# Patient Record
Sex: Male | Born: 1970 | Race: White | Hispanic: No | Marital: Married | Smoking: Current every day smoker
Health system: Southern US, Community
[De-identification: ages and names within clinical notes are randomized; demographics above are authoritative.]

## PROBLEM LIST (undated history)

## (undated) DIAGNOSIS — G4730 Sleep apnea, unspecified: Secondary | ICD-10-CM

## (undated) DIAGNOSIS — I219 Acute myocardial infarction, unspecified: Secondary | ICD-10-CM

## (undated) DIAGNOSIS — I10 Essential (primary) hypertension: Secondary | ICD-10-CM

## (undated) DIAGNOSIS — I509 Heart failure, unspecified: Secondary | ICD-10-CM

## (undated) DIAGNOSIS — E785 Hyperlipidemia, unspecified: Secondary | ICD-10-CM

## (undated) DIAGNOSIS — N529 Male erectile dysfunction, unspecified: Secondary | ICD-10-CM

## (undated) DIAGNOSIS — I2510 Atherosclerotic heart disease of native coronary artery without angina pectoris: Secondary | ICD-10-CM

## (undated) HISTORY — PX: CARDIAC CATHETERIZATION: SHX172

## (undated) HISTORY — DX: Heart failure, unspecified: I50.9

## (undated) HISTORY — DX: Sleep apnea, unspecified: G47.30

## (undated) HISTORY — DX: Atherosclerotic heart disease of native coronary artery without angina pectoris: I25.10

## (undated) HISTORY — DX: Essential (primary) hypertension: I10

## (undated) HISTORY — DX: Morbid (severe) obesity due to excess calories: E66.01

## (undated) HISTORY — DX: Male erectile dysfunction, unspecified: N52.9

## (undated) HISTORY — DX: Acute myocardial infarction, unspecified: I21.9

## (undated) HISTORY — PX: SEPTOPLASTY: SUR1290

## (undated) HISTORY — PX: CHOLECYSTECTOMY: SHX55

## (undated) HISTORY — DX: Hyperlipidemia, unspecified: E78.5

---

## 2003-08-24 ENCOUNTER — Other Ambulatory Visit: Payer: Self-pay

## 2003-11-03 ENCOUNTER — Emergency Department: Payer: Self-pay | Admitting: Emergency Medicine

## 2003-11-03 ENCOUNTER — Other Ambulatory Visit: Payer: Self-pay

## 2003-11-07 ENCOUNTER — Other Ambulatory Visit: Payer: Self-pay

## 2003-11-07 ENCOUNTER — Emergency Department: Payer: Self-pay | Admitting: Emergency Medicine

## 2003-11-08 ENCOUNTER — Ambulatory Visit: Payer: Self-pay | Admitting: Emergency Medicine

## 2003-12-26 ENCOUNTER — Emergency Department: Payer: Self-pay | Admitting: Emergency Medicine

## 2004-01-09 ENCOUNTER — Emergency Department: Payer: Self-pay | Admitting: Emergency Medicine

## 2004-03-07 ENCOUNTER — Emergency Department: Payer: Self-pay | Admitting: Emergency Medicine

## 2004-03-19 ENCOUNTER — Emergency Department: Payer: Self-pay | Admitting: Emergency Medicine

## 2004-03-19 ENCOUNTER — Other Ambulatory Visit: Payer: Self-pay

## 2004-06-25 ENCOUNTER — Other Ambulatory Visit: Payer: Self-pay

## 2004-06-25 ENCOUNTER — Emergency Department: Payer: Self-pay | Admitting: Emergency Medicine

## 2004-09-04 ENCOUNTER — Emergency Department: Payer: Self-pay | Admitting: Emergency Medicine

## 2004-09-04 ENCOUNTER — Other Ambulatory Visit: Payer: Self-pay

## 2004-09-07 ENCOUNTER — Other Ambulatory Visit: Payer: Self-pay

## 2004-09-07 ENCOUNTER — Emergency Department: Payer: Self-pay | Admitting: Emergency Medicine

## 2004-09-10 ENCOUNTER — Ambulatory Visit: Payer: Self-pay | Admitting: General Surgery

## 2004-09-12 ENCOUNTER — Ambulatory Visit: Payer: Self-pay | Admitting: General Surgery

## 2005-03-28 ENCOUNTER — Ambulatory Visit: Payer: Self-pay | Admitting: Cardiology

## 2005-03-28 ENCOUNTER — Inpatient Hospital Stay (HOSPITAL_COMMUNITY): Admission: EM | Admit: 2005-03-28 | Discharge: 2005-03-31 | Payer: Self-pay | Admitting: Emergency Medicine

## 2005-05-09 ENCOUNTER — Ambulatory Visit: Payer: Self-pay | Admitting: Internal Medicine

## 2005-06-10 ENCOUNTER — Ambulatory Visit: Payer: Self-pay | Admitting: Cardiology

## 2005-06-10 ENCOUNTER — Ambulatory Visit: Payer: Self-pay | Admitting: Internal Medicine

## 2005-07-05 ENCOUNTER — Ambulatory Visit: Payer: Self-pay | Admitting: Cardiology

## 2005-08-06 ENCOUNTER — Emergency Department: Payer: Self-pay | Admitting: Emergency Medicine

## 2005-09-12 ENCOUNTER — Ambulatory Visit: Payer: Self-pay | Admitting: Cardiology

## 2006-04-28 ENCOUNTER — Emergency Department: Payer: Self-pay | Admitting: Emergency Medicine

## 2007-02-27 ENCOUNTER — Ambulatory Visit: Payer: Self-pay | Admitting: Cardiology

## 2008-05-19 ENCOUNTER — Ambulatory Visit: Payer: Self-pay | Admitting: Cardiology

## 2008-08-13 DIAGNOSIS — I10 Essential (primary) hypertension: Secondary | ICD-10-CM | POA: Insufficient documentation

## 2008-08-13 DIAGNOSIS — I2510 Atherosclerotic heart disease of native coronary artery without angina pectoris: Secondary | ICD-10-CM | POA: Insufficient documentation

## 2008-08-13 DIAGNOSIS — E785 Hyperlipidemia, unspecified: Secondary | ICD-10-CM | POA: Insufficient documentation

## 2008-08-13 DIAGNOSIS — E669 Obesity, unspecified: Secondary | ICD-10-CM | POA: Insufficient documentation

## 2008-09-27 ENCOUNTER — Ambulatory Visit: Payer: Self-pay

## 2008-09-27 ENCOUNTER — Encounter: Payer: Self-pay | Admitting: Cardiology

## 2008-09-28 ENCOUNTER — Ambulatory Visit: Payer: Self-pay | Admitting: Cardiology

## 2008-09-28 DIAGNOSIS — R0602 Shortness of breath: Secondary | ICD-10-CM | POA: Insufficient documentation

## 2008-09-29 ENCOUNTER — Telehealth: Payer: Self-pay | Admitting: Cardiology

## 2008-10-21 ENCOUNTER — Encounter: Payer: Self-pay | Admitting: Cardiology

## 2008-10-26 ENCOUNTER — Encounter: Payer: Self-pay | Admitting: Cardiology

## 2008-10-26 ENCOUNTER — Ambulatory Visit: Payer: Self-pay | Admitting: Cardiovascular Disease

## 2008-10-28 LAB — CONVERTED CEMR LAB
Albumin: 3.9 g/dL (ref 3.5–5.2)
CO2: 25 meq/L (ref 19–32)
Calcium: 8.5 mg/dL (ref 8.4–10.5)
Chloride: 103 meq/L (ref 96–112)
Cholesterol: 123 mg/dL (ref 0–200)
Creatinine, Ser: 0.78 mg/dL (ref 0.40–1.50)
Glucose, Bld: 99 mg/dL (ref 70–99)
LDL Cholesterol: 72 mg/dL (ref 0–99)
Potassium: 4.9 meq/L (ref 3.5–5.3)
Total CHOL/HDL Ratio: 4.2
VLDL: 22 mg/dL (ref 0–40)

## 2009-05-29 ENCOUNTER — Encounter: Payer: Self-pay | Admitting: Cardiology

## 2009-10-12 ENCOUNTER — Telehealth: Payer: Self-pay | Admitting: Cardiology

## 2009-10-12 ENCOUNTER — Encounter: Payer: Self-pay | Admitting: Cardiology

## 2009-10-12 ENCOUNTER — Ambulatory Visit: Payer: Self-pay | Admitting: Cardiology

## 2009-10-13 ENCOUNTER — Ambulatory Visit: Payer: Self-pay | Admitting: Cardiology

## 2009-10-16 ENCOUNTER — Encounter: Payer: Self-pay | Admitting: Cardiology

## 2009-10-16 LAB — CONVERTED CEMR LAB
ALT: 44 units/L (ref 0–53)
Albumin: 4.1 g/dL (ref 3.5–5.2)
CO2: 22 meq/L (ref 19–32)
Calcium: 9.1 mg/dL (ref 8.4–10.5)
Glucose, Bld: 235 mg/dL — ABNORMAL HIGH (ref 70–99)
Potassium: 4.8 meq/L (ref 3.5–5.3)
Pro B Natriuretic peptide (BNP): 7.1 pg/mL (ref 0.0–100.0)
Sodium: 133 meq/L — ABNORMAL LOW (ref 135–145)
Total Bilirubin: 0.4 mg/dL (ref 0.3–1.2)
Total CHOL/HDL Ratio: 3.8

## 2009-10-18 ENCOUNTER — Ambulatory Visit: Payer: Medicare Other | Admitting: Family Medicine

## 2009-10-19 ENCOUNTER — Telehealth: Payer: Self-pay | Admitting: Cardiology

## 2009-10-26 ENCOUNTER — Ambulatory Visit: Payer: Medicare Other | Admitting: Gastroenterology

## 2009-11-15 ENCOUNTER — Encounter: Payer: Self-pay | Admitting: Cardiology

## 2009-11-20 ENCOUNTER — Telehealth: Payer: Self-pay | Admitting: Cardiovascular Disease

## 2009-11-21 ENCOUNTER — Telehealth: Payer: Self-pay | Admitting: Cardiology

## 2009-11-23 ENCOUNTER — Ambulatory Visit: Payer: Medicare Other | Admitting: Gastroenterology

## 2009-11-27 ENCOUNTER — Ambulatory Visit: Payer: Medicare Other | Admitting: Gastroenterology

## 2009-12-07 ENCOUNTER — Encounter: Payer: Self-pay | Admitting: Cardiovascular Disease

## 2009-12-12 ENCOUNTER — Ambulatory Visit: Payer: Self-pay | Admitting: Cardiology

## 2009-12-12 ENCOUNTER — Emergency Department: Payer: Medicare Other

## 2009-12-12 ENCOUNTER — Encounter: Payer: Self-pay | Admitting: Cardiology

## 2009-12-12 ENCOUNTER — Ambulatory Visit: Payer: Self-pay

## 2009-12-28 LAB — CONVERTED CEMR LAB
BUN: 9 mg/dL
CO2: 25 meq/L
Calcium: 9 mg/dL
Chloride: 102 meq/L
Creatinine, Ser: 0.67 mg/dL
Glucose, Bld: 155 mg/dL — ABNORMAL HIGH
Potassium: 4.6 meq/L
Sodium: 136 meq/L

## 2010-02-11 ENCOUNTER — Encounter: Payer: Self-pay | Admitting: Cardiology

## 2010-02-20 NOTE — Progress Notes (Signed)
Summary: MEDICATIONS  Phone Note Call from Patient Call back at [phone]   Caller: WIFE Call For: [facility] Summary of Call: PRIMARY DR WANTS PT TO STOP HYZZAR (?) AND ONLY TAKE LISINOPRIL Initial call taken by: Harlon Flor,  November 21, 2009 3:54 PM  Follow-up for Phone Call        Called and spoke with pt he could not give me any details of why [facility] wanted to stop hyzaar.  Spoke with Dr. Eldred Manges nurse at [facility] on 10/27 pt's BP was 152/89 and Hyzaar was not on pt's med list so at that visit pt recieved a prescription for lisinopril-HCTZ 10/12.5. Pharmancy called clinic to let them know the pt was already taking Hyzaar. Please advise what medications you want the pt to take.  Follow-up by: Benedict Needy, RN,  November 22, 2009 10:24 AM     Appended Document: MEDICATIONS Patient should take Coreg 25 mg two times a day and would increase his losartan/HCTZ (Hyzaar) to 100/25 mg daily (he was on 50/12.5 when I saw him in the office last).  He will need BMET and BP check in 2 wks.  Would encourage him to get his cardiac meds through our office.   Appended Document: MEDICATIONS LMOM TCB  Appended Document: MEDICATIONS Called spoke with pt advised per Dr Shirlee Latch to increase Hyzaar to 100/50mg  once daily, and to continue on Coreg 25mg  two times a day. Made appt for 12/12/09 for 2 week BMP and BP check.  Pt aware of appt and medication changes. Cloyde Reams RN  November 28, 2009 10:23 AM    Clinical Lists Changes  Medications: Changed medication from HYZAAR 50-12.5 MG TABS (LOSARTAN POTASSIUM-HCTZ) 1 tab by mouth daily to HYZAAR 100-25 MG TABS (LOSARTAN POTASSIUM-HCTZ) Take 1 tablet by mouth once a day - Signed Rx of HYZAAR 100-25 MG TABS (LOSARTAN POTASSIUM-HCTZ) Take 1 tablet by mouth once a day;  #30 x 6;  Signed;  Entered by: Cloyde Reams RN;  Authorized by: Marca Ancona, MD;  Method used: Electronically to [facility] [address] [place], [place], [place]  [address], Ph: [phone], Fax: [phone]    Prescriptions: HYZAAR 100-25 MG TABS (LOSARTAN POTASSIUM-HCTZ) Take 1 tablet by mouth once a day  #30 x 6   Entered by:   Cloyde Reams RN   Authorized by:   Marca Ancona, MD   Signed by:   Cloyde Reams RN on 11/28/2009   Method used:   Electronically to        [facility] [address] (retail)       [address]       [place], [place]  [address]       Ph: [phone]       Fax: [phone]   RxID:   [id number]

## 2010-02-20 NOTE — Assessment & Plan Note (Signed)
Summary: [facility]   Visit Type:  Follow-up Primary Provider:  Darreld Mclean, [facility]  CC:  Scheduled for colonoscopy & endoscopy for October 6 with [facility]  Patient has shortness of breath and chest pain when lying flat.Marland Kitchen  History of Present Illness: 40 yo with history of CAD s/p inferior MI, morbid obesity, and smoking presents for followup.  Patient has not been seen for over a year now.  He has been stable.  He gets short of breath after walking about 50 feet or going up a flight of steps.  He can get around his house without trouble.  No exertional chest pain. He does occasionally get chest burning when he is lying in bed at night that sounds more like GERD. He is still smoking about 1.5 ppd.  He reports generalized fatigue.  Sleep study in 10/10 showed only mild sleep apnea and weight loss was recommended. He has been having right red blood per rectum for a number of years but worse recently.  He needs a colonoscopy.    Labs (5/11): K 4.6, creatinine 0.78, HDL 25, LDL 91, TSH normal  ECG: NSR, normal  Current Medications (verified): 1)  Aspirin 81 Mg Tbec (Aspirin) .... Take One Tablet By Mouth Daily 2)  Plavix 75 Mg Tabs (Clopidogrel Bisulfate) .... Take One Tablet By Mouth Daily 3)  Coreg 12.5 Mg Tabs (Carvedilol) .Marland Kitchen.. 1 Tab By Mouth Twice A Day 4)  Hyzaar 50-12.5 Mg Tabs (Losartan Potassium-Hctz) .Marland Kitchen.. 1 Tab By Mouth Daily 5)  Lipitor 40 Mg Tabs (Atorvastatin Calcium) .... Take One Tablet By Mouth Daily. 6)  Chantix Starting Month Pak 0.5 Mg X 11 & 1 Mg X 42 Tabs (Varenicline Tartrate) .... Take As Directed 7)  Chantix Continuing Month Pak 1 Mg Tabs (Varenicline Tartrate) .... Take As Directed  Allergies (verified): No Known Drug Allergies  Past History:  Family History: Last updated: 08/13/2008 pt was adopted  Social History: Last updated: 09/28/2008 Disabled  Married  Tobacco Use - Yes, 1.5 ppd.  Alcohol Use -  no Regular Exercise - no Drug Use - no- prior use of cocaine and marijuana  Risk Factors: Exercise: no (08/13/2008)  Risk Factors: Smoking Status: current (08/13/2008)  Past Medical History: 1. Coronary artery disease.  The patient did have a myocardial infarction in 2004.  He was treated with thrombolytics at [facility].  He then had an inferior MI in March 2007.  He received a Horizon study stent 3.5 x 20 mm in the distal RCA at [facility]  The distal RCA was occluded on his heart catheterization at that time.  He had luminal irregularities only in the left coronary system.  EF was 65% with some basal inferior hypokinesis. 2. Morbid obesity. 3. Hypertension. 4. Probable obstructive sleep apnea. 5. Hyperlipidemia. 6. Tobacco abuse.  1-1/2 packs a day. 7. Prior substance abuse.  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  8. Possible diastolic CHF: Echo (9/10) with mild LVH, EF 60-65%, normal RV 9. Sleep study 2010 with mild OSA.   Family History: Reviewed history from 08/13/2008 and no changes required. pt was adopted  Social History: Reviewed history from 09/28/2008 and no changes required. Disabled  Married  Tobacco Use - Yes, 1.5 ppd.  Alcohol Use - no Regular Exercise - no Drug Use - no- prior use of cocaine and marijuana  Review of Systems       All systems reviewed and negative  except as per HPI.   Vital Signs:  Patient profile:   40 year old male Height:      71 inches Weight:      390 pounds BMI:     54.59 Pulse rate:   93 / minute BP sitting:   140 / 86  (left arm) Cuff size:   large  Vitals Entered By: Bishop Dublin, CMA (October 12, 2009 1:35 PM)  Physical Exam  General:  Well developed, well nourished, in no acute distress.  Morbidly obese.  Neck:  Neck thick, no JVD. No masses, thyromegaly or abnormal cervical nodes. Lungs:  Clear bilaterally to auscultation and percussion. Heart:  Non-displaced PMI, chest  non-tender; regular rate and rhythm, S1, S2 without murmurs, rubs or gallops. Carotid upstroke normal, no bruit. Pedals normal pulses. No edema, no varicosities. Abdomen:  Bowel sounds positive; abdomen soft and non-tender without masses, organomegaly, or hernias noted. No hepatosplenomegaly.  Morbidly obese.  Extremities:  No clubbing or cyanosis. Neurologic:  Alert and oriented x 3. Psych:  Normal affect.   Impression & Recommendations:  Problem # 1:  CAD, NATIVE VESSEL (ICD-414.01) No chest pain.  Stable exertional dyspnea.  I will have him continue current meds including ASA, Plavix, Coreg, ARB, and Lipitor.  He can stop Plavix for 5 days for colonoscopy (would restart afterwards unless lesion with significant bleeding risk is found).    Problem # 2:  DYSPNEA (ICD-786.05) I suspect that this is mainly due to morbid obesity and smoking.  I will have him get an echo.  He really needs to lose weight and quit smoking.  I will refer him to the bariatric clinic at [facility].   Problem # 3:  HYPERTENSION, BENIGN (ICD-401.1) BP running high.  Will increase Coreg to 25 mg two times a day.   Problem # 4:  HYPERLIPIDEMIA-MIXED (ICD-272.4) Last lipids were done when patient had been off Lipitor except for 1 week prior to testing.  Therefore, I will have him get a repeat set of fasting lipids now that he has been consistently taking Lipitor.    Other Orders: T-Lipid Profile [id number]) T-Hepatic Function [phone]) T-Basic Metabolic Panel [id number]) T-BNP  (B Natriuretic Peptide) [phone]) T-Magnesium [id number])  Patient Instructions: 1)  Your physician recommends that you return for a FASTING lipid profile: Tomorrow (LIP/LFT/BMP/BNP/MAG) 2)  Your physician has recommended you make the following change in your medication: INCREASE  carvedilol 25mg  daily  3)  Your physician wants you to follow-up in:   6 months You will receive a reminder letter in the mail two months in  advance. If you don't receive a letter, please call our office to schedule the follow-up appointment. 4)  You have been referred to Bariatric Surgery. Please call the bariatic surgery nurse at [phone] to find out the information session time. After yo have an appointment please call and let our office know.  Prescriptions: LIPITOR 40 MG TABS (ATORVASTATIN CALCIUM) Take one tablet by mouth daily.  #30 x 6   Entered by:   Benedict Needy, RN   Authorized by:   Marca Ancona, MD   Signed by:   Benedict Needy, RN on 10/12/2009   Method used:   Electronically to        [facility] [address] (retail)       [address]       [place], [place]  [address]       Ph: [phone]  Fax: [phone]   RxID:   [id number] XBMWUX 50-12.5 MG TABS (LOSARTAN POTASSIUM-HCTZ) 1 tab by mouth daily  #30 x 6   Entered by:   Benedict Needy, RN   Authorized by:   Marca Ancona, MD   Signed by:   Benedict Needy, RN on 10/12/2009   Method used:   Electronically to        [facility] [address] (retail)       [address]       [place], [place]  [address]       Ph: [phone]       Fax: [phone]   RxID:   [id number] PLAVIX 75 MG TABS (CLOPIDOGREL BISULFATE) Take one tablet by mouth daily  #30 x 6   Entered by:   Benedict Needy, RN   Authorized by:   Marca Ancona, MD   Signed by:   Benedict Needy, RN on 10/12/2009   Method used:   Electronically to        [facility] [address] (retail)       [address]       [place], [place]  [address]       Ph: [phone]       Fax: [phone]   RxID:   [id number] CARVEDILOL 25 MG TABS (CARVEDILOL) Take one tablet by mouth twice a day  #60 x 6   Entered by:   Benedict Needy, RN   Authorized by:   Marca Ancona, MD   Signed by:   Benedict Needy, RN on 10/12/2009   Method used:   Electronically to         [facility] [address] (retail)       [address]       [place], [place]  [address]       Ph: [phone]       Fax: [phone]   RxID:   [id number]   Appended Document: Orders Update    Clinical Lists Changes  Orders: Added new Referral order of Echocardiogram (Echo) - Signed

## 2010-02-20 NOTE — Letter (Signed)
Summary: Medical Record Release  Medical Record Release   Imported By: Harlon Flor 12/11/2009 09:52:51  _____________________________________________________________________  External Attachment:    Type:   Image     Comment:   External Document

## 2010-02-20 NOTE — Progress Notes (Signed)
Summary: RX  Phone Note Refill Request Call back at [phone] Message from:  Patient on October 19, 2009 11:54 AM  Refills Requested: Medication #1:  PLAVIX 75 MG TABS Take one tablet by mouth daily  Medication #2:  LIPITOR 40 MG TABS Take one tablet by mouth daily.  Medication #3:  HYZAAR 50-12.5 MG TABS 1 tab by mouth daily  Medication #4:  CHANTIX STARTING MONTH PAK 0.5 MG X 11 & 1 MG X 42 TABS take as directed COREG HAS A CHANGE IN THE DOSAGE-[facility] [address]  Initial call taken by: Harlon Flor,  October 19, 2009 11:55 AM    Prescriptions: LIPITOR 40 MG TABS (ATORVASTATIN CALCIUM) Take one tablet by mouth daily.  #30 x 6   Entered by:   Bishop Dublin, CMA   Authorized by:   Marca Ancona, MD   Signed by:   Bishop Dublin, CMA on 10/19/2009   Method used:   Electronically to        [facility] [address] (retail)       [address]       [place], [place]  [address]       Ph: [phone]       Fax: [phone]   RxID:   [id number] HYZAAR 50-12.5 MG TABS (LOSARTAN POTASSIUM-HCTZ) 1 tab by mouth daily  #30 x 6   Entered by:   Bishop Dublin, CMA   Authorized by:   Marca Ancona, MD   Signed by:   Bishop Dublin, CMA on 10/19/2009   Method used:   Electronically to        [facility] [address] (retail)       [address]       [place], [place]  [address]       Ph: [phone]       Fax: [phone]   RxID:   [id number] PLAVIX 75 MG TABS (CLOPIDOGREL BISULFATE) Take one tablet by mouth daily  #30 x 6   Entered by:   Bishop Dublin, CMA   Authorized by:   Marca Ancona, MD   Signed by:   Bishop Dublin, CMA on 10/19/2009   Method used:   Electronically to        [facility] [address] (retail)       [address]       [place], [place]  [address]       Ph: [phone]       Fax: [phone]   RxID:    [id number]

## 2010-02-20 NOTE — Progress Notes (Signed)
Summary: MEDICATION CONFUSION  Phone Note Call from Patient Call back at [phone]   Caller: SELF Call For: Jesse [name] Summary of Call: PT NEEDED COREG REFILLED-PRIMARY DOCTOR REFILLED IT-THERE IS CONFUSION ABOUT DOSAGE-THE PRIMARY DR PUT THAT THE MEDICATION WAS TO BE TAKEN 1 TIME A DAY-THEY WERE UNDER THE IMPRESSION THAT IT SHOULD BE 2 TIMES A DAY Initial call taken by: Harlon Flor,  November 20, 2009 3:20 PM  Follow-up for Phone Call        [facility] TCB Benedict Needy, RN  November 20, 2009 5:07 PM   [facility] TCB Benedict Needy, RN  November 22, 2009 9:50 AM     Prescriptions: CARVEDILOL 25 MG TABS (CARVEDILOL) Take one tablet by mouth twice a day  #60 x 6   Entered by:   Benedict Needy, RN   Authorized by:   Dossie Arbour MD   Signed by:   Benedict Needy, RN on 11/22/2009   Method used:   Electronically to        [facility] [address] (retail)       [address]       [place], [place]  [address]       Ph: [phone]       Fax: [phone]   RxID:   [id number]

## 2010-02-20 NOTE — Progress Notes (Signed)
Summary: ECHO  Phone Note Call from Patient Call back at [phone]   Caller: WIFE Call For: [facility] Summary of Call: WIFE STATES THAT DR [facility] MENTIONED THE PT GETTING AN ECHO-I DO NOT SEE ANYTHING ABOUT IT IN THE NOTE Initial call taken by: Harlon Flor,  October 19, 2009 11:56 AM  Follow-up for Phone Call        Greasy to schedule pt Benedict Needy, RN  October 19, 2009 12:15 PM

## 2010-02-20 NOTE — Letter (Signed)
Summary: Custom - Lipid  Architectural technologist at [facility] [address]   [place], [place] [address]   Phone: [phone]  Fax: [phone]     October 16, 2009 MRN: [id number]   [facility] [address] [place], [place]  [address]   Dear Mr. Degregorio,  We have reviewed your cholesterol results.  They are as follows:     Total Cholesterol:    111 (Desirable: less than 150)       HDL  Cholesterol:     29  (Desirable: greater than 40 for men and 50 for women)       LDL Cholesterol:       51  (Desirable: less than 100 for low risk and less than 70 for moderate to high risk)       Triglycerides:       155  (Desirable: less than 150)  Our recommendations include: No recommendations at this time   Call our office at the number listed above if you have any questions.  Lowering your LDL cholesterol is important, but it is only one of a large number of "risk factors" that may indicate that you are at risk for heart disease, stroke or other complications of hardening of the arteries.  Other risk factors include:   A.  Cigarette Smoking* B.  High Blood Pressure* C.  Obesity* D.   Low HDL Cholesterol (see yours above)* E.   Diabetes Mellitus (higher risk if your is uncontrolled) F.  Family history of premature heart disease G.  Previous history of stroke or cardiovascular disease    *These are risk factors YOU HAVE CONTROL OVER.  For more information, visit .  There is now evidence that lowering the TOTAL CHOLESTEROL AND LDL CHOLESTEROL can reduce the risk of heart disease.  The American Heart Association recommends the following guidelines for the treatment of elevated cholesterol:  1.  If there is now current heart disease and less than two risk factors, TOTAL CHOLESTEROL should be less than 200 and LDL CHOLESTEROL should be less than 100. 2.  If there is current heart disease or two or more risk factors, TOTAL CHOLESTEROL should be less than 200 and LDL  CHOLESTEROL should be less than 70.  A diet low in cholesterol, saturated fat, and calories is the cornerstone of treatment for elevated cholesterol.  Cessation of smoking and exercise are also important in the management of elevated cholesterol and preventing vascular disease.  Studies have shown that 30 to 60 minutes of physical activity most days can help lower blood pressure, lower cholesterol, and keep your weight at a healthy level.  Drug therapy is used when cholesterol levels do not respond to therapeutic lifestyle changes (smoking cessation, diet, and exercise) and remains unacceptably high.  If medication is started, it is important to have you levels checked periodically to evaluate the need for further treatment options.  Thank you,    [employer] Team

## 2010-02-20 NOTE — Letter (Signed)
Summary: Medical Record Release  Medical Record Release   Imported By: Harlon Flor 11/16/2009 11:23:25  _____________________________________________________________________  External Attachment:    Type:   Image     Comment:   External Document

## 2010-02-20 NOTE — Progress Notes (Signed)
Summary: Surgical Clearance  ---- Converted from flag ---- ---- 10/10/2009 9:55 PM, Marca Ancona, MD wrote: I have not seen him in a year. It looks like his last stent was in 2007.  If he is asymptomatic, he should be ok to stop Plavix for 5 days prior to procedure but would continue ASA 81 mg daily.  Most gastroenterologists will do scope on patient getting baby ASA.   ---- 10/10/2009 5:19 PM, Benedict Needy, RN wrote: [facility] is asking for cardiac clearance: Stop plavix and aspirin for 5 days prior to EGD and colonscopy. Please advise. ------------------------------

## 2010-05-31 ENCOUNTER — Ambulatory Visit: Payer: Self-pay | Admitting: Cardiology

## 2010-06-05 ENCOUNTER — Emergency Department: Payer: Medicare Other | Admitting: Emergency Medicine

## 2010-06-05 NOTE — Assessment & Plan Note (Signed)
[facility] OFFICE NOTE   Jesse [name], Jesse [name]                       MRN:          [id number]  DATE:05/19/2008                            DOB:          1970/10/12    PRIMARY CARE PHYSICIAN:  Dr. Darreld Mclean  [facility], [place].   HISTORY OF PRESENT ILLNESS:  This is a 40 year old with a history of  coronary artery disease status post an MI in 2004 treated with lytics  and inferior MI in 2007 treated with drug-eluting stent to the distal  RCA who presents to Cardiology Clinic to reestablish care.  He was last  seen in our office in 2007, and has been lost to follow up since then.  He actually stopped all his medicines about a year ago.  He established  primary care physician at the [facility] recently.  He did see Dr.  Marvis Moeller on May 11, 2008.  He was started back on his Plavix, Lipitor,  aspirin, and carvedilol.  He was also given a prescriptionof Chantix to  try to quit smoking.  The patient states that he has been stable from a  standpoint of cardiopulmonary symptoms.  He has not had any episodes of  chest pain with exertion.  He gets very slight, fleeting sharp central  chest pain that only lasts for 4 or 5 seconds.  He says it comes on  occasionally, usually when he gets aggravated by his children, never  comes on with exertion, never lasts more than a few seconds.  The  patient does report some shortness of breath when he climbs a flight of  steps.  He is not significantly short of breath on flat ground.  He does  continue smoke to about 2 packs a day and he continues to be morbidly  obese.  The patient does also continue to be very sleepy during the day.  He falls asleep easily when he is not active.  He snores loudly at night  and according to his wife, he stops breathing occasionally at night.  He  has been referred for sleep studies in the past, however, he has been  referred to [facility] and he  lives in [place] and has not been able  to get transportation to go to [place].  The patient does deny use of  illicit drugs such as cocaine, he did use in the past, but he is not  doing that now.  The patient's blood pressure today is 140/90.  He was  around 170/100, when he was seen by Dr. Marvis Moeller on the May 11, 2008  since then he is started back on carvedilol.   EKG shows normal sinus rhythm with an old inferior MI.   PAST MEDICAL HISTORY:  1. Coronary artery disease.  The patient did have a myocardial      infarction in 2004.  He was treated with thrombolytics at [facility].  He then had an inferior MI in March 2007.      He received a Horizon study stent 3.5 x 20  mm in the distal RCA at      [facility].  The distal RCA was occluded on his heart      catheterization at that time.  He had luminal irregularities only      in the left coronary system.  EF was 65% with some basal inferior      hypokinesis.  2. Morbid obesity.  3. Hypertension.  4. Probable obstructive sleep apnea.  5. Hyperlipidemia.  6. Tobacco abuse.  2 and 2-1/2 packs a day.  7. Prior substance abuse.  The patient quit cocaine and marijuana back      in 1998.   SOCIAL HISTORY:  The patient lives in [place].  He is married.  He  has 5 kids.  He is unemployed and on disability.  The patient continues  to smoke 2-2-1/2 packs of cigarettes a day.  He is not drinking alcohol  or use any illicit drugs now.  He did use cocaine and marijuana in the  past.   FAMILY HISTORY:  Not able to obtain.  This patient was adopted.   REVIEW OF SYSTEMS:  All systems were reviewed and were negative except  as noted in the history of present illness.   MEDICATIONS:  1. Aspirin 325 mg daily.  2. Chantix.  3. Plavix 75 mg daily.  4. Lipitor 20 mg daily.  5. Carvedilol 12.5 mg b.i.d.  6. Fish oil.   PHYSICAL EXAMINATION:  VITAL SIGNS:  Blood pressure 140/90, heart rate  80 and regular.   Weight is 386 pounds.  GENERAL:  This is a morbidly obese male, in no apparent stress.  NEUROLOGIC:  Alert and oriented x3.  Normal affect.  LUNGS:  Clear to auscultation bilaterally with normal respiratory  effort.  CARDIOVASCULAR:  Heart regular S1 and S2.  Soft S4.  No S3.  There is no  murmur.  There is no carotid bruit.  There are 2+ posterior tibial  pulses bilaterally.  There is no peripheral edema.  NECK:  The patient's neck is quite thick.  There is no JVD.  There is no  thyromegaly or thyroid nodule.  SKIN:  The patient has multiple tattoos.  MUSCULOSKELETAL:  Normal exam.  EXTREMITIES:  No clubbing or cyanosis.  HEENT:  Normal exam.   ASSESSMENT AND PLAN:  This is a 40 year old with morbid obesity and  history of coronary artery disease status post myocardial infarction x2  who returns to Cardiology Clinic to reestablish care.  1. Coronary artery disease.  The patient has had no ischemic type      chest pain in a number of years.  His last intervention was in 2007      when he had a Horizon study stent placed in the occluded distal      RCA.  He did not have any significant disease in his left coronary      at that time.  EF was preserved.  The patient gets only very      fleeting sharp chest pain on occasion, this does not sound      ischemic.  At this time, we will work on getting him back on a      stable medical regimen.  He should continue on Plavix 75 mg daily.      He can decrease his aspirin to 81 mg daily.  I would like him to      continue his carvedilol 12.5 mg twice a day and we are going to  start him back on his ARB.  He used to take Hyzaar 50/12.5 daily.      I am going to go ahead and start him back on that.  Finally, the      patient does report some shortness of breath with exertion.  I      think this has probably more to do with his morbid obesity and his      smoking than it does with a heart failure type symptoms.  However,      we will do an  echocardiogram to assess his left ventricular      function.  2. Hypertension.  The patient's blood pressure is 140/90 today.  He is      started back on carvedilol 12.5 mg twice a day.  I am also going to      have him start back on his Hyzaar 50/12.5 mg daily.  He will follow      up for blood pressure check in 2 weeks and we will check his chem-7      at that time as well.  3. Hyperlipidemia.  The patient was on Lipitor 80 mg three years ago      when he was last seen in our office.  I think we will go ahead and      increase his Lipitor today from 20 to 40 mg.  We will check his      liver functions and his lipids in 3 months.  4. Smoking.  The patient is an active smoker.  I did counsel him to      quit.  He has been given a prescription for Chantix by Dr. Marvis Moeller,      which he plans on filling today.  5. Obstructive sleep apnea.  The patient does screen strongly positive      for sleep apnea.  He has missed several sleep studies in the past,      according to him because they were scheduled in [place], and he      does not transportation to get over there.  We will call and set      him up with a sleep study here in [place].  I think it is very      important for him to do this, as this is a strong contributor to      cardiovascular health.  I would like him to be on CPAP almost      certainly.  6. I did ask the patient to increase his exercise level.  He does have      treadmill that he bought recently.  It is at home now.  I asked him      to walk for half an hour in the morning, half an hour in the      evening to start with.  He does need to lose considerable amount of      weight.     Marca Ancona, MD  Electronically Signed    DM/MedQ  DD: 05/19/2008  DT: 05/20/2008  Job #: [id number]   cc:   Darreld Mclean

## 2010-06-08 NOTE — Cardiovascular Report (Signed)
Jesse [name], Jesse [name]              ACCOUNT NO.:  [id number]   MEDICAL RECORD NO.:  [id number]          PATIENT TYPE:  INP   LOCATION:  2807                         FACILITY:  [facility]   PHYSICIAN:  Salvadore Farber, M.D. LHCDATE OF BIRTH:  1970-09-01   DATE OF PROCEDURE:  03/28/2005  DATE OF DISCHARGE:                              CARDIAC CATHETERIZATION   PROCEDURE:  Left heart catheterization, left ventriculography, coronary  angiography, Horizon study stent to the distal RCA, StarClose closure of  both the right common femoral artery and the right common femoral vein.   INDICATIONS:  Jesse [name] is a 40 year old gentleman with hypertension and  ongoing tobacco abuse as well as morbid obesity. He states he has had a  prior myocardial infarction treated with lytic therapy, and no prior cardiac  catheterization. He presents, tonight, having developed substernal chest  discomfort this evening. He presented via EMS to the [facility] ER where  electrocardiogram demonstrated inferior ST elevations. He was  hemodynamically stable. He was treated with aspirin, 600 mg of Plavix,  heparin, and nitroglycerin without relief of his pain. He was then brought  emergently to cardiac catheterization lab for angiography with an eye to  percutaneous coronary revascularization.   PROCEDURAL TECHNIQUE:  Informed consent was obtained. Under 1% lidocaine  local anesthesia, a 6-French sheath was placed in the right common femoral  artery using modified Seldinger technique. Diagnostic angiography was  performed using JL-4 and JR-4 catheters. This demonstrated the culprit  lesion to be an occlusion of the distal RCA. The patient was consented to  proceed in the Horizon trial. This compares heparin plus glycoprotein 2b/3a  inhibitor versus bivalirudin, as well as bare metal versus Taxus drug-  eluting stent. He was randomized to the heparin, and heparin and  eptifibatide arm. He had received 5000 units of  heparin in the emergency  room. He required a total of the 8500 units of heparin to just achieve an  ACT of 200 seconds. Double bolus eptifibatide was administered.   A JR-4 guide was advanced over a wire and engaged in the ostium of the RCA.  A Prowater wire was advanced beyond the occlusion into the PDA. Lesion was  predilated using a 2.5 x 15 mm Maverick at 6 atmospheres. This resulted in  TIMI 3 flow.   With the restoration of flow, the patient had transient asystole. He was  maintained with cough through this period. While atropine was administered.  A 6-French, venous sheath was placed to facilitate placement of a  transvenous pacemaker. Ultimately, the pacemaker was not required; and was  not placed. Heart rate and hemodynamics stabilized with restoration of sinus  rhythm in the administration of bolus of fluid.   The lesion was then stented using a 3.5 x 20 mm Horizon study stent deployed  a 16 atmospheres. The stent was then postdilated using a 3.75 x 18 mm  POWERSAIL at 16 atmospheres. Final angiography demonstrated no residual  stenosis, no dissection, TIMI 3 flow, and a blush score of 3. The patient  tolerated the procedure well.   I then performed a left  heart catheterization and ventriculography using a  pigtail catheter. I then administered 1 gram of Ancef. I closed both the  arteriotomy and the venotomy using StarClose devices. Complete hemostasis  was obtained. He was then transferred to the intensive care unit in stable  condition, having tolerated the procedure well.   COMPLICATIONS:  None.   FINDINGS:  1.  LV: 95/6/16. EF 65% with inferior hypokinesis.  2.  No aortic stenosis or mitral regurgitation.  3.  Left main:  Angiographically normal.  4.  LAD:  Moderate-sized vessel giving rise to a single diagonal. It has      minor luminal irregularities.  5.  Ramus intermedius:  Large vessel. It is angiographically normal.  6.  Circumflex:  Large codominant vessel  giving rise to an obtuse marginal      and multiple posterior left ventricular branches, and a PDA. It has      minor luminal irregularities.  7.  RCA:  Moderate sized codominant vessel. It was occluded before the      takeoff of the PDA. This was stented with no residual. There is no other      significant disease.   TIMES:  1.  Pain onset 1945.  2.  Jesse [name] ER arrival 2101.  3.  Cath lab arrival 2147.  4.  Reperfusion 2206.   IMPRESSION/PLAN:  Successful revascularization of the occluded distal RCA.  There is no other significant coronary disease. Ejection fraction remains  preserved. He should be treated with aspirin and Plavix indefinitely. Will  increase dose of his beta blocker and initiate statin. Smoking cessation has  been strongly advised.      Salvadore Farber, M.D. [facility]  Electronically Signed     WED/MEDQ  D:  03/28/2005  T:  03/29/2005  Job:  [id number]

## 2010-06-08 NOTE — H&P (Signed)
Jesse [name], Jesse [name]              ACCOUNT NO.:  [id number]   MEDICAL RECORD NO.:  [id number]          PATIENT TYPE:  INP   LOCATION:  2807                         FACILITY:  [facility]   PHYSICIAN:  Jonelle Sidle, M.D. LHCDATE OF BIRTH:  1970-05-21   DATE OF ADMISSION:  03/28/2005  DATE OF DISCHARGE:                                HISTORY & PHYSICAL   PRIMARY CARDIOLOGIST:  Dr. Marcina Millard   PATIENT PROFILE:  40 year old white male with prior history of MI in 2004  who presents with inferior ST elevation MI.   PROBLEM LIST:  1.  Inferior ST elevation myocardial infarction.      1.  History of myocardial infarction in 2004 status post lytic therapy          per patient.  Patient believes catheterization at that time showed          insignificant disease.  2.  Hypertension.  3.  Morbid obesity.  4.  Tobacco abuse.  5.  Remote marijuana and cocaine abuse quitting in 1998.   HISTORY OF PRESENT ILLNESS:  40 year old white male with history of MI in  2004 status post lytics (per patient) at [facility].  He was told  catheterization was okay.  He had done well since then until earlier this  week when he experienced three or four episodes of exertional jaw and neck  pain that were relieved with rest.  Tonight at approximately 8 p.m. he had  sudden onset of 8/10 substernal chest heaviness with radiation to his jaw  with mild shortness of breath.  He denied any nausea, diaphoresis.  After  approximately five minutes of persistent symptoms he called EMS and upon  their arrival he was treated with a sublingual nitroglycerin spray without  any significant relief and discomfort.  ECG revealed inferior ST segment  elevation with reciprocal changes laterally.  He was taken by EMS to the  [facility] ED and arrived at approximately 9 p.m.  On arrival he complained  of 8/10 chest pain and with continued ST elevation inferiorly with  reciprocal changes laterally.  He was given heparin bolus  of 5000 units and  initiated on a drip at 1200 units an hour.  He was started on IV  nitroglycerin initially at 20 mcg increased to 30 mcg as well as treated  with 4 mg of IV morphine, 5 mg of IV Lopressor, 600 mg of Plavix.  He  received four aspirin in transit by EMS.  There were two acute MIs in the  catheterization laboratory in progress at the time of this patient's  arrival.  Dr. Diona Browner spoke with laboratory staff and Dr. Riley Kill and Dr.  Samule Ohm was called in.  Immediately upon his arrival Mr. Semmel was taken to  the catheterization laboratory at 2133.   FAMILY HISTORY:  Patient was adopted and does not know anything about his  biological parents.   SOCIAL HISTORY:  Lives in [place] with his fiancee.  He is unemployed.  He  smokes 10 cigars a day and has done so for about 18 years.  He used to  drink, but has not had a drink in years.  Used to use marijuana and cocaine,  but quit that in 1998.   REVIEW OF SYSTEMS:  Positive for chest pain and shortness of breath.  All  other systems reviewed and negative.   PHYSICAL EXAMINATION:  VITAL SIGNS:  He is afebrile.  He is 95, respirations  22, blood pressure 129/71, pulse ox 96% on a facemask.  GENERAL:  Pleasant white male in mid distress.  He is awake, alert, oriented  x3.  NECK:  Obese.  Difficult to assess JVP.  There is no bruits.  LUNGS:  Respirations regular, unlabored, clear to auscultation.  CARDIAC:  Regular S1, S2.  No S3, S4, murmurs.  ABDOMEN:  Round, soft, nontender, nondistended.  Bowel sounds present x4.  EXTREMITIES:  Warm, dry, pink.  No clubbing, cyanosis, edema.  Dorsalis  pedis, posterior tibial pulses 2+ and equal bilaterally.  No femoral bruits  are noted.   Chest x-ray is pending.  EKG shows sinus rhythm with a rate of 95 and normal  axis.  He has 4-5 mm ST elevation in leads, 2, 3, and aVF, 3-4 mm ST segment  depression in 1 and aVL.  All of his laboratory work are pending.   ASSESSMENT/PLAN:  1.  Acute  inferior myocardial infarction.  The patient is taken emergently      to cardiac catheterization laboratory where catheterization was      performed by Dr. Samule Ohm.  Will plan to continue beta blocker as well as      aspirin and add Statin, ACE inhibitor.  More than likely patient will      require PCI of the right coronary artery.  2.  Hypertension.  Continue current regimen.  Add ACE inhibitor.  3.  Hyperlipidemia.  I will check lipids and LFTs.  Adding Lipitor 80.  4.  Tobacco abuse.  Smoking cessation strongly advised.  Will obtain a      consult.  5.  Morbid obesity.  I will have cardiac rehabilitation see him.      Ok Anis, NP    ______________________________  Jonelle Sidle, M.D. [facility]    CRB/MEDQ  D:  03/28/2005  T:  03/29/2005  Job:  [id number]

## 2010-06-08 NOTE — Discharge Summary (Signed)
[name], [name]              ACCOUNT NO.:  [id number]   MEDICAL RECORD NO.:  [id number]          PATIENT TYPE:  INP   LOCATION:  3736                         FACILITY:  [facility]   PHYSICIAN:  Pricilla Riffle, M.D.    DATE OF BIRTH:  06/10/1970   DATE OF ADMISSION:  03/28/2005  DATE OF DISCHARGE:  03/31/2005                                 DISCHARGE SUMMARY   PRIMARY CARDIOLOGIST:  Dr. Randa Evens.   PRIMARY CARE PHYSICIAN:  No primary care physician known at this time.   DISCHARGING DIAGNOSES:  1.  Coronary artery disease, status post inferior ST-elevation myocardial      infarction, status post cardiac catheterization by Dr. Samule Ohm on March 28, 2005, patient with Horizon Study stent to the right coronary artery      with aspirin and Plavix indefinitely.  2.  History of coronary artery disease, status post myocardial infarction in      2004 with lytic therapy per patient.  3.  Hypertension.  4.  Morbid obesity.  5.  Ongoing tobacco abuse.  6.  Remote history of marijuana and cocaine abuse, quitting in 1998.   PROCEDURES:  Cardiac catheterization on March 28, 2005 by Dr. Randa Evens  with intervention as stated above.   HOSPITAL COURSE:  Mr. Jesse [name] is a 40 year old Caucasian gentleman with  history of coronary artery disease, status post lytics per patient at  [facility] in 2004, no further problems until this admission, when  the patient states he began having chest discomfort the evening prior to  admission.  The patient presented to [facility] Emergency Room via EMS,  initial EKG showing continued ST elevation inferiorly with reciprocal  changes laterally.  He was given a heparin bolus and started on a drip along  with nitroglycerin.  He was loaded with 600 mg of Plavix, IV Lopressor and  morphine.  He also received baby aspirin in transit by EMS.  The patient was  taken to the cath lab urgently, results as stated above.  The patient  tolerated the procedure  without complications, transferred to telemetry  unit, cardiac rehabilitation and smoking cessation initiated, patient  tolerating medication titration.  Dr. Tenny Craw in to see patient on day of  discharge, heart rhythm stable, cath site stable, patient being discharged  home to follow up with Dr. Samule Ohm or extender within 2 weeks; the patient  agrees to call office on Monday to schedule an appointment.   LABORATORY WORK AT DISCHARGE:  Hemoglobin 14.5, hematocrit 43.1, potassium  4.0, BUN 6, creatinine 0.8.  Lipid panel this admission:  Total cholesterol  157, triglycerides 164, HDL 22 and LDL 102.   MEDICATIONS AT TIME OF DISCHARGE:  1.  Aspirin 325 mg daily.  2.  Plavix 75 mg daily.  3.  Lipitor 80 mg daily.  4.  Chantix per starter pack.  5.  Coreg 25 mg p.o. twice daily  6.  He also has a prescription for nitroglycerin for chest discomfort as      needed.   DISCHARGE INSTRUCTIONS:  The patient has been given  the post-cardiac-  catheterization discharge instructions along with post-myocardial-infarction  contract.   FOLLOWUP:  He is to follow up with our office within the next 2 weeks.  He  is to call our office for any problems from the cath site prior to this.   DURATION OF DISCHARGE ENCOUNTER:  Forty-five minutes.      Dorian Pod, NP    ______________________________  Pricilla Riffle, M.D.    MB/MEDQ  D:  03/31/2005  T:  04/02/2005  Job:  [id number]

## 2010-06-08 NOTE — Assessment & Plan Note (Signed)
[facility]                              CARDIOLOGY OFFICE NOTE   [name], [name]                       MRN:          [id number]  DATE:09/12/2005                            DOB:          Jan 14, 1971    HISTORY OF PRESENT ILLNESS:  [name] is a 40 year old gentleman who  suffered a non-ST elevation myocardial infarction in March 2007.  I placed a  drug-eluting stent in the culprit lesion in the right coronary artery.  Ejection fraction is 65%.   Since his MI he has generally done well.  However, he has returned to  smoking and is getting no exercise.  We have set him up for a sleep study  twice that he has not shown up for.  He says this is due to a lack of  transportation, which has now been addressed.  He complains of some chest  discomfort.  On detailed questioning, this is not actually pain but  awakening and needing to take a deep breath.  It actually sounds like he is  apneic and is becoming consciously awakened by these.   CURRENT MEDICATIONS:  1. Aspirin 325 mg once per day.  2. Plavix 75 mg per day.  3. Lipitor 80 mg per day.  4. Coreg 50 mg twice per day.  5. Hyzaar 50/12.5 mg one per day.   PHYSICAL EXAMINATION:  GENERAL:  He is morbidly obese, in no distress.  VITAL SIGNS:  Heart rate 76, blood pressure 148/82 and a weight of 382  pounds.  Weight is up 6 pounds from May.  NECK:  He has no jugular venous distention, thyromegaly or lymphadenopathy.  LUNGS:  Clear to auscultation.  CARDIAC:  He has a nonpalpable point of maximal cardiac impulse.  There is a  regular rate and rhythm without murmur, rub, or gallop.  ABDOMEN:  Obese.  Unable to assess for hepatosplenomegaly.  It is nontender  and there are normal bowel sounds.  EXTREMITIES:  Warm without edema or ulceration.  VASCULAR:  Carotid pulses are 2+ bilaterally without bruit.   ASSESSMENT/RECOMMENDATIONS:  1. Coronary disease, status post inferior myocardial infarction  March      2007.  Drug-eluting stent in the right coronary.  Continue aspirin and      Plavix indefinitely.  2. Normal left ventricular systolic function.  3. Hypertension.  Still poor control.  I suspect this has much to do with      his obesity and probable obstructive sleep apnea.  Will increase his      Hyzaar to 100/25 mg.  4. Probable sleep apnea.  Refer him back to the lab.  The patient promises      he will go this      time.  5. Hypercholesterolemia.  Continue Lipitor.                                 Jesse Farber, MD    WED/MedQ  DD:  09/12/2005  DT:  09/13/2005  Job #:  [id number]  cc:   Jesse [name]

## 2011-08-13 ENCOUNTER — Ambulatory Visit (INDEPENDENT_AMBULATORY_CARE_PROVIDER_SITE_OTHER): Payer: Medicare Other | Admitting: Cardiovascular Disease

## 2011-08-13 ENCOUNTER — Encounter: Payer: Self-pay | Admitting: Cardiovascular Disease

## 2011-08-13 VITALS — BP 150/92 | HR 70 | Ht 71.0 in | Wt 316.0 lb

## 2011-08-13 VITALS — BP 150/92 | HR 70 | Ht 71.0 in | Wt 316.5 lb

## 2011-08-13 DIAGNOSIS — I10 Essential (primary) hypertension: Secondary | ICD-10-CM

## 2011-08-13 DIAGNOSIS — E785 Hyperlipidemia, unspecified: Secondary | ICD-10-CM

## 2011-08-13 DIAGNOSIS — R0602 Shortness of breath: Secondary | ICD-10-CM

## 2011-08-13 DIAGNOSIS — I2510 Atherosclerotic heart disease of native coronary artery without angina pectoris: Secondary | ICD-10-CM

## 2011-08-13 NOTE — Assessment & Plan Note (Signed)
The patient overall seems to be stable. He denies any chest pain but does complain of exertional dyspnea. A treadmill stress test was performed today which showed no evidence of ischemia at slightly submaximal workload. I recommend continuing medical therapy. Given that his cardiac condition has been stable and he is not on long acting nitrates, a phosphodiesterase inhibitor can be used for erectile dysfunction. Regarding whether NSAIDs can be used for pain management, I advise an alternative if possible such as acetaminophen given that he is on both aspirin and Plavix.

## 2011-08-13 NOTE — Assessment & Plan Note (Signed)
Continue treatment with atorvastatin. Target LDL is less than 70.

## 2011-08-13 NOTE — Procedures (Signed)
    Treadmill Stress test  Indication: Exertional dyspnea with known history of coronary artery disease.  Baseline Data:  Resting EKG shows NSR with rate of 79 bpm, no significant ST changes. Resting blood pressure of 150/92 mm Hg Stand bruce protocal was used.  Exercise Data:  Patient exercised for 7 min 16 sec,  Peak heart rate of 129 bpm.  This was 72 % of the maximum predicted heart rate. No symptoms of chest pain or lightheadedness were reported at peak stress or in recovery.  Peak Blood pressure recorded was 168/64 Maximal work level: 10.1 METs.  Heart rate at 3 minutes in recovery was 98 bpm. BP response: Normal HR response: Blunted due to being on a beta blocker.  EKG with Exercise: Sinus tachycardia with no significant ST changes.  FINAL IMPRESSION: Normal exercise stress test at submaximal workload. No significant EKG changes concerning for ischemia. Fair exercise tolerance.  Recommendation: Continue medical therapy. Smoking cessation is strongly advised.

## 2011-08-13 NOTE — Assessment & Plan Note (Signed)
I will obtain an echocardiogram to evaluate his LV systolic function

## 2011-08-13 NOTE — Progress Notes (Signed)
HPI  This is a 41 year old man who is here today to reestablish cardiovascular care. He was referred by Dr. Marvis Moeller for consultation. He used to followup with Dr. Shirlee Latch. He has known history of coronary artery disease status post inferior myocardial infarction twice in 2004 and 2007. He was treated with thrombolytics in 2004 at [facility]. In 2007, he had a stent placement in the right coronary artery at [facility]. There was no other obstructive disease. Ejection fraction was normal. He has other chronic medical conditions that include morbid obesity, hypertension and hyperlipidemia. He was also recently diagnosed with type 2 diabetes. He has been trying to lose more weight. He denies any chest pain at this time. He has chronic exertional dyspnea. No orthopnea or PND. He is suffering from erectile dysfunction.   No Known Allergies   Current Outpatient Prescriptions on File Prior to Visit  Medication Sig Dispense Refill  . atorvastatin (LIPITOR) 40 MG tablet Take 40 mg by mouth daily.        . carvedilol (COREG) 25 MG tablet Take 25 mg by mouth 2 (two) times daily with a meal.        . clopidogrel (PLAVIX) 75 MG tablet Take 75 mg by mouth daily.        Marland Kitchen losartan-hydrochlorothiazide (HYZAAR) 100-25 MG per tablet Take 1 tablet by mouth daily.        . varenicline (CHANTIX PAK) 0.5 MG X 11 & 1 MG X 42 tablet Take by mouth 2 (two) times daily. Take one 0.5mg  tablet by mouth once daily for 3 days, then increase to one 0.5mg  tablet twice daily for 3 days, then increase to one 1mg  tablet twice daily.       Marland Kitchen aspirin 81 MG EC tablet Take 81 mg by mouth daily.           Past Medical History  Diagnosis Date  . MI (myocardial infarction) 1478,2956  . Hypertension   . Hyperlipidemia   . Sleep apnea   . Erectile dysfunction   . Diabetes mellitus     TYPE II  . Coronary artery disease     Inferior myocardial infarction in 2004.  He was treated with thrombolytics at [facility].  He then had an inferior MI in March  2007.  He received a Horizon study stent 3.5 x 20 mm in the distal RCA at [facility].  No other obstructive disease.  . Morbid obesity      Past Surgical History  Procedure Date  . Septoplasty   . Cardiac catheterization     @ [facility]     Family History  Problem Relation Age of Onset  . Adopted: Yes     History   Social History  . Marital Status: Single    Spouse Name: N/A    Number of Children: N/A  . Years of Education: N/A   Occupational History  . Not on file.   Social History Main Topics  . Smoking status: Current Everyday Smoker -- 2.0 packs/day for 15 years    Types: Cigarettes  . Smokeless tobacco: Never Used  . Alcohol Use: No  . Drug Use: No     prior hx of cocaine and marijuana  . Sexually Active: Not on file   Other Topics Concern  . Not on file   Social History Narrative  . No narrative on file     ROS Constitutional: Negative for fever, chills, diaphoresis, activity change, appetite change and fatigue.  HENT: Negative  for hearing loss, nosebleeds, congestion, sore throat, facial swelling, drooling, trouble swallowing, neck pain, voice change, sinus pressure and tinnitus.  Eyes: Negative for photophobia, pain, discharge and visual disturbance.  Respiratory: Negative for apnea, cough ,shortness of breath and wheezing.  Cardiovascular: Negative for chest pain, palpitations and leg swelling.  Gastrointestinal: Negative for nausea, vomiting, abdominal pain, diarrhea, constipation, blood in stool and abdominal distention.  Genitourinary: Negative for dysuria, urgency, frequency, hematuria and decreased urine volume.  Musculoskeletal: Negative for myalgias, back pain, joint swelling, arthralgias and gait problem.  Skin: Negative for color change, pallor, rash and wound.  Neurological: Negative for dizziness, tremors, seizures, syncope, speech difficulty, weakness, light-headedness, numbness and headaches.  Psychiatric/Behavioral: Negative for suicidal  ideas, hallucinations, behavioral problems and agitation. The patient is not nervous/anxious.     PHYSICAL EXAM   BP 150/92  Pulse 70  Ht 5\' 11"  (1.803 m)  Wt 316 lb 8 oz (143.563 kg)  BMI 44.14 kg/m2 Constitutional: He is oriented to person, place, and time. He appears well-developed and well-nourished. No distress.  HENT: No nasal discharge.  Head: Normocephalic and atraumatic.  Eyes: Pupils are equal and round. Right eye exhibits no discharge. Left eye exhibits no discharge.  Neck: Normal range of motion. Neck supple. No JVD present. No thyromegaly present.  Cardiovascular: Normal rate, regular rhythm, normal heart sounds and. Exam reveals no gallop and no friction rub. No murmur heard.  Pulmonary/Chest: Effort normal and breath sounds normal. No stridor. No respiratory distress. He has no wheezes. He has no rales. He exhibits no tenderness.  Abdominal: Soft. Bowel sounds are normal. He exhibits no distension. There is no tenderness. There is no rebound and no guarding.  Musculoskeletal: Normal range of motion. He exhibits no edema and no tenderness.  Neurological: He is alert and oriented to person, place, and time. Coordination normal.  Skin: Skin is warm and dry. No rash noted. He is not diaphoretic. No erythema. No pallor.  Psychiatric: He has a normal mood and affect. His behavior is normal. Judgment and thought content normal.       EKG: Sinus  Rhythm  Possible old inferior infarct.   ASSESSMENT AND PLAN

## 2011-08-13 NOTE — Patient Instructions (Addendum)
Your physician has requested that you have an exercise tolerance test. For further information please visit [email or web address] Please also follow instruction sheet, as given.  Your physician has requested that you have an echocardiogram. Echocardiography is a painless test that uses sound waves to create images of your heart. It provides your doctor with information about the size and shape of your heart and how well your heart's chambers and valves are working. This procedure takes approximately one hour. There are no restrictions for this procedure.  Follow up in 6 months.

## 2011-08-13 NOTE — Patient Instructions (Addendum)
Your stress test was OK.

## 2011-08-13 NOTE — Assessment & Plan Note (Signed)
His blood pressure is mildly elevated today and should be monitored to see if it's persistent. Amlodipine can be considered if needed.

## 2011-08-21 ENCOUNTER — Other Ambulatory Visit: Payer: Medicare Other

## 2011-08-28 ENCOUNTER — Encounter: Payer: Self-pay | Admitting: Cardiovascular Disease

## 2013-02-10 DIAGNOSIS — Q661 Congenital talipes calcaneovarus, unspecified foot: Secondary | ICD-10-CM | POA: Insufficient documentation

## 2013-02-10 DIAGNOSIS — E119 Type 2 diabetes mellitus without complications: Secondary | ICD-10-CM | POA: Insufficient documentation

## 2014-04-08 ENCOUNTER — Ambulatory Visit: Payer: Medicare Other | Admitting: Cardiovascular Disease

## 2014-04-08 ENCOUNTER — Encounter: Payer: Self-pay | Admitting: *Deleted

## 2014-09-16 DIAGNOSIS — M75121 Complete rotator cuff tear or rupture of right shoulder, not specified as traumatic: Secondary | ICD-10-CM | POA: Insufficient documentation

## 2014-09-29 ENCOUNTER — Inpatient Hospital Stay: Admission: RE | Admit: 2014-09-29 | Payer: Medicare Other | Source: Ambulatory Visit

## 2014-10-03 ENCOUNTER — Other Ambulatory Visit: Payer: Medicare Other

## 2014-10-06 ENCOUNTER — Ambulatory Visit: Admission: RE | Admit: 2014-10-06 | Payer: Medicare Other | Source: Ambulatory Visit | Admitting: Surgery

## 2014-10-06 ENCOUNTER — Encounter: Admission: RE | Payer: Self-pay | Source: Ambulatory Visit

## 2014-10-06 SURGERY — ARTHROSCOPY, SHOULDER
Anesthesia: Choice | Laterality: Right

## 2014-10-11 ENCOUNTER — Encounter: Payer: Self-pay | Admitting: Emergency Medicine

## 2014-10-11 ENCOUNTER — Emergency Department: Payer: Medicare Other

## 2014-10-11 ENCOUNTER — Emergency Department
Admission: EM | Admit: 2014-10-11 | Discharge: 2014-10-11 | Disposition: A | Discharge status: Home / Self Care | Payer: Medicare Other | Attending: Emergency Medicine | Admitting: Emergency Medicine

## 2014-10-11 DIAGNOSIS — Z79899 Other long term (current) drug therapy: Secondary | ICD-10-CM | POA: Diagnosis not present

## 2014-10-11 DIAGNOSIS — Z7902 Long term (current) use of antithrombotics/antiplatelets: Secondary | ICD-10-CM | POA: Insufficient documentation

## 2014-10-11 DIAGNOSIS — R110 Nausea: Secondary | ICD-10-CM | POA: Diagnosis not present

## 2014-10-11 DIAGNOSIS — I10 Essential (primary) hypertension: Secondary | ICD-10-CM | POA: Insufficient documentation

## 2014-10-11 DIAGNOSIS — E119 Type 2 diabetes mellitus without complications: Secondary | ICD-10-CM | POA: Insufficient documentation

## 2014-10-11 DIAGNOSIS — R42 Dizziness and giddiness: Secondary | ICD-10-CM | POA: Diagnosis not present

## 2014-10-11 DIAGNOSIS — Z7982 Long term (current) use of aspirin: Secondary | ICD-10-CM | POA: Insufficient documentation

## 2014-10-11 LAB — BASIC METABOLIC PANEL
Anion gap: 7 (ref 5–15)
BUN: 6 mg/dL (ref 6–20)
CALCIUM: 8.7 mg/dL — AB (ref 8.9–10.3)
CO2: 27 mmol/L (ref 22–32)
Chloride: 100 mmol/L — ABNORMAL LOW (ref 101–111)
Creatinine, Ser: 0.5 mg/dL — ABNORMAL LOW (ref 0.61–1.24)
GFR calc Af Amer: >60 mL/min (ref >60)
Glucose, Bld: 292 mg/dL — ABNORMAL HIGH (ref 65–99)
POTASSIUM: 4.4 mmol/L (ref 3.5–5.1)
SODIUM: 134 mmol/L — AB (ref 135–145)

## 2014-10-11 LAB — CBC
HEMATOCRIT: 44.4 % (ref 40.0–52.0)
Hemoglobin: 14.8 g/dL (ref 13.0–18.0)
MCH: 27 pg (ref 26.0–34.0)
MCHC: 33.3 g/dL (ref 32.0–36.0)
MCV: 81.2 fL (ref 80.0–100.0)
PLATELETS: 280 10*3/uL (ref 150–440)
RBC: 5.47 MIL/uL (ref 4.40–5.90)
RDW: 13.3 % (ref 11.5–14.5)
WBC: 6.5 10*3/uL (ref 3.8–10.6)

## 2014-10-11 LAB — URINALYSIS COMPLETE WITH MICROSCOPIC (ARMC ONLY)
BACTERIA UA: NONE SEEN
BILIRUBIN URINE: NEGATIVE
HGB URINE DIPSTICK: NEGATIVE
KETONES UR: NEGATIVE mg/dL
LEUKOCYTES UA: NEGATIVE
NITRITE: NEGATIVE
PH: 7 (ref 5.0–8.0)
Protein, ur: NEGATIVE mg/dL
RBC / HPF: NONE SEEN RBC/hpf (ref 0–5)
SPECIFIC GRAVITY, URINE: 1.029 (ref 1.005–1.030)
WBC, UA: NONE SEEN WBC/hpf (ref 0–5)

## 2014-10-11 LAB — GLUCOSE, CAPILLARY: Glucose-Capillary: 253 mg/dL — ABNORMAL HIGH (ref 65–99)

## 2014-10-11 MED ORDER — MECLIZINE HCL 32 MG PO TABS: 32.0000 mg | ORAL_TABLET | Freq: Three times a day (TID) | ORAL | Status: DC | PRN

## 2014-10-11 NOTE — ED Notes (Signed)
States he became dizzy while sitting in car this am. Then had some nausea. States this dizziness last for about 2 hours .symptoms's have eased off at present

## 2014-10-11 NOTE — Discharge Instructions (Signed)
Benign Positional Vertigo Vertigo means you feel like you or your surroundings are moving when they are not. Benign positional vertigo is the most common form of vertigo. Benign means that the cause of your condition is not serious. Benign positional vertigo is more common in older adults. CAUSES  Benign positional vertigo is the result of an upset in the labyrinth system. This is an area in the middle ear that helps control your balance. This may be caused by a viral infection, head injury, or repetitive motion. However, often no specific cause is found. SYMPTOMS  Symptoms of benign positional vertigo occur when you move your head or eyes in different directions. Some of the symptoms may include:  Loss of balance and falls.  Vomiting.  Blurred vision.  Dizziness.  Nausea.  Involuntary eye movements (nystagmus). DIAGNOSIS  Benign positional vertigo is usually diagnosed by physical exam. If the specific cause of your benign positional vertigo is unknown, your caregiver may perform imaging tests, such as magnetic resonance imaging (MRI) or computed tomography (CT). TREATMENT  Your caregiver may recommend movements or procedures to correct the benign positional vertigo. Medicines such as meclizine, benzodiazepines, and medicines for nausea may be used to treat your symptoms. In rare cases, if your symptoms are caused by certain conditions that affect the inner ear, you may need surgery. HOME CARE INSTRUCTIONS   Follow your caregiver's instructions.  Move slowly. Do not make sudden body or head movements.  Avoid driving.  Avoid operating heavy machinery.  Avoid performing any tasks that would be dangerous to you or others during a vertigo episode.  Drink enough fluids to keep your urine clear or pale yellow. SEEK IMMEDIATE MEDICAL CARE IF:   You develop problems with walking, weakness, numbness, or using your arms, hands, or legs.  You have difficulty speaking.  You develop  severe headaches.  Your nausea or vomiting continues or gets worse.  You develop visual changes.  Your family or friends notice any behavioral changes.  Your condition gets worse.  You have a fever.  You develop a stiff neck or sensitivity to light. MAKE SURE YOU:   Understand these instructions.  Will watch your condition.  Will get help right away if you are not doing well or get worse. Document Released: 10/15/2005 Document Revised: 04/01/2011 Document Reviewed: 09/27/2010 Exit[employer] Patient Information 2015 [employer]. This information is not intended to replace advice given to you by your health care provider. Make sure you discuss any questions you have with your health care provider.    

## 2014-10-11 NOTE — ED Provider Notes (Signed)
[facility] Emergency Department Provider Note  ____________________________________________  Time seen: 11:45 AM  I have reviewed the triage vital signs and the nursing notes.   HISTORY  Chief Complaint Nausea and Dizziness    HPI Jesse [name] is a 44 y.o. male who presents to the emergency department after an episode of dizziness where he felt like the room was spinning. This occurred while he was sitting down in his truck. He denies chest pain he denies focal neuro deficits. He's never had this happen before. He has felt like there is no fluid in his ears in the morning for the past couple of days but denies hearing deficits or headache. He feels well now. Has no complaints.     Past Medical History  Diagnosis Date  . MI (myocardial infarction) [id number]  . Hypertension   . Hyperlipidemia   . Sleep apnea   . Erectile dysfunction   . Diabetes mellitus     TYPE II  . Coronary artery disease     Inferior myocardial infarction in 2004.  He was treated with thrombolytics at [facility].  He then had an inferior MI in March 2007.  He received a Horizon study stent 3.5 x 20 mm in the distal RCA at [facility].  No other obstructive disease.  . Morbid obesity     Patient Active Problem List   Diagnosis Date Noted  . DYSPNEA 09/28/2008  . HYPERLIPIDEMIA-MIXED 08/13/2008  . OBESITY-MORBID (>100') 08/13/2008  . HYPERTENSION, BENIGN 08/13/2008  . CAD, NATIVE VESSEL 08/13/2008    Past Surgical History  Procedure Laterality Date  . Septoplasty    . Cardiac catheterization      @ [facility]    Current Outpatient Rx  Name  Route  Sig  Dispense  Refill  . metFORMIN (GLUCOPHAGE) 1000 MG tablet   Oral   Take 1,000 mg by mouth 2 (two) times daily with a meal.         . aspirin 81 MG EC tablet   Oral   Take 81 mg by mouth daily.           Marland Kitchen atorvastatin (LIPITOR) 40 MG tablet   Oral   Take 40 mg by mouth daily.           . carvedilol (COREG) 25 MG  tablet   Oral   Take 25 mg by mouth 2 (two) times daily with a meal.           . clopidogrel (PLAVIX) 75 MG tablet   Oral   Take 75 mg by mouth daily.           Marland Kitchen ibuprofen (ADVIL,MOTRIN) 800 MG tablet   Oral   Take 800 mg by mouth every 8 (eight) hours as needed.         Marland Kitchen losartan-hydrochlorothiazide (HYZAAR) 100-25 MG per tablet   Oral   Take 1 tablet by mouth daily.           . meclizine (ANTIVERT) 32 MG tablet   Oral   Take 1 tablet (32 mg total) by mouth 3 (three) times daily as needed.   30 tablet   0   . varenicline (CHANTIX PAK) 0.5 MG X 11 & 1 MG X 42 tablet   Oral   Take by mouth 2 (two) times daily. Take one 0.5mg  tablet by mouth once daily for 3 days, then increase to one 0.5mg  tablet twice daily for 3 days, then increase to one  tablet  twice daily.            Allergies Review of patient's allergies indicates no known allergies.  Family History  Problem Relation Age of Onset  . Adopted: Yes    Social History Social History  Substance Use Topics  . Smoking status: Never Smoker   . Smokeless tobacco: Never Used  . Alcohol Use: No    Review of Systems  Constitutional: Negative for fever. Eyes: Negative for visual changes. ENT: Negative for sore throat Cardiovascular: Negative for chest pain. Respiratory: Negative for shortness of breath. Gastrointestinal: Negative for abdominal pain, vomiting and diarrhea. Genitourinary: Negative for dysuria. Musculoskeletal: Negative for back pain. Skin: Negative for rash. Neurological: Negative for headaches or focal weakness Psychiatric: No anxiety    ____________________________________________   PHYSICAL EXAM:  VITAL SIGNS: ED Triage Vitals  Enc Vitals Group     BP 10/11/14 1001 153/108 mmHg     Pulse Rate 10/11/14 1001 67     Resp 10/11/14 1001 20     Temp 10/11/14 1001 98.5 F (36.9 C)     Temp Source 10/11/14 1001 Oral     SpO2 10/11/14 1001 100 %     Weight 10/11/14 1001 298 lb  (135.172 kg)     Height 10/11/14 1001  (1.803 m)     Head Cir --      Peak Flow --      Pain Score 10/11/14 1002 0     Pain Loc --      Pain Edu? --      Excl. in GC? --      Constitutional: Alert and oriented. Well appearing and in no distress. Eyes: Conjunctivae are normal.  ENT   Head: Normocephalic and atraumatic.   Mouth/Throat: Mucous membranes are moist. Cardiovascular: Normal rate, regular rhythm. Normal and symmetric distal pulses are present in all extremities. No murmurs, rubs, or gallops. Respiratory: Normal respiratory effort without tachypnea nor retractions. Breath sounds are clear and equal bilaterally.  Gastrointestinal: Soft and non-tender in all quadrants. No distention. There is no CVA tenderness. Genitourinary: deferred Musculoskeletal: Nontender with normal range of motion in all extremities. No lower extremity tenderness nor edema. Neurologic:  Normal speech and language. No gross focal neurologic deficits are appreciated. Skin:  Skin is warm, dry and intact. No rash noted. Psychiatric: Mood and affect are normal. Patient exhibits appropriate insight and judgment.  ____________________________________________    LABS (pertinent positives/negatives)  Labs Reviewed  BASIC METABOLIC PANEL - Abnormal; Notable for the following:    Sodium 134 (*)    Chloride 100 (*)    Glucose, Bld 292 (*)    Creatinine, Ser 0.50 (*)    Calcium 8.7 (*)    All other components within normal limits  URINALYSIS COMPLETEWITH MICROSCOPIC ([facility] ONLY) - Abnormal; Notable for the following:    Color, Urine STRAW (*)    APPearance CLEAR (*)    Glucose, UA >500 (*)    Squamous Epithelial / LPF 0-5 (*)    All other components within normal limits  GLUCOSE, CAPILLARY - Abnormal; Notable for the following:    Glucose-Capillary 253 (*)    All other components within normal limits  CBC  CBG MONITORING, ED    ____________________________________________   EKG  ED  ECG REPORT I, Jene Every, the attending physician, personally viewed and interpreted this ECG.  Date: 10/11/2014 EKG Time: 10:14 AM Rate: 67 Rhythm: normal sinus rhythm QRS Axis: normal Intervals: normal ST/T Wave abnormalities: normal Conduction Disutrbances:  none Narrative Interpretation: unremarkable   ____________________________________________    RADIOLOGY I have personally reviewed any xrays that were ordered on this patient: CT head unremarkable  ____________________________________________   PROCEDURES  Procedure(s) performed: none  Critical Care performed: none  ____________________________________________   INITIAL IMPRESSION / ASSESSMENT AND PLAN / ED COURSE  Pertinent labs & imaging results that were available during my care of the patient were reviewed by me and considered in my medical decision making (see chart for details).  Patient with episode of vertigo today. Labs CT unremarkable. He feels well in the emergency department. He is neurologically intact. He denies headache. He denies chest pain. We will treat him with an Rx for meclizine when necessary. Recommend follow-up with PCP  ____________________________________________   FINAL CLINICAL IMPRESSION(S) / ED DIAGNOSES  Final diagnoses:  Vertigo     Jene Every, MD 10/11/14 1659

## 2014-10-11 NOTE — ED Notes (Signed)
Pt to ed with c/o nausea and dizziness acute onset about 20 min pta.  Pt states last few days has felt like there was fluid in his ears bilat and then today when he moves at all he feels like the room is spinning.  Pt alert and oriented at this time. Skin warm and dry.  Pt also reports headache intermittently x 3 days.

## 2015-08-23 ENCOUNTER — Emergency Department
Admission: EM | Admit: 2015-08-23 | Discharge: 2015-08-23 | Disposition: A | Discharge status: Home / Self Care | Payer: Medicare Other | Attending: Emergency Medicine | Admitting: Emergency Medicine

## 2015-08-23 ENCOUNTER — Encounter: Payer: Self-pay | Admitting: Medical Oncology

## 2015-08-23 DIAGNOSIS — I2510 Atherosclerotic heart disease of native coronary artery without angina pectoris: Secondary | ICD-10-CM | POA: Insufficient documentation

## 2015-08-23 DIAGNOSIS — S61213A Laceration without foreign body of left middle finger without damage to nail, initial encounter: Secondary | ICD-10-CM | POA: Diagnosis not present

## 2015-08-23 DIAGNOSIS — Z7984 Long term (current) use of oral hypoglycemic drugs: Secondary | ICD-10-CM | POA: Insufficient documentation

## 2015-08-23 DIAGNOSIS — Y990 Civilian activity done for income or pay: Secondary | ICD-10-CM | POA: Diagnosis not present

## 2015-08-23 DIAGNOSIS — W230XXA Caught, crushed, jammed, or pinched between moving objects, initial encounter: Secondary | ICD-10-CM | POA: Insufficient documentation

## 2015-08-23 DIAGNOSIS — S6992XA Unspecified injury of left wrist, hand and finger(s), initial encounter: Secondary | ICD-10-CM | POA: Diagnosis present

## 2015-08-23 DIAGNOSIS — Z23 Encounter for immunization: Secondary | ICD-10-CM | POA: Insufficient documentation

## 2015-08-23 DIAGNOSIS — S61219A Laceration without foreign body of unspecified finger without damage to nail, initial encounter: Secondary | ICD-10-CM

## 2015-08-23 DIAGNOSIS — Y929 Unspecified place or not applicable: Secondary | ICD-10-CM | POA: Diagnosis not present

## 2015-08-23 DIAGNOSIS — I10 Essential (primary) hypertension: Secondary | ICD-10-CM | POA: Diagnosis not present

## 2015-08-23 DIAGNOSIS — E119 Type 2 diabetes mellitus without complications: Secondary | ICD-10-CM | POA: Diagnosis not present

## 2015-08-23 DIAGNOSIS — Y9389 Activity, other specified: Secondary | ICD-10-CM | POA: Insufficient documentation

## 2015-08-23 MED ORDER — TETANUS-DIPHTH-ACELL PERTUSSIS 5-2.5-18.5 LF-MCG/0.5 IM SUSP
0.5000 mL | Freq: Once | INTRAMUSCULAR | Status: AC
  Administered 2015-08-23: 0.5 mL via INTRAMUSCULAR
  Filled 2015-08-23: qty 0.5

## 2015-08-23 MED ORDER — LIDOCAINE HCL (PF) 1 % IJ SOLN
10.0000 mL | Freq: Once | INTRAMUSCULAR | Status: AC
  Administered 2015-08-23: 10 mL
  Filled 2015-08-23: qty 10

## 2015-08-23 NOTE — ED Provider Notes (Signed)
[facility] Emergency Department Provider Note  ____________________________________________  Time seen: Approximately 7:55 PM  I have reviewed the triage vital signs and the nursing notes.   HISTORY  Chief Complaint Laceration    HPI Jesse [name] is a 45 y.o. male who presents emergency department complaining of a laceration to the third digit of the left hand. Patient states that he was trying to loosen a ratchet strap when it released forcefully the gear inside the ratchet strap caught his finger and lacerating it. Patient states that he was able control bleeding easily. Denies any numbness or tingling in his finger. He reports full range of motion to the finger. He has not tried any medications prior to arrival. No other complaints at this time.   Past Medical History:  Diagnosis Date  . Coronary artery disease    Inferior myocardial infarction in 2004.  He was treated with thrombolytics at [facility].  He then had an inferior MI in March 2007.  He received a Horizon study stent 3.5 x 20 mm in the distal RCA at [facility].  No other obstructive disease.  . Diabetes mellitus    TYPE II  . Erectile dysfunction   . Hyperlipidemia   . Hypertension   . MI (myocardial infarction) (HCC) 1610,9604  . Morbid obesity (HCC)   . Sleep apnea     Patient Active Problem List   Diagnosis Date Noted  . DYSPNEA 09/28/2008  . HYPERLIPIDEMIA-MIXED 08/13/2008  . OBESITY-MORBID (>100') 08/13/2008  . HYPERTENSION, BENIGN 08/13/2008  . CAD, NATIVE VESSEL 08/13/2008    Past Surgical History:  Procedure Laterality Date  . CARDIAC CATHETERIZATION     @ [facility]  . SEPTOPLASTY      Prior to Admission medications   Medication Sig Start Date End Date Taking? Authorizing Provider  aspirin 81 MG EC tablet Take 81 mg by mouth daily.      Historical Provider, MD  atorvastatin (LIPITOR) 40 MG tablet Take 40 mg by mouth daily.      Historical Provider, MD  carvedilol (COREG) 25  MG tablet Take 25 mg by mouth 2 (two) times daily with a meal.      Historical Provider, MD  clopidogrel (PLAVIX) 75 MG tablet Take 75 mg by mouth daily.      Historical Provider, MD  ibuprofen (ADVIL,MOTRIN) 800 MG tablet Take 800 mg by mouth every 8 (eight) hours as needed.    Historical Provider, MD  losartan-hydrochlorothiazide (HYZAAR) 100-25 MG per tablet Take 1 tablet by mouth daily.      Historical Provider, MD  meclizine (ANTIVERT) 32 MG tablet Take 1 tablet (32 mg total) by mouth 3 (three) times daily as needed. 10/11/14   Jene Every, MD  metFORMIN (GLUCOPHAGE) 1000 MG tablet Take 1,000 mg by mouth 2 (two) times daily with a meal.    Historical Provider, MD  varenicline (CHANTIX PAK) 0.5 MG X 11 & 1 MG X 42 tablet Take by mouth 2 (two) times daily. Take one 0.5mg  tablet by mouth once daily for 3 days, then increase to one 0.5mg  tablet twice daily for 3 days, then increase to one 1mg  tablet twice daily.     Historical Provider, MD    Allergies Review of patient's allergies indicates no known allergies.  Family History  Problem Relation Age of Onset  . Adopted: Yes    Social History Social History  Substance Use Topics  . Smoking status: Never Smoker  . Smokeless tobacco: Never Used  .  Alcohol use No     Review of Systems  Constitutional: No fever/chills Cardiovascular: no chest pain. Respiratory: no cough. No SOB. Musculoskeletal: Negative for musculoskeletal pain. Skin: Positive for laceration to the third digit left hand Neurological: Negative for headaches, focal weakness or numbness. 10-point ROS otherwise negative.  ____________________________________________   PHYSICAL EXAM:  VITAL SIGNS: ED Triage Vitals  Enc Vitals Group     BP 08/23/15 1848 (!) 155/96     Pulse Rate 08/23/15 1848 95     Resp 08/23/15 1848 16     Temp 08/23/15 1848 98 F (36.7 C)     Temp Source 08/23/15 1848 Oral     SpO2 08/23/15 1848 97 %     Weight 08/23/15 1845 295 lb (133.8  kg)     Height 08/23/15 1845 5\' 11"  (1.803 m)     Head Circumference --      Peak Flow --      Pain Score 08/23/15 1846 8     Pain Loc --      Pain Edu? --      Excl. in GC? --      Constitutional: Alert and oriented. Well appearing and in no acute distress. Eyes: Conjunctivae are normal. PERRL. EOMI. Head: Atraumatic. Cardiovascular: Normal rate, regular rhythm. Normal S1 and S2.  Good peripheral circulation. Respiratory: Normal respiratory effort without tachypnea or retractions. Lungs CTAB. Good air entry to the bases with no decreased or absent breath sounds. Musculoskeletal: Full range of motion to all extremities. No gross deformities appreciated. Neurologic:  Normal speech and language. No gross focal neurologic deficits are appreciated. No deformity to the digits of the left hand. Full range of motion 5 digits. Sensation and cap refill intact 5 digits. See below for skin note: Laceration third digit Skin:  Skin is warm, dry and intact. No rash noted. For similar laceration noted to the palmar aspect of the third digit left hand. Edges are smooth. No bleeding. No visible foreign body. Good range of motion to finger. Sensation and cap refill intact distally. Psychiatric: Mood and affect are normal. Speech and behavior are normal. Patient exhibits appropriate insight and judgement.   ____________________________________________   LABS (all labs ordered are listed, but only abnormal results are displayed)  Labs Reviewed - No data to display ____________________________________________  EKG   ____________________________________________  RADIOLOGY   No results found.  ____________________________________________    PROCEDURES  Procedure(s) performed:    Procedures  LACERATION REPAIR Performed by: Racheal Patches Authorized by: Racheal Patches Consent: Verbal consent obtained. Risks and benefits: risks, benefits and alternatives were  discussed Consent given by: patient Patient identity confirmed: provided demographic data Prepped and Draped in normal sterile fashion Wound explored  Laceration Location: Third digit left hand  Laceration Length: 4 cm  No Foreign Bodies seen or palpated  Anesthesia: Digital block   Local anesthetic: lidocaine 1 % without epinephrine  Anesthetic total: 5 ml  Irrigation method: syringe Amount of cleaning: standard  Skin closure: 4-0 Ethilon sutures   Number of sutures: 5  Technique: Simple interrupted   Patient tolerance: Patient tolerated the procedure well with no immediate complications.   Medications  lidocaine (PF) (XYLOCAINE) 1 % injection 10 mL (not administered)  Tdap (BOOSTRIX) injection 0.5 mL (0.5 mLs Intramuscular Given 08/23/15 2015)     ____________________________________________   INITIAL IMPRESSION / ASSESSMENT AND PLAN / ED COURSE  Pertinent labs & imaging results that were available during my care of the patient were  reviewed by me and considered in my medical decision making (see chart for details).  Clinical Course    Patient's diagnosis is consistent with Laceration of the third digit left hand. Laceration is sutured as described above. Patient tolerated well. Patient to take Tylenol and Motrin as needed for any pain. Patient will follow-up with primary care or urgent care in one week for suture removal..  Patient is given ED precautions to return to the ED for any worsening or new symptoms.     ____________________________________________  FINAL CLINICAL IMPRESSION(S) / ED DIAGNOSES  Final diagnoses:  Laceration of finger, initial encounter      NEW MEDICATIONS STARTED DURING THIS VISIT:  New Prescriptions   No medications on file        This chart was dictated using voice recognition software/Dragon. Despite best efforts to proofread, errors can occur which can change the meaning. Any change was purely unintentional.     Racheal Patches, PA-C 08/23/15 2113    Jennye Moccasin, MD 08/23/15 2117

## 2015-08-23 NOTE — ED Triage Notes (Signed)
Lac to left hand middle finger on a metal strap today. Tetanus NOT UTD.

## 2015-08-23 NOTE — ED Notes (Signed)
Pt in via triage w/ laceration to left middle finger, approximately 3 inches long.  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  Pt states, "I don't know if it went down to the bone, it throbs."  Bleeding controlled at this time.  No immediate distress noted.

## 2016-07-05 ENCOUNTER — Encounter
Admission: EM | Disposition: A | Discharge status: Home / Self Care | Payer: Self-pay | Source: Home / Self Care | Attending: Internal Medicine

## 2016-07-05 ENCOUNTER — Inpatient Hospital Stay (HOSPITAL_COMMUNITY)
Admit: 2016-07-05 | Discharge: 2016-07-05 | Disposition: A | Discharge status: Home / Self Care | Payer: Medicare Other | Attending: Physician Assistant | Admitting: Physician Assistant

## 2016-07-05 ENCOUNTER — Encounter: Payer: Self-pay | Admitting: Emergency Medicine

## 2016-07-05 ENCOUNTER — Inpatient Hospital Stay
Admission: EM | Admit: 2016-07-05 | Discharge: 2016-07-06 | DRG: 247 | Disposition: A | Discharge status: Home / Self Care | Payer: Medicare Other | Attending: Internal Medicine | Admitting: Internal Medicine

## 2016-07-05 ENCOUNTER — Inpatient Hospital Stay: Payer: Medicare Other

## 2016-07-05 DIAGNOSIS — I2111 ST elevation (STEMI) myocardial infarction involving right coronary artery: Secondary | ICD-10-CM

## 2016-07-05 DIAGNOSIS — E1165 Type 2 diabetes mellitus with hyperglycemia: Secondary | ICD-10-CM | POA: Diagnosis present

## 2016-07-05 DIAGNOSIS — Z794 Long term (current) use of insulin: Secondary | ICD-10-CM | POA: Diagnosis not present

## 2016-07-05 DIAGNOSIS — R0602 Shortness of breath: Secondary | ICD-10-CM

## 2016-07-05 DIAGNOSIS — E785 Hyperlipidemia, unspecified: Secondary | ICD-10-CM | POA: Diagnosis present

## 2016-07-05 DIAGNOSIS — E669 Obesity, unspecified: Secondary | ICD-10-CM | POA: Diagnosis present

## 2016-07-05 DIAGNOSIS — F1910 Other psychoactive substance abuse, uncomplicated: Secondary | ICD-10-CM

## 2016-07-05 DIAGNOSIS — I252 Old myocardial infarction: Secondary | ICD-10-CM | POA: Diagnosis not present

## 2016-07-05 DIAGNOSIS — R079 Chest pain, unspecified: Secondary | ICD-10-CM | POA: Diagnosis present

## 2016-07-05 DIAGNOSIS — I255 Ischemic cardiomyopathy: Secondary | ICD-10-CM | POA: Diagnosis not present

## 2016-07-05 DIAGNOSIS — Z7902 Long term (current) use of antithrombotics/antiplatelets: Secondary | ICD-10-CM | POA: Diagnosis not present

## 2016-07-05 DIAGNOSIS — I10 Essential (primary) hypertension: Secondary | ICD-10-CM | POA: Diagnosis not present

## 2016-07-05 DIAGNOSIS — Z9114 Patient's other noncompliance with medication regimen: Secondary | ICD-10-CM | POA: Diagnosis not present

## 2016-07-05 DIAGNOSIS — Z955 Presence of coronary angioplasty implant and graft: Secondary | ICD-10-CM | POA: Diagnosis not present

## 2016-07-05 DIAGNOSIS — Z7982 Long term (current) use of aspirin: Secondary | ICD-10-CM | POA: Diagnosis not present

## 2016-07-05 DIAGNOSIS — I2119 ST elevation (STEMI) myocardial infarction involving other coronary artery of inferior wall: Principal | ICD-10-CM | POA: Diagnosis present

## 2016-07-05 DIAGNOSIS — I2510 Atherosclerotic heart disease of native coronary artery without angina pectoris: Secondary | ICD-10-CM | POA: Diagnosis present

## 2016-07-05 DIAGNOSIS — G4733 Obstructive sleep apnea (adult) (pediatric): Secondary | ICD-10-CM | POA: Diagnosis present

## 2016-07-05 DIAGNOSIS — Z91199 Patient's noncompliance with other medical treatment and regimen due to unspecified reason: Secondary | ICD-10-CM

## 2016-07-05 DIAGNOSIS — Z9119 Patient's noncompliance with other medical treatment and regimen: Secondary | ICD-10-CM

## 2016-07-05 DIAGNOSIS — I25110 Atherosclerotic heart disease of native coronary artery with unstable angina pectoris: Secondary | ICD-10-CM

## 2016-07-05 DIAGNOSIS — Z79899 Other long term (current) drug therapy: Secondary | ICD-10-CM

## 2016-07-05 DIAGNOSIS — I5030 Unspecified diastolic (congestive) heart failure: Secondary | ICD-10-CM

## 2016-07-05 DIAGNOSIS — I213 ST elevation (STEMI) myocardial infarction of unspecified site: Secondary | ICD-10-CM

## 2016-07-05 HISTORY — PX: LEFT HEART CATH AND CORONARY ANGIOGRAPHY: CATH118249

## 2016-07-05 LAB — LIPID PANEL
CHOL/HDL RATIO: 8 ratio
CHOLESTEROL: 191 mg/dL (ref 0–200)
HDL: 24 mg/dL — AB (ref >40)
LDL CALC: UNDETERMINED mg/dL (ref 0–99)
TRIGLYCERIDES: 485 mg/dL — AB (ref <150)
VLDL: UNDETERMINED mg/dL (ref 0–40)

## 2016-07-05 LAB — TROPONIN I
Troponin I: <0.03 ng/mL (ref <0.03)
Troponin I: <0.03 ng/mL (ref <0.03)
Troponin I: 0.03 ng/mL (ref <0.03)

## 2016-07-05 LAB — CBC
HCT: 48.6 % (ref 40.0–52.0)
Hemoglobin: 16.1 g/dL (ref 13.0–18.0)
MCH: 27.5 pg (ref 26.0–34.0)
MCHC: 33.2 g/dL (ref 32.0–36.0)
MCV: 82.7 fL (ref 80.0–100.0)
Platelets: 345 10*3/uL (ref 150–440)
RBC: 5.87 MIL/uL (ref 4.40–5.90)
RDW: 13.8 % (ref 11.5–14.5)
WBC: 12.6 10*3/uL — ABNORMAL HIGH (ref 3.8–10.6)

## 2016-07-05 LAB — BASIC METABOLIC PANEL
Anion gap: 9 (ref 5–15)
BUN: 14 mg/dL (ref 6–20)
CO2: 26 mmol/L (ref 22–32)
CREATININE: 1.1 mg/dL (ref 0.61–1.24)
Calcium: 8.9 mg/dL (ref 8.9–10.3)
Chloride: 99 mmol/L — ABNORMAL LOW (ref 101–111)
Glucose, Bld: 291 mg/dL — ABNORMAL HIGH (ref 65–99)
Potassium: 3.9 mmol/L (ref 3.5–5.1)
SODIUM: 134 mmol/L — AB (ref 135–145)

## 2016-07-05 LAB — URINE DRUG SCREEN, QUALITATIVE (ARMC ONLY)
AMPHETAMINES, UR SCREEN: NOT DETECTED
BARBITURATES, UR SCREEN: NOT DETECTED
BENZODIAZEPINE, UR SCRN: NOT DETECTED
CANNABINOID 50 NG, UR ~~LOC~~: NOT DETECTED
Cocaine Metabolite,Ur ~~LOC~~: NOT DETECTED
MDMA (Ecstasy)Ur Screen: NOT DETECTED
Methadone Scn, Ur: NOT DETECTED
OPIATE, UR SCREEN: NOT DETECTED
PHENCYCLIDINE (PCP) UR S: NOT DETECTED
Tricyclic, Ur Screen: NOT DETECTED

## 2016-07-05 LAB — GLUCOSE, CAPILLARY
GLUCOSE-CAPILLARY: 258 mg/dL — AB (ref 65–99)
GLUCOSE-CAPILLARY: 249 mg/dL — AB (ref 65–99)
Glucose-Capillary: 318 mg/dL — ABNORMAL HIGH (ref 65–99)
Glucose-Capillary: 281 mg/dL — ABNORMAL HIGH (ref 65–99)
Glucose-Capillary: 241 mg/dL — ABNORMAL HIGH (ref 65–99)
Glucose-Capillary: 176 mg/dL — ABNORMAL HIGH (ref 65–99)

## 2016-07-05 LAB — APTT: aPTT: 124 seconds — ABNORMAL HIGH (ref 24–36)

## 2016-07-05 LAB — PROTIME-INR
INR: 1.1
PROTHROMBIN TIME: 14.2 s (ref 11.4–15.2)

## 2016-07-05 LAB — TSH: TSH: 1.828 u[IU]/mL (ref 0.350–4.500)

## 2016-07-05 LAB — MRSA PCR SCREENING: MRSA BY PCR: NEGATIVE

## 2016-07-05 LAB — CARDIAC CATHETERIZATION: Cath EF Quantitative: 50 %

## 2016-07-05 LAB — POCT ACTIVATED CLOTTING TIME: Activated Clotting Time: 356 seconds

## 2016-07-05 SURGERY — LEFT HEART CATH AND CORONARY ANGIOGRAPHY
Anesthesia: Moderate Sedation

## 2016-07-05 MED ORDER — ACETAMINOPHEN 325 MG PO TABS
650.0000 mg | ORAL_TABLET | Freq: Four times a day (QID) | ORAL | Status: DC | PRN
Start: 2016-07-05 — End: 2016-07-06

## 2016-07-05 MED ORDER — SODIUM CHLORIDE 0.9 % IV SOLN
Freq: Once | INTRAVENOUS | Status: AC
  Administered 2016-07-05: 03:00:00 via INTRAVENOUS

## 2016-07-05 MED ORDER — SODIUM CHLORIDE 0.9% FLUSH
3.0000 mL | Freq: Two times a day (BID) | INTRAVENOUS | Status: DC
  Administered 2016-07-05 – 2016-07-06 (×2): 3 mL via INTRAVENOUS

## 2016-07-05 MED ORDER — HEPARIN (PORCINE) IN NACL 2-0.9 UNIT/ML-% IJ SOLN
INTRAMUSCULAR | Status: DC | PRN
  Administered 2016-07-05: 04:00:00

## 2016-07-05 MED ORDER — MECLIZINE HCL 12.5 MG PO TABS
32.0000 mg | ORAL_TABLET | Freq: Three times a day (TID) | ORAL | Status: DC | PRN
  Filled 2016-07-05: qty 0.5

## 2016-07-05 MED ORDER — ENOXAPARIN SODIUM 40 MG/0.4ML ~~LOC~~ SOLN
40.0000 mg | SUBCUTANEOUS | Status: DC
  Filled 2016-07-05: qty 0.4

## 2016-07-05 MED ORDER — ACETAMINOPHEN 650 MG RE SUPP: 650.0000 mg | Freq: Four times a day (QID) | RECTAL | Status: DC | PRN

## 2016-07-05 MED ORDER — NITROGLYCERIN 5 MG/ML IV SOLN
INTRAVENOUS | Status: AC
  Filled 2016-07-05: qty 10

## 2016-07-05 MED ORDER — ONDANSETRON HCL 4 MG PO TABS: 4.0000 mg | ORAL_TABLET | Freq: Four times a day (QID) | ORAL | Status: DC | PRN

## 2016-07-05 MED ORDER — SODIUM CHLORIDE 0.9 % WEIGHT BASED INFUSION
1.0000 mL/kg/h | INTRAVENOUS | Status: AC
  Administered 2016-07-05: 1 mL/kg/h via INTRAVENOUS

## 2016-07-05 MED ORDER — HEPARIN (PORCINE) IN NACL 2-0.9 UNIT/ML-% IJ SOLN
INTRAMUSCULAR | Status: AC
  Filled 2016-07-05: qty 500

## 2016-07-05 MED ORDER — ASPIRIN 81 MG PO CHEW
81.0000 mg | CHEWABLE_TABLET | Freq: Every day | ORAL | Status: DC
  Administered 2016-07-05 – 2016-07-06 (×2): 81 mg via ORAL
  Filled 2016-07-05 (×2): qty 1

## 2016-07-05 MED ORDER — SODIUM CHLORIDE 0.9 % IV SOLN
INTRAVENOUS | Status: AC | PRN
  Administered 2016-07-05: 1.75 mg/kg/h via INTRAVENOUS
  Administered 2016-07-05: 0.25 mg/kg/h via INTRAVENOUS

## 2016-07-05 MED ORDER — CLOPIDOGREL BISULFATE 75 MG PO TABS
75.0000 mg | ORAL_TABLET | Freq: Every day | ORAL | Status: DC
  Administered 2016-07-05 – 2016-07-06 (×2): 75 mg via ORAL
  Filled 2016-07-05 (×2): qty 1

## 2016-07-05 MED ORDER — BIVALIRUDIN BOLUS VIA INFUSION - CUPID
INTRAVENOUS | Status: DC | PRN
  Administered 2016-07-05: 88.425 mg via INTRAVENOUS

## 2016-07-05 MED ORDER — HYDRALAZINE HCL 20 MG/ML IJ SOLN: 5.0000 mg | INTRAMUSCULAR | Status: AC | PRN

## 2016-07-05 MED ORDER — METOPROLOL TARTRATE 25 MG PO TABS
25.0000 mg | ORAL_TABLET | Freq: Two times a day (BID) | ORAL | Status: DC
  Administered 2016-07-05 – 2016-07-06 (×3): 25 mg via ORAL
  Filled 2016-07-05 (×3): qty 1

## 2016-07-05 MED ORDER — DOCUSATE SODIUM 100 MG PO CAPS
100.0000 mg | ORAL_CAPSULE | Freq: Two times a day (BID) | ORAL | Status: DC
  Administered 2016-07-05 – 2016-07-06 (×2): 100 mg via ORAL
  Filled 2016-07-05 (×3): qty 1

## 2016-07-05 MED ORDER — INSULIN ASPART 100 UNIT/ML ~~LOC~~ SOLN
0.0000 [IU] | Freq: Three times a day (TID) | SUBCUTANEOUS | Status: DC
  Administered 2016-07-05: 5 [IU] via SUBCUTANEOUS
  Administered 2016-07-05: 3 [IU] via SUBCUTANEOUS
  Administered 2016-07-06: 5 [IU] via SUBCUTANEOUS
  Filled 2016-07-05 (×3): qty 1

## 2016-07-05 MED ORDER — INSULIN ASPART 100 UNIT/ML ~~LOC~~ SOLN
0.0000 [IU] | Freq: Three times a day (TID) | SUBCUTANEOUS | Status: DC
  Administered 2016-07-05: 5 [IU] via SUBCUTANEOUS
  Filled 2016-07-05: qty 1

## 2016-07-05 MED ORDER — HEPARIN (PORCINE) IN NACL 100-0.45 UNIT/ML-% IJ SOLN: 1400.0000 [IU]/h | INTRAMUSCULAR | Status: DC

## 2016-07-05 MED ORDER — BIVALIRUDIN TRIFLUOROACETATE 250 MG IV SOLR
INTRAVENOUS | Status: AC
  Filled 2016-07-05: qty 250

## 2016-07-05 MED ORDER — INSULIN ASPART 100 UNIT/ML ~~LOC~~ SOLN
0.0000 [IU] | Freq: Every day | SUBCUTANEOUS | Status: DC
  Administered 2016-07-05: 2 [IU] via SUBCUTANEOUS
  Filled 2016-07-05: qty 1

## 2016-07-05 MED ORDER — IOPAMIDOL (ISOVUE-300) INJECTION 61%
INTRAVENOUS | Status: DC | PRN
  Administered 2016-07-05: 270 mL

## 2016-07-05 MED ORDER — SODIUM CHLORIDE 0.9 % IV SOLN: INTRAVENOUS | Status: DC

## 2016-07-05 MED ORDER — ATORVASTATIN CALCIUM 20 MG PO TABS
80.0000 mg | ORAL_TABLET | Freq: Every day | ORAL | Status: DC
  Administered 2016-07-05: 80 mg via ORAL
  Filled 2016-07-05: qty 4

## 2016-07-05 MED ORDER — HEPARIN SODIUM (PORCINE) 5000 UNIT/ML IJ SOLN
4000.0000 [IU] | Freq: Once | INTRAMUSCULAR | Status: AC
  Administered 2016-07-05: 4000 [IU] via INTRAVENOUS

## 2016-07-05 MED ORDER — ONDANSETRON HCL 4 MG/2ML IJ SOLN: 4.0000 mg | Freq: Four times a day (QID) | INTRAMUSCULAR | Status: DC | PRN

## 2016-07-05 MED ORDER — SODIUM CHLORIDE 0.9 % IV SOLN: 0.2500 mg/kg/h | INTRAVENOUS | Status: AC

## 2016-07-05 MED ORDER — NITROGLYCERIN 1 MG/10 ML FOR IR/CATH LAB
INTRA_ARTERIAL | Status: DC | PRN
Start: 2016-07-05 — End: 2016-07-05
  Administered 2016-07-05: 200 ug via INTRACORONARY

## 2016-07-05 MED ORDER — ACETAMINOPHEN 325 MG PO TABS: 650.0000 mg | ORAL_TABLET | ORAL | Status: DC | PRN

## 2016-07-05 MED ORDER — INSULIN GLARGINE 100 UNIT/ML ~~LOC~~ SOLN
16.0000 [IU] | Freq: Every day | SUBCUTANEOUS | Status: DC
  Administered 2016-07-05 – 2016-07-06 (×2): 16 [IU] via SUBCUTANEOUS
  Filled 2016-07-05 (×2): qty 0.16

## 2016-07-05 MED ORDER — INSULIN ASPART 100 UNIT/ML ~~LOC~~ SOLN: 0.0000 [IU] | Freq: Every day | SUBCUTANEOUS | Status: DC

## 2016-07-05 MED ORDER — LABETALOL HCL 5 MG/ML IV SOLN: 10.0000 mg | INTRAVENOUS | Status: AC | PRN

## 2016-07-05 MED ORDER — SODIUM CHLORIDE 0.9 % IV SOLN: 250.0000 mL | INTRAVENOUS | Status: DC | PRN

## 2016-07-05 MED ORDER — LISINOPRIL 5 MG PO TABS
5.0000 mg | ORAL_TABLET | Freq: Every day | ORAL | Status: DC
  Administered 2016-07-05 – 2016-07-06 (×2): 5 mg via ORAL
  Filled 2016-07-05 (×2): qty 1

## 2016-07-05 MED ORDER — SODIUM CHLORIDE 0.9% FLUSH: 3.0000 mL | INTRAVENOUS | Status: DC | PRN

## 2016-07-05 MED ORDER — CLOPIDOGREL BISULFATE 75 MG PO TABS
300.0000 mg | ORAL_TABLET | Freq: Once | ORAL | Status: AC
  Administered 2016-07-05: 300 mg via ORAL

## 2016-07-05 MED ORDER — INSULIN ASPART 100 UNIT/ML ~~LOC~~ SOLN
4.0000 [IU] | Freq: Three times a day (TID) | SUBCUTANEOUS | Status: DC
  Administered 2016-07-05 – 2016-07-06 (×3): 4 [IU] via SUBCUTANEOUS
  Filled 2016-07-05 (×3): qty 1

## 2016-07-05 SURGICAL SUPPLY — 16 items
BALLN TREK RX 2.5X15 (BALLOONS) ×2
BALLOON TREK RX 2.5X15 (BALLOONS) IMPLANT
CATH INFINITI 5FR ANG PIGTAIL (CATHETERS) ×1 IMPLANT
CATH INFINITI 5FR JL4 (CATHETERS) ×1 IMPLANT
CATH LAUNCHER 6FR JR4 (CATHETERS) ×1 IMPLANT
DEVICE CLOSURE MYNXGRIP 6/7F (Vascular Products) ×1 IMPLANT
DEVICE INFLAT 30 PLUS (MISCELLANEOUS) ×1 IMPLANT
KIT MANI 3VAL PERCEP (MISCELLANEOUS) ×2 IMPLANT
NDL PERC 18GX7CM (NEEDLE) IMPLANT
NEEDLE PERC 18GX7CM (NEEDLE) ×2 IMPLANT
PACK CARDIAC CATH (CUSTOM PROCEDURE TRAY) ×2 IMPLANT
SHEATH AVANTI 6FR X 11CM (SHEATH) ×1 IMPLANT
STENT XIENCE ALPINE RX 2.75X18 (Permanent Stent) ×1 IMPLANT
STENT XIENCE ALPINE RX 3.0X23 (Permanent Stent) ×1 IMPLANT
WIRE EMERALD 3MM-J .035X150CM (WIRE) ×1 IMPLANT
WIRE G HI TQ BMW 190 (WIRE) ×1 IMPLANT

## 2016-07-05 NOTE — ED Notes (Addendum)
Pt states stopped taking daily meds 6-8 months ago and states began to retake meds 2 weeks ago when chest pain started.

## 2016-07-05 NOTE — Progress Notes (Addendum)
Name: Jesse [name] MRN: [id number] DOB: 28-Nov-1970    ADMISSION DATE:  07/05/2016  CHIEF COMPLAINT:  Chest pain  BRIEF PATIENT DESCRIPTION: 46 year old male with inferior wall MI s/p PCI to Mid RCA  SIGNIFICANT EVENTS  6/15>> Patient had inferior wall MI, S/P PCI to mid RCA  STUDIES:  07/05/16 Left heart cath and Coronary angiography>>STEMI inferior wall Successful PCI stent to mid RCA deployment of 2 overlapping DES stents reducing lesion from 90% down to 0 Diagnostic cardiac catheter of the left system which showed mild to moderate diffuse disease Left ventricular function appears to be mildly reduced Between 45-50% with mild inferior hypo  Referring physician: Dr. Elisabeth Pigeon for MI.  HISTORY OF PRESENT ILLNESS:  Jesse [name] is a 46 year old male with known history of CAD,DM, Erectile dysfunction, Hypertension, MI(2004,2007) and sleep apnea. Patient presented to ED with some chest pain  Which has been going on for couple of weeks. Patient had some EKG changes which was concerning for inferior Wall MI.  Code STEMI was activated .  Patient had successful PCI  And stent placement to Mid RCA.  Post procedure patient was sent to the ICU for observation.  PAST MEDICAL HISTORY :   has a past medical history of Coronary artery disease; Diabetes mellitus; Erectile dysfunction; Hyperlipidemia; Hypertension; MI (myocardial infarction) (HCC) (1191,4782); Morbid obesity (HCC); and Sleep apnea.  has a past surgical history that includes Septoplasty; Cardiac catheterization; and Left Heart Cath and Coronary Angiography (N/A, 07/05/2016). Prior to Admission medications   Medication Sig Start Date End Date Taking? Authorizing Provider  aspirin 81 MG EC tablet Take 81 mg by mouth daily.     Yes [provider]  atorvastatin (LIPITOR) 40 MG tablet Take 40 mg by mouth daily.     Yes [provider]  benazepril (LOTENSIN) 10 MG tablet Take 10 mg by mouth daily.   Yes [provider]  carvedilol (COREG) 25 MG tablet Take 25 mg by mouth 2 (two) times daily with a meal.     Yes [provider]  clopidogrel (PLAVIX) 75 MG tablet Take 75 mg by mouth daily.     Yes [provider]  ibuprofen (ADVIL,MOTRIN) 800 MG tablet Take 800 mg by mouth every 8 (eight) hours as needed.   Yes [provider]  insulin aspart (NOVOLOG) 100 UNIT/ML injection Inject into the skin 3 (three) times daily before meals.   Yes [provider]  insulin detemir (LEVEMIR) 100 UNIT/ML injection Inject into the skin daily.   Yes [provider]  metFORMIN (GLUCOPHAGE) 1000 MG tablet Take 1,000 mg by mouth 2 (two) times daily with a meal.   Yes [provider]  meclizine (ANTIVERT) 32 MG tablet Take 1 tablet (32 mg total) by mouth 3 (three) times daily as needed. 10/11/14   Jene Every, MD   No Known Allergies  FAMILY HISTORY:  family history is not on file. He was adopted. SOCIAL HISTORY:  reports that he has quit smoking. His smoking use included Cigarettes. He has a 30.00 pack-year smoking history. He has never used smokeless tobacco. He reports that he does not drink alcohol or use drugs.  REVIEW OF SYSTEMS:   Constitutional: Negative for fever, chills, weight loss, malaise/fatigue and diaphoresis.  HENT: Negative for hearing loss, ear pain, nosebleeds, congestion, sore throat, neck pain, tinnitus and ear discharge.   Eyes: Negative for blurred vision, double vision, photophobia, pain, discharge and redness.  Respiratory: Negative for  cough, hemoptysis, sputum production, shortness of breath, wheezing and stridor.   Cardiovascular: Negative for chest pain, palpitations, orthopnea, claudication, leg swelling and PND.  Gastrointestinal: Negative for heartburn, nausea, vomiting, abdominal pain, diarrhea, constipation, blood in stool and melena.  Genitourinary: Negative for dysuria, urgency, frequency, hematuria and flank pain.    Musculoskeletal: Negative for myalgias, back pain, joint pain and falls.  Skin: Negative for itching and rash.  Neurological: Negative for dizziness, tingling, tremors, sensory change, speech change, focal weakness, seizures, loss of consciousness, weakness and headaches.  Endo/Heme/Allergies: Negative for environmental allergies and polydipsia. Does not bruise/bleed easily.  SUBJECTIVE: Patient states "that he is feeling better"  VITAL SIGNS: Temp:  [97.9 F (36.6 C)-98.3 F (36.8 C)] 97.9 F (36.6 C) (06/15 0800) Pulse Rate:  [70-90] 70 (06/15 1100) Resp:  [7-24] 7 (06/15 1100) BP: (126-168)/(81-111) 146/92 (06/15 1100) SpO2:  [94 %-100 %] 97 % (06/15 1100) Weight:  [117.9 kg (259 lb 14.8 oz)-117.9 kg (260 lb)] 117.9 kg (259 lb 14.8 oz) (06/15 0435)  PHYSICAL EXAMINATION: General:  Middle aged male, found on bed, in no acute distress Neuro:  Awake,alert,oriented HEENT:  AT,[place], No JVD,PERRLA Cardiovascular:  S1 S2,Regular, no m/r/g noted Lungs:  Clear bilaterally, no wheezes,crackles,rhonchi Abdomen: soft,nontender Musculoskeletal:  No edema,cyanosis noted Skin: warm ,dry and intact   Recent Labs Lab 07/05/16 0209  NA 134*  K 3.9  CL 99*  CO2 26  BUN 14  CREATININE 1.10  GLUCOSE 291*    Recent Labs Lab 07/05/16 0209  HGB 16.1  HCT 48.6  WBC 12.6*  PLT 345   Dg Chest 1 View  Result Date: 07/05/2016 CLINICAL DATA:  Heart failure EXAM: CHEST 1 VIEW COMPARISON:  04/29/2006 FINDINGS: Cardiac shadow it is stable at the upper limits of normal. The lungs are well aerated bilaterally. No focal infiltrate or sizable effusion is seen. No bony abnormality is noted. IMPRESSION: No acute abnormality seen. Electronically Signed   By: Alcide Clever M.D.   On: 07/05/2016 07:15    ASSESSMENT / PLAN:  STEMI Inferior Wall MI S/P PCI stent to mid RCA Hx of Hypertension/hyperlipidemia Hx of DM  Plan Continue Bival per cards recommendation Continue aspirin/plavix Continue  Lipitor Blood glucose checks with SSI coverage Continue metoprolol/lisinopril    Bincy Varughese,AG-ACNP Pulmonary and Critical Care Medicine [facility]  STAFF NOTE: I. Dr. Nicholos Johns, have personally reviewed the patient's available data including medical history , events of notes, physican examination and test results as part of my evaluation. I have discussed with the  Care with the NP and other care providers including  pharmacist, ICU RN, RRT, dietary.  The patient is an 46 year old male, who underwent an emergent PCI with overlapping drug-eluting stents 2 in the RCA. He has a history of ongoing nicotine abuse, diabetes mellitus, hypertension. He is apparently noncompliant with medications.  He is currently feeling significantly better. Will continue anticoagulation as recommended by cardiology, continue blood sugar control, counseled on the importance of nicotine cessation.   Physical Exam  Constitutional: He is oriented to person, place, and time and well-developed, well-nourished, and in no distress.  HENT:  Head: Normocephalic.  Eyes: Pupils are equal, round, and reactive to light.  Cardiovascular: Normal rate and normal heart sounds.   Pulmonary/Chest: Effort normal and breath sounds normal.  Abdominal: Soft. Bowel sounds are normal.  Neurological: He is alert and oriented to person, place, and time.  Skin: Skin is warm and dry. He is not diaphoretic.    Deep Nicholos Johns,  MD.   Board Certified in Internal Medicine, Pulmonary Medicine, Critical Care Medicine, and Sleep Medicine.  [facility] Office Number: [phone] Pager: [phone]  Santiago Gladavid Kasa, M.D.  Billy Fischeravid Simonds, M.D    07/05/2016, 11:38 AM

## 2016-07-05 NOTE — Progress Notes (Addendum)
Inpatient Diabetes Program Recommendations  AACE/ADA: New Consensus Statement on Inpatient Glycemic Control (2015)  Target Ranges:  Prepandial:   less than 140 mg/dL      Peak postprandial:   less than 180 mg/dL (1-2 hours)      Critically ill patients:  140 - 180 mg/dL   Lab Results  Component Value Date   GLUCAP 318 (H) 07/05/2016    Review of Glycemic Control  Results for Jesse, [name] (MRN [id number]) as of 07/05/2016 08:00  Ref. Range 10/11/2014 10:12 07/05/2016 02:46 07/05/2016 07:40  Glucose-Capillary Latest Ref Range: 65 - 99 mg/dL 253 (H) 318 (H) 281 (H)   Diabetes history: Type 2 Outpatient Diabetes medications: Ordered Levemir 32 q night, Novolog 12 units tid, Glucophage 1012m bid- taking Metformin 503monce a day   Current orders for Inpatient glycemic control: Novolog 0-9 units tid, Novolog 0-5 units qhs  Inpatient Diabetes Program Recommendations:  Consider Lantus 16 units qam, Novolog 4 units tid, and increase Novolog correction to moderate scale 0-15 units tid.  Continue Novolog 0-5 qhs as ordered.  Met with patient and his significant other- he last saw Dr. AbGraceann Congressn October 2016.  He has not been taking medications as ordered, he has not been checking blood sugars but he has all new insulin and testing strips and a meter at home. Declined offer for dietitian, needs no prescriptions for medications. Declined further diabetes education.    JuGentry FitzRN, BA, MHA, CDE Diabetes Coordinator Inpatient Diabetes Program  [phone] Pager) [phone]/15/2018 8:02 AM

## 2016-07-05 NOTE — Progress Notes (Signed)
Name: Jesse HarperMichael L Talkington MRN: [id number] DOB: 1970/03/24    ADMISSION DATE:  07/05/2016  CHIEF COMPLAINT:  Chest pain  BRIEF PATIENT DESCRIPTION: 46 year old male with inferior wall MI s/p PCI to Mid RCA  SIGNIFICANT EVENTS  6/15>> Patient had inferior wall MI, S/P PCI to mid RCA  STUDIES:  07/05/16 Left heart cath and Coronary angiography>>STEMI inferior wall Successful PCI stent to mid RCA deployment of 2 overlapping DES stents reducing lesion from 90% down to 0 Diagnostic cardiac catheter of the left system which showed mild to moderate diffuse disease Left ventricular function appears to be mildly reduced Between 45-50% with mild inferior hypo   HISTORY OF PRESENT ILLNESS:  Eulis ManlyMichael Carpenter is a 46 year old male with known history of CAD,DM, Erectile dysfunction, Hypertension, MI(2004,2007) and sleep apnea. Patient presented to ED with some chest pain  Which has been going on for couple of weeks. Patient had some EKG changes which was concerning for inferior Wall MI.  Code STEMI was activated .  Patient had successful PCI  And stent placement to Mid RCA.  Post procedure patient was sent to the ICU for observation.  PAST MEDICAL HISTORY :   has a past medical history of Coronary artery disease; Diabetes mellitus; Erectile dysfunction; Hyperlipidemia; Hypertension; MI (myocardial infarction) (HCC) (4401,0272(2004,2007); Morbid obesity (HCC); and Sleep apnea.  has a past surgical history that includes Septoplasty and Cardiac catheterization. Prior to Admission medications   Medication Sig Start Date End Date Taking? Authorizing Provider  aspirin 81 MG EC tablet Take 81 mg by mouth daily.     Yes [provider]  atorvastatin (LIPITOR) 40 MG tablet Take 40 mg by mouth daily.     Yes [provider]  benazepril (LOTENSIN) 10 MG tablet Take 10 mg by mouth daily.   Yes [provider]  carvedilol (COREG) 25 MG tablet Take 25 mg by mouth 2 (two) times daily with a meal.      Yes [provider]  clopidogrel (PLAVIX) 75 MG tablet Take 75 mg by mouth daily.     Yes [provider]  ibuprofen (ADVIL,MOTRIN) 800 MG tablet Take 800 mg by mouth every 8 (eight) hours as needed.   Yes [provider]  insulin aspart (NOVOLOG) 100 UNIT/ML injection Inject into the skin 3 (three) times daily before meals.   Yes [provider]  insulin detemir (LEVEMIR) 100 UNIT/ML injection Inject into the skin daily.   Yes [provider]  metFORMIN (GLUCOPHAGE) 1000 MG tablet Take 1,000 mg by mouth 2 (two) times daily with a meal.   Yes [provider]  meclizine (ANTIVERT) 32 MG tablet Take 1 tablet (32 mg total) by mouth 3 (three) times daily as needed. 10/11/14   Jene EveryKinner, Robert, MD   No Known Allergies  FAMILY HISTORY:  family history is not on file. He was adopted. SOCIAL HISTORY:  reports that he has quit smoking. His smoking use included Cigarettes. He has a 30.00 pack-year smoking history. He has never used smokeless tobacco. He reports that he does not drink alcohol or use drugs.  REVIEW OF SYSTEMS:   Constitutional: Negative for fever, chills, weight loss, malaise/fatigue and diaphoresis.  HENT: Negative for hearing loss, ear pain, nosebleeds, congestion, sore throat, neck pain, tinnitus and ear discharge.   Eyes: Negative for blurred vision, double vision, photophobia, pain, discharge and redness.  Respiratory: Negative for cough, hemoptysis, sputum production, shortness of breath, wheezing and stridor.   Cardiovascular: Negative  for chest pain, palpitations, orthopnea, claudication, leg swelling and PND.  Gastrointestinal: Negative for heartburn, nausea, vomiting, abdominal pain, diarrhea, constipation, blood in stool and melena.  Genitourinary: Negative for dysuria, urgency, frequency, hematuria and flank pain.  Musculoskeletal: Negative for myalgias, back pain, joint pain and falls.  Skin: Negative for itching and rash.    Neurological: Negative for dizziness, tingling, tremors, sensory change, speech change, focal weakness, seizures, loss of consciousness, weakness and headaches.  Endo/Heme/Allergies: Negative for environmental allergies and polydipsia. Does not bruise/bleed easily.  SUBJECTIVE: Patient states "that he is feeling better"  VITAL SIGNS: Temp:  [98.2 F (36.8 C)-98.3 F (36.8 C)] 98.3 F (36.8 C) (06/15 0435) Pulse Rate:  [77-87] 77 (06/15 0435) Resp:  [18-24] 18 (06/15 0435) BP: (143-162)/(88-108) 143/88 (06/15 0435) SpO2:  [98 %-100 %] 99 % (06/15 0435) Weight:  [117.9 kg (259 lb 14.8 oz)-117.9 kg (260 lb)] 117.9 kg (259 lb 14.8 oz) (06/15 0435)  PHYSICAL EXAMINATION: General:  Middle aged male, found on bed, in no acute distress Neuro:  Awake,alert,oriented HEENT:  AT,[place], No JVD,PERRLA Cardiovascular:  S1 S2,Regular, no m/r/g noted Lungs:  Clear bilaterally, no wheezes,crackles,rhonchi Abdomen: soft,nontender Musculoskeletal:  No edema,cyanosis noted Skin: warm ,dry and intact   Recent Labs Lab 07/05/16 0209  NA 134*  K 3.9  CL 99*  CO2 26  BUN 14  CREATININE 1.10  GLUCOSE 291*    Recent Labs Lab 07/05/16 0209  HGB 16.1  HCT 48.6  WBC 12.6*  PLT 345   No results found.  ASSESSMENT / PLAN:  STEMI Inferior Wall MI S/P PCI stent to mid RCA Hx of Hypertension/hyperlipidemia Hx of DM  Plan Continue Bival per cards recommendation Continue aspirin/plavix Continue Lipitor Blood glucose checks with SSI coverage Continue metoprolol/lisinopril    Tauni Sanks,AG-ACNP Pulmonary and Critical Care Medicine [facility]   07/05/2016, 5:12 AM

## 2016-07-05 NOTE — ED Provider Notes (Signed)
[facility] Emergency Department Provider Note   ____________________________________________   First MD Initiated Contact with Patient 07/05/16 0207     (approximate)  I have reviewed the triage vital signs and the nursing notes.   HISTORY  Chief Complaint Chest Pain    HPI Jesse [name] is a 46 y.o. male who comes into the hospital today with some chest pain. He reports that he's had this pain on and off for the past couple of weeks. He reports it's a burning pain but he sometimes has pain in his neck. He reports that it's been coming and going. Right now he rates the pain is 0 out of 10 in intensity. He denies any nausea or vomiting sweats or shortness of breath. He has had 2 previous heart attacks with stents in the past. The patient called the ambulance tonight for further evaluation   Past Medical History:  Diagnosis Date  . Coronary artery disease    Inferior myocardial infarction in 2004.  He was treated with thrombolytics at [facility].  He then had an inferior MI in March 2007.  He received a Horizon study stent 3.5 x 20 mm in the distal RCA at [facility].  No other obstructive disease.  . Diabetes mellitus    TYPE II  . Erectile dysfunction   . Hyperlipidemia   . Hypertension   . MI (myocardial infarction) (HCC) 4098,1191  . Morbid obesity (HCC)   . Sleep apnea     Patient Active Problem List   Diagnosis Date Noted  . DYSPNEA 09/28/2008  . HYPERLIPIDEMIA-MIXED 08/13/2008  . OBESITY-MORBID (>100') 08/13/2008  . HYPERTENSION, BENIGN 08/13/2008  . CAD, NATIVE VESSEL 08/13/2008    Past Surgical History:  Procedure Laterality Date  . CARDIAC CATHETERIZATION     @ [facility]  . SEPTOPLASTY      Prior to Admission medications   Medication Sig Start Date End Date Taking? Authorizing Provider  aspirin 81 MG EC tablet Take 81 mg by mouth daily.     Yes [provider]  atorvastatin (LIPITOR) 40 MG tablet Take 40 mg by mouth daily.      Yes [provider]  benazepril (LOTENSIN) 10 MG tablet Take 10 mg by mouth daily.   Yes [provider]  carvedilol (COREG) 25 MG tablet Take 25 mg by mouth 2 (two) times daily with a meal.     Yes [provider]  clopidogrel (PLAVIX) 75 MG tablet Take 75 mg by mouth daily.     Yes [provider]  ibuprofen (ADVIL,MOTRIN) 800 MG tablet Take 800 mg by mouth every 8 (eight) hours as needed.   Yes [provider]  insulin aspart (NOVOLOG) 100 UNIT/ML injection Inject into the skin 3 (three) times daily before meals.   Yes [provider]  insulin detemir (LEVEMIR) 100 UNIT/ML injection Inject into the skin daily.   Yes [provider]  metFORMIN (GLUCOPHAGE) 1000 MG tablet Take 1,000 mg by mouth 2 (two) times daily with a meal.   Yes [provider]  meclizine (ANTIVERT) 32 MG tablet Take 1 tablet (32 mg total) by mouth 3 (three) times daily as needed. 10/11/14   Jene Every, MD    Allergies Patient has no known allergies.  Family History  Problem Relation Age of Onset  . Adopted: Yes    Social History Social History  Substance Use Topics  . Smoking status: Former Smoker    Packs/day: 2.00    Years:  15.00    Types: Cigarettes  . Smokeless tobacco: Never Used  . Alcohol use No    Review of Systems  Constitutional: No fever/chills Eyes: No visual changes. ENT: No sore throat. Cardiovascular:  chest pain. Respiratory: Denies shortness of breath. Gastrointestinal: No abdominal pain.  No nausea, no vomiting.  No diarrhea.  No constipation. Genitourinary: Negative for dysuria. Musculoskeletal: Negative for back pain. Skin: Negative for rash. Neurological: Negative for headaches, focal weakness or numbness.   ____________________________________________   PHYSICAL EXAM:  VITAL SIGNS: ED Triage Vitals  Enc Vitals Group     BP 07/05/16 0207 (!) 162/105     Pulse Rate 07/05/16 0207 84     Resp  07/05/16 0207 (!) 24     Temp 07/05/16 0207 98.2 F (36.8 C)     Temp Source 07/05/16 0207 Oral     SpO2 07/05/16 0207 99 %     Weight 07/05/16 0210 260 lb (117.9 kg)     Height 07/05/16 0210 5\' 11"  (1.803 m)     Head Circumference --      Peak Flow --      Pain Score --      Pain Loc --      Pain Edu? --      Excl. in GC? --     Constitutional: Alert and oriented. Well appearing and in mild distress. Eyes: Conjunctivae are normal. PERRL. EOMI. Head: Atraumatic. Nose: No congestion/rhinnorhea. Mouth/Throat: Mucous membranes are moist.  Oropharynx non-erythematous. Cardiovascular: Normal rate, regular rhythm. Grossly normal heart sounds.  Good peripheral circulation. Respiratory: Normal respiratory effort.  No retractions. Lungs CTAB. Gastrointestinal: Soft and nontender. No distention. Positive bowel sounds Musculoskeletal: No lower extremity tenderness nor edema.   Neurologic:  Normal speech and language.  Skin:  Skin is warm, dry and intact.  Psychiatric: Mood and affect are normal.   ____________________________________________   LABS (all labs ordered are listed, but only abnormal results are displayed)  Labs Reviewed  CBC - Abnormal; Notable for the following:       Result Value   WBC 12.6 (*)    All other components within normal limits  BASIC METABOLIC PANEL - Abnormal; Notable for the following:    Sodium 134 (*)    Chloride 99 (*)    Glucose, Bld 291 (*)    All other components within normal limits  GLUCOSE, CAPILLARY - Abnormal; Notable for the following:    Glucose-Capillary 318 (*)    All other components within normal limits  TROPONIN I  PROTIME-INR  APTT   ____________________________________________  EKG  ED ECG REPORT I, Rebecka Apley, the attending physician, personally viewed and interpreted this ECG.   Date: 07/05/2016  EKG Time: 203  Rate: 84  Rhythm: normal sinus rhythm  Axis: Normal  Intervals:none  ST&T Change: ST segment  elevation in leads 2, 3, aVF as well as V6 with some slight depression in leads I and aVL  ____________________________________________  RADIOLOGY  No results found.  ____________________________________________   PROCEDURES  Procedure(s) performed: None  Procedures  Critical Care performed: Yes, see critical care note(s)  ____________________________________________   INITIAL IMPRESSION / ASSESSMENT AND PLAN / ED COURSE  Pertinent labs & imaging results that were available during my care of the patient were reviewed by me and considered in my medical decision making (see chart for details).  This is a 46 year old male who comes into the hospital today with some chest pain. Looking at the patient's EKG it  appears that he may be having an ST segment elevation MI. The patient did receive aspirin by EMS. I will give the patient some heparin and await the phone call returned from cardiology. I did activate a code STEMI. The patient will also receive 300 mg of Plavix. I discussed the case with Dr. Juliann Paresallwood and the patient will be taken to the catheter lab.      ____________________________________________   FINAL CLINICAL IMPRESSION(S) / ED DIAGNOSES  Final diagnoses:  ST elevation myocardial infarction (STEMI), unspecified artery (HCC)      NEW MEDICATIONS STARTED DURING THIS VISIT:  New Prescriptions   No medications on file     Note:  This document was prepared using Dragon voice recognition software and may include unintentional dictation errors.    Rebecka ApleyWebster, Verne Lanuza P, MD 07/05/16 [phone]

## 2016-07-05 NOTE — Progress Notes (Signed)
[employer] [facility] [place] [facility]   PATIENT NAME: Jesse ManlyMichael Puskas    MR#:  [id number]  DATE OF BIRTH:  08/23/1970  SUBJECTIVE:  CHIEF COMPLAINT:   Chief Complaint  Patient presents with  . Chest Pain   Came with chest pain and found ST elevation MI so urgent cardiac catheterization was done and RCA stent is placed.  Patient was monitored and ICU initially and was denying any complaints. REVIEW OF SYSTEMS:  CONSTITUTIONAL: No fever, fatigue or weakness.  EYES: No blurred or double vision.  EARS, NOSE, AND THROAT: No tinnitus or ear pain.  RESPIRATORY: No cough, shortness of breath, wheezing or hemoptysis.  CARDIOVASCULAR: No chest pain, orthopnea, edema.  GASTROINTESTINAL: No nausea, vomiting, diarrhea or abdominal pain.  GENITOURINARY: No dysuria, hematuria.  ENDOCRINE: No polyuria, nocturia,  HEMATOLOGY: No anemia, easy bruising or bleeding SKIN: No rash or lesion. MUSCULOSKELETAL: No joint pain or arthritis.   NEUROLOGIC: No tingling, numbness, weakness.  PSYCHIATRY: No anxiety or depression.   ROS  DRUG ALLERGIES:  No Known Allergies  VITALS:  Blood pressure (!) 136/97, pulse 71, temperature 97.9 F (36.6 C), temperature source Oral, resp. rate 17, height 5\' 11"  (1.803 m), weight 117.9 kg (259 lb 14.8 oz), SpO2 96 %.  PHYSICAL EXAMINATION:  GENERAL:  46 y.o.-year-old patient lying in the bed with no acute distress.  EYES: Pupils equal, round, reactive to light and accommodation. No scleral icterus. Extraocular muscles intact.  HEENT: Head atraumatic, normocephalic. Oropharynx and nasopharynx clear.  NECK:  Supple, no jugular venous distention. No thyroid enlargement, no tenderness.  LUNGS: Normal breath sounds bilaterally, no wheezing, rales,rhonchi or crepitation. No use of accessory muscles of respiration.  CARDIOVASCULAR: S1, S2 normal. No murmurs, rubs, or gallops.  ABDOMEN: Soft, nontender, nondistended. Bowel sounds present. No organomegaly or  mass.  EXTREMITIES: No pedal edema, cyanosis, or clubbing.  NEUROLOGIC: Cranial nerves II through XII are intact. Muscle strength 5/5 in all extremities. Sensation intact. Gait not checked.  PSYCHIATRIC: The patient is alert and oriented x 3.  SKIN: No obvious rash, lesion, or ulcer.   Physical Exam LABORATORY PANEL:   CBC  Recent Labs Lab 07/05/16 0209  WBC 12.6*  HGB 16.1  HCT 48.6  PLT 345   ------------------------------------------------------------------------------------------------------------------  Chemistries   Recent Labs Lab 07/05/16 0209  NA 134*  K 3.9  CL 99*  CO2 26  GLUCOSE 291*  BUN 14  CREATININE 1.10  CALCIUM 8.9   ------------------------------------------------------------------------------------------------------------------  Cardiac Enzymes  Recent Labs Lab 07/05/16 0428 07/05/16 1024  TROPONINI <0.03 <0.03   ------------------------------------------------------------------------------------------------------------------  RADIOLOGY:  Dg Chest 1 View  Result Date: 07/05/2016 CLINICAL DATA:  Heart failure EXAM: CHEST 1 VIEW COMPARISON:  04/29/2006 FINDINGS: Cardiac shadow it is stable at the upper limits of normal. The lungs are well aerated bilaterally. No focal infiltrate or sizable effusion is seen. No bony abnormality is noted. IMPRESSION: No acute abnormality seen. Electronically Signed   By: Alcide CleverMark  Lukens M.D.   On: 07/05/2016 07:15    ASSESSMENT AND PLAN:   Principal Problem:   STEMI involving right coronary artery [facility](HCC) Active Problems:   Obese   Essential hypertension   CAD, NATIVE VESSEL   Ischemic cardiomyopathy   Polysubstance abuse   H/O noncompliance with medical treatment, presenting hazards to health   Poorly controlled diabetes mellitus (HCC)  1. Inferior ST elevation MI/CAD as above: -No further chest pain -Status post PCI/DES x 2 overlapping stents as above -Has been placed on DAPT  with ASA 81mg  daily and  Plavix 75 mg daily given issues with finances and compliance. Discussed with MD, will continue Plavix in place of Brilinta -Check post intervention TTE -Check lipid panel for further risk stratification -Recommend cardiac rehab -Lipitor 80 mg daily  2. ICM: -He does not appear to be grossly volume overloaded at this time -Check TTE as above -Continue Lopressor 25 mg bid and lisinopril 5 mg daily -Daily weights and strict IS and Os -CHF education  3. Polysubstance abuse: -Reports abusing cocaine ~ 20 years prior with a long history of tobacco abuse and ongoing vaping -Check urine drug scree -Cessation is advised  4. Poorly controlled DM/hyperglycemia: -A1c pending -Hold metformin x 48 hours s/p LHC  5. HTN: -Increase Lopressor to 50 mg bid -Lisinopril 5 mg daily  6. Medication noncompliance: -Compliance is advised  -Without significant lifestyle changes and compliance he will likely unfortunately have a poor outcome  7. Obesity: -Weight loss advised -Recommend cardiac rehab as above   All the records are reviewed and case discussed with Care Management/Social Workerr. Management plans discussed with the patient, family and they are in agreement.  CODE STATUS: Full.  TOTAL TIME TAKING CARE OF THIS PATIENT: 35 minutes.  Pt's wife was present in room during my visit.   POSSIBLE D/C IN 1-2 DAYS, DEPENDING ON CLINICAL CONDITION.   Altamese Dilling M.D on 07/05/2016   Between 7am to 6pm - Pager - [phone]  After 6pm go to [email or web address] - password [email or web address]  [facility]  Office  [phone]  CC: Primary care physician; Leanna Sato, MD  Note: This dictation was prepared with Dragon dictation along with smaller phrase technology. Any transcriptional errors that result from this process are unintentional.

## 2016-07-05 NOTE — ED Triage Notes (Addendum)
Pt presents to ED via [facility] from home c/o intermittent chest "burning" x 2 weeks accompanied by bilateral arm weakness and tingling. Pain increases with exertion. Pt reports tonight pain/burning sensation lasted longer than before. Per EMS pt stopped taking medications for diabetes, HTN, along with other meds for 2 months. States began to take meds again 2 days ago. EMS administered 324 Aspirin en route. EMS initial bp 178/100, recheck was 158/76. Per EMS EKG unremarkable, right sided EKG unremarkable as well. Pt denies pain at this time, denies sob, diaphoresis, or headache. Pt alert and oriented x 4, respiration even and unlabored. Pt reports hx of HTN and 2 MI.

## 2016-07-05 NOTE — Progress Notes (Signed)
Patient arrived to unit, transferred to the bed and laying flat. Level 0 noted to right groin where mynx closure is located. Positive pedal pulses checked per protocol. Wife at bedside. Patient denies pain, no shortness of breath. Vitals stable and patient on room air. Tolerating gingerale and voiding without difficulty.  Cardiologist, Hospitalist and NP all in to see patient.

## 2016-07-05 NOTE — Consult Note (Signed)
Cardiology Consultation Note  Patient ID: Jesse [name], MRN: [id number], DOB/AGE: [birth year] 46 y.o. Admit date: 07/05/2016   Date of Consult: 07/05/2016 Primary Physician: [name], MD Primary Cardiologist: New to [facility] - previously followed by Dr. Aundra Dubin in early 2000s, most recently seen by Dr. Fletcher Anon in 2013, lost to follow up since Requesting Physician: Dr. Anselm Jungling, MD  Chief Complaint: Chest pain/burning Reason for Consult: Inferior ST elevation MI s/p emergent PCI with overlapping DES x 2 in the RCA by Dr. [name], MD  HPI: Jesse [name] is a 46 y.o. male who is being seen today for the evaluation of  Inferior ST elevation MI s/p emergent PCI with overlapping DES x 2 in the RCA by Dr. [name], MD  at the request of Dr. Anselm Jungling, MD. Patient has a h/o CAD with inferior MI in 2004 and 2007, polysubstance abuse with reported prior cocaine abuse ~ 20 years prior, tobacco abuse for at elast 20 years at 2 packs daily with ongoing vaping, poorly controlled DM2, HTN, HLD, medication noncompliance, and obesity who presented to [facility] overnight with 2 weeks of intermittent chest burning and was found to have an inferior ST elevation MI.  Patient has been lost to follow up since 07/2011. Prior inferior MI in 2004 treated with thrombolytics and MI in 2007 treated with stenting of the RCA. Normal EF at that time. He was apparently placed on full disability at the time of one of these MIs for unclear reasons. He reports taking ASA and Plavix for a "while after his 2013 visit though self discontinued. He was last evaluated as an outpatient in 12/2014 by orthopedics for shoulder injury.   Over the past 2 weeks he has noted intermittent upper chest burning on the right and left sides that radiated to his neck and shoulder. Pain would occur at rest and with exertion. Pain had become progressively worse and more persistent on 07/04/16 prompting him to come in for evaluation. Of nto, patient states he  recently restarted Plavix and ASA at the time he started having the above pain, though it is unclear if this was an old Rx or a new one. Patient and his wife state a new Plavix RX, there is no documentation in Epic of this. Because of his ongoing chest pain he called EMS> per notes, EKG in the field was not acute. He was given full dose ASA by EMS.    ED MD first made contact with patient at 2:07 AM. Code STEMI was called to Dr. [name] who came in and took the patient to the cath lab. Per cath lab he was taken for intervention around 2:30 AM.   Upon the patient's arrival to [facility] they were found to have BP 162/105, HR 88 bpm, temp 98.2, oxygen saturation 99% on room air, weight 260 pounds. EKG showed 2:03 AM with NSR, 84 bpm, inferior ST elevation of 2 mm along with anterolateral ST elevation of 1 mm, st depression aVL, CXR showed no acute abnormality. Labs showed troponin negative x 2, SCr 1.10, K+ 3.9, Na 134, WBC 12.6, HGB 16.1, PLT 345. Emergent LHC showed mid to distal RCA 100% stenosed, distal RCA 100 stenosed. He underwent successful PCI/DES x 2 with overlapping Xience Alpine stents as detailed below. Remaining cath details showed mid to distal LAD 20% stenosed, distal LCx 50% stenosed, OM4 75% stenosed, RPDA 90%, stenosed. Post intervention there was not further stenosis of the stented lesions. Mild LV systolic dysfunction with an estimated EF  of 45-50% with normal LVEDP. He was loaded with Plavix 300 mg x 1 and continued on ASA 81 mg and Plavix 75 mg daily. Post procedure he has not had any further chest pain. BP ha remained in the 161W systolic. He wants to go home and is threatening to leave AMA. Telemetry has not shown any evidence of arrhythmia.   Past Medical History:  Diagnosis Date  . Coronary artery disease    Inferior myocardial infarction in 2004.  He was treated with thrombolytics at [facility].  He then had an inferior MI in March 2007.  He received a Horizon study stent 3.5 x 20 mm in the  distal RCA at [facility].  No other obstructive disease.  . Diabetes mellitus    TYPE II  . Erectile dysfunction   . Hyperlipidemia   . Hypertension   . MI (myocardial infarction) ([place]) [phone]  . Morbid obesity ([place])   . Sleep apnea       Most Recent Cardiac Studies: LHC 06/25/16: Coronary Findings   Dominance: Left  Left Anterior Descending  Mid LAD to Dist LAD lesion, 20% stenosed. Not the culprit lesion. The lesion is type B1, located at the major branch, tubular and concentric. The lesion was not previously treated. The stenosis was measured by a visual reading. Pressure wire/FFR was not performed on the lesion. IVUS was not performed on the lesion.  Left Circumflex  Dist Cx lesion, 50% stenosed. Not the culprit lesion. The lesion is type B2, located proximal to the major branch, segmental, eccentric, concentric and irregular. The lesion was not previously treated. The stenosis was measured by a visual reading. Pressure wire/FFR was not performed on the lesion. IVUS was not performed on the lesion.  Fourth Obtuse Marginal Branch  4th Mrg lesion, 75% stenosed. Not the culprit lesion. The lesion is type B2, distal to major branch, tubular, concentric and irregular. The lesion was not previously treated. The stenosis was measured by a visual reading. Pressure wire/FFR was not performed on the lesion. IVUS was not performed on the lesion.  Right Coronary Artery  Mid RCA to Dist RCA lesion, 100% stenosed. Culprit lesion. The lesion is type C, located at the major branch and eccentric. The lesion was not previously treated. The stenosis was measured by a visual reading. Pressure wire/FFR was not performed on the lesion. IVUS was not performed on the lesion.  Angioplasty: Lesion length: 20 mm. Lesion crossed with guidewire using a WIRE G HI TQ BMW 190. Pre-stent angioplasty was performed using a BALLOON TREK RX 2.5X15. Maximum pressure: 8 atm. Inflation time: 10 sec. A STENT XIENCE ALPINE RX  3.0X23 drug eluting stent was successfully placed, and overlaps previously placed stent. Minimum lumen area: 3.3 mm. Stent strut is well apposed. Post-stent angioplasty was not performed. Pre-stent angioplasty was performed using a BALLOON TREK RX 2.5X15. Maximum pressure: 8 atm. Inflation time: 10 sec. A STENT XIENCE ALPINE RX [id number] stent was successfully placed. Minimum lumen area: 3.3 mm. Stent strut is well apposed. Stent 2 overlaps at the proximal edge. Post-stent angioplasty was not performed was performed. There is no pre-interventional antegrade distal flow (TIMI 0). The post-interventional distal flow is normal (TIMI 3). The intervention was successful . No complications occurred at this lesion. Pressure wire/FFR was not performed on the lesion . IVUS was not performed on the lesion .  There is no residual stenosis post intervention.  Dist RCA lesion, 100% stenosed. Not the culprit lesion. The lesion is type A  and located at the major branch. The lesion was previously treated using a drug eluting stent over 2 years ago. Previously placed stent displays no rethrombosis. Previously placed stent displays no restenosis. The stenosis was measured by a visual reading. Pressure wire/FFR was not performed on the lesion. IVUS was not performed on the lesion.  Angioplasty: Lesion length: 20 mm. Lesion crossed with guidewire using a WIRE G HI TQ BMW 190. Pre-stent angioplasty was performed using a BALLOON TREK RX 2.5X15. Maximum pressure: 8 atm. Inflation time: 10 sec. A STENT XIENCE ALPINE RX 3.0X23 drug eluting stent was successfully placed, and overlaps previously placed stent. Minimum lumen area: 3.3 mm. Stent strut is well apposed. Post-stent angioplasty was not performed. Pre-stent angioplasty was performed using a BALLOON TREK RX 2.5X15. Maximum pressure: 8 atm. Inflation time: 10 sec. A STENT XIENCE ALPINE RX [id number] stent was successfully placed. Minimum lumen area: 3.3 mm. Stent strut is well  apposed. Stent 2 overlaps at the proximal edge. Post-stent angioplasty was not performed was performed. There is no pre-interventional antegrade distal flow (TIMI 0). The post-interventional distal flow is normal (TIMI 3). The intervention was successful . No complications occurred at this lesion. Pressure wire/FFR was not performed on the lesion . IVUS was not performed on the lesion .  There is no residual stenosis post intervention.  Right Posterior Descending Artery  RPDA lesion, 90% stenosed. Not the culprit lesion. The lesion is type B1, located at the major branch, serial and irregular. The lesion was not previously treated. The stenosis was measured by a visual reading. Pressure wire/FFR was not performed on the lesion. IVUS was not performed on the lesion.  Wall Motion      Mild inferior hypo-ejection fraction of 45-50%          Left Heart   Left Ventricle The left ventricular size is in the upper limits of normal. There is mild left ventricular systolic dysfunction. LV end diastolic pressure is normal. Calculated EF is 50%. The left ventricular ejection fraction is 45-50% by visual estimate. There are LV function abnormalities due to segmental dysfunction. Mild inferior hypokinesis ejection fraction between 45-50%    Coronary Diagrams   Diagnostic Diagram       Post-Intervention Diagram          Surgical History:  Past Surgical History:  Procedure Laterality Date  . CARDIAC CATHETERIZATION     @ [place]  . LEFT HEART CATH AND CORONARY ANGIOGRAPHY N/A 07/05/2016   Procedure: Left Heart Cath and Coronary Angiography;  Surgeon: Yolonda Kida, MD;  Location: [place];  Service: Cardiovascular;  Laterality: N/A;  . SEPTOPLASTY       Home Meds: Prior to Admission medications   Medication Sig Start Date End Date Taking? Authorizing Provider  aspirin 81 MG EC tablet Take 81 mg by mouth daily.     Yes [provider]  atorvastatin (LIPITOR) 40 MG  tablet Take 40 mg by mouth daily.     Yes [provider]  benazepril (LOTENSIN) 10 MG tablet Take 10 mg by mouth daily.   Yes [provider]  carvedilol (COREG) 25 MG tablet Take 25 mg by mouth 2 (two) times daily with a meal.     Yes [provider]  clopidogrel (PLAVIX) 75 MG tablet Take 75 mg by mouth daily.     Yes [provider]  ibuprofen (ADVIL,MOTRIN) 800 MG tablet Take 800 mg by mouth every 8 (eight) hours as needed.  Yes [provider]  insulin aspart (NOVOLOG) 100 UNIT/ML injection Inject 5 Units into the skin 3 (three) times daily with meals.    Yes [provider]  insulin detemir (LEVEMIR) 100 UNIT/ML injection Inject 20 Units into the skin at bedtime.    Yes [provider]  metFORMIN (GLUCOPHAGE) 1000 MG tablet Take 1,000 mg by mouth 2 (two) times daily with a meal.   Yes [provider]  meclizine (ANTIVERT) 32 MG tablet Take 1 tablet (32 mg total) by mouth 3 (three) times daily as needed. Patient not taking: Reported on 07/05/2016 10/11/14   Lavonia Drafts, MD    Inpatient Medications:  . aspirin  81 mg Oral Daily  . atorvastatin  80 mg Oral q1800  . clopidogrel  75 mg Oral Q breakfast  . docusate sodium  100 mg Oral BID  . enoxaparin (LOVENOX) injection  40 mg Subcutaneous Q24H  . insulin aspart  0-5 Units Subcutaneous QHS  . insulin aspart  0-9 Units Subcutaneous TID WC  . lisinopril  5 mg Oral Daily  . metoprolol tartrate  25 mg Oral BID  . sodium chloride flush  3 mL Intravenous Q12H   . sodium chloride    . sodium chloride 1 mL/kg/hr (07/05/16 0900)    Allergies: No Known Allergies  Social History   Social History  . Marital status: Single    Spouse name: N/A  . Number of children: N/A  . Years of education: N/A   Occupational History  . Not on file.   Social History Main Topics  . Smoking status: Former Smoker    Packs/day: 2.00    Years: 15.00    Types: Cigarettes  .  Smokeless tobacco: Never Used  . Alcohol use No  . Drug use: No     Comment: prior hx of cocaine and marijuana  . Sexual activity: Not on file   Other Topics Concern  . Not on file   Social History Narrative  . No narrative on file     Family History  Problem Relation Age of Onset  . Adopted: Yes     Review of Systems: Review of Systems  Constitutional: Positive for malaise/fatigue. Negative for chills, diaphoresis, fever and weight loss.  HENT: Negative for congestion.   Eyes: Negative for discharge and redness.  Respiratory: Negative for cough, hemoptysis, sputum production, shortness of breath and wheezing.   Cardiovascular: Positive for chest pain. Negative for palpitations, orthopnea, claudication, leg swelling and PND.       Anterior chest wall right and left side burning   Gastrointestinal: Negative for abdominal pain, blood in stool, heartburn, melena, nausea and vomiting.  Genitourinary: Negative for hematuria.  Musculoskeletal: Negative for falls and myalgias.  Skin: Negative for rash.  Neurological: Positive for weakness. Negative for dizziness, tingling, tremors, sensory change, speech change, focal weakness and loss of consciousness.  Endo/Heme/Allergies: Does not bruise/bleed easily.  Psychiatric/Behavioral: Positive for substance abuse. The patient is not nervous/anxious.   All other systems reviewed and are negative.   Labs:  Recent Labs  07/05/16 0209 07/05/16 0428  TROPONINI <0.03 <0.03   Lab Results  Component Value Date   WBC 12.6 (H) 07/05/2016   HGB 16.1 07/05/2016   HCT 48.6 07/05/2016   MCV 82.7 07/05/2016   PLT 345 07/05/2016     Recent Labs Lab 07/05/16 0209  NA 134*  K 3.9  CL 99*  CO2 26  BUN 14  CREATININE 1.10  CALCIUM 8.9  GLUCOSE 291*   Lab Results  Component Value Date   CHOL 111 10/13/2009   HDL 29 (L) 10/13/2009   LDLCALC 51 10/13/2009   TRIG 155 (H) 10/13/2009   No results found for:  DDIMER  Radiology/Studies:  Dg Chest 1 View  Result Date: 07/05/2016 CLINICAL DATA:  Heart failure EXAM: CHEST 1 VIEW COMPARISON:  04/29/2006 FINDINGS: Cardiac shadow it is stable at the upper limits of normal. The lungs are well aerated bilaterally. No focal infiltrate or sizable effusion is seen. No bony abnormality is noted. IMPRESSION: No acute abnormality seen. Electronically Signed   By: Inez Catalina M.D.   On: 07/05/2016 07:15    EKG: Interpreted by me showed: 2:03 AM with NSR, 84 bpm, inferior ST elevation of 2 mm along with anterolateral ST elevation of 1 mm, st depression aVL. Post intervention EKG at 4:43 AM showed NSR, 75 bpm, improved inferior ST elevation along with improved lateral ST elevation, nonspecific anterior st elevation persisted, nonspecific st/t changes lead III, resolved TWI aVL Telemetry: Interpreted by me showed: NSR, 70s bpm  Weights: Filed Weights   07/05/16 0210 07/05/16 0435  Weight: 260 lb (117.9 kg) 259 lb 14.8 oz (117.9 kg)     Physical Exam: Blood pressure 128/83, pulse 82, temperature 97.9 F (36.6 C), temperature source Oral, resp. rate 20, height 5' 11"  (1.803 m), weight 259 lb 14.8 oz (117.9 kg), SpO2 94 %. Body mass index is 36.25 kg/m. General: Well developed, well nourished, in no acute distress. Head: Normocephalic, atraumatic, sclera non-icteric, no xanthomas, nares are without discharge.  Neck: Negative for carotid bruits. JVD not elevated. Lungs: Clear bilaterally to auscultation without wheezes, rales, or rhonchi. Breathing is unlabored. Heart: RRR with S1 S2. No murmurs, rubs, or gallops appreciated. Right femoral cath site without bleeding, bruising, swelling, erythema, TTP, or bruit. Femoral pulse 2+. Abdomen: Soft, non-tender, non-distended with normoactive bowel sounds. No hepatomegaly. No rebound/guarding. No obvious abdominal masses. Msk:  Strength and tone appear normal for age. Extremities: No clubbing or cyanosis. No edema.  Distal pedal pulses are 2+ and equal bilaterally. Multiple tattoos noted.  Neuro: Alert and oriented X 3. No facial asymmetry. No focal deficit. Moves all extremities spontaneously. Psych:  Responds to questions appropriately with a normal affect.    Assessment and Plan:  Principal Problem:   STEMI involving right coronary artery (HCC) Active Problems:   CAD, NATIVE VESSEL   Ischemic cardiomyopathy   Polysubstance abuse   H/O noncompliance with medical treatment, presenting hazards to health   Poorly controlled diabetes mellitus ([place])   Obese   Essential hypertension    1. Inferior ST elevation MI/CAD as above: -No further chest pain -Status post PCI/DES x 2 overlapping stents as above -Has been placed on DAPT with ASA 75m daily and Plavix 75 mg daily given issues with finances and compliance. Discussed with MD, will continue Plavix in place of Brilinta -Check post intervention TTE -Check lipid panel for further risk stratification -Recommend cardiac rehab -Lipitor 80 mg daily  2. ICM: -He does not appear to be grossly volume overloaded at this time -Check TTE as above -Continue Lopressor 25 mg bid and lisinopril 5 mg daily -Daily weights and strict IS and Os -CHF education  3. Polysubstance abuse: -Reports abusing cocaine ~ 20 years prior with a long history of tobacco abuse and ongoing vaping -Check urine drug scree -Cessation is advised  4. Poorly controlled DM/hyperglycemia: -A1c pending -Per IM -Hold metformin x 48 hours s/p LHC  5. HTN: -Increase Lopressor to 50 mg bid -Lisinopril 5 mg daily  6. Medication noncompliance: -Compliance is advised  -Without significant lifestyle changes and compliance he will likely unfortunately have a poor outcome  7. Obesity: -Weight loss advised -Recommend cardiac rehab as above   Signed, Christell Faith, PA-C [place] Pager: [phone] 07/05/2016, 10:27 AM  Attending Note Patient seen and examined, agree  with detailed note above,  Patient presentation and plan discussed on rounds.  Cardiology consult placed by Dr.  [name] for STEMI  EKG lab work, chest x-ray  reviewed independently by myself  46 year old gentleman with known coronary artery disease, previous stenting to the RCA, polysubstance abuse including cocaine, smoking, poor control diabetes, hypertension, hyperlipidemia, who presented to hospital with 2 weeks of burning chest discomfort, escalating symptoms, EKG in the emergency room showing inferior wall STEMI,  taken to the cardiac catheterization lab with 2 overlapping stents placed to his mid to distal RCA  Currently pain free, burning has resolved Wife present at the bedside, he reports worsening symptoms prompting him to call EMS at 2 AM  On arrival he was hypertensive, inferior ST elevation 2 mm, Catheterization showed occluded mid RCA, Xience Alpine stents 2 Loaded with Plavix 300 mg 1 He wants to go home   On physical exam he is comfortable, sleeping, no JVD, lungs clear to auscultation bilaterally, heart sounds regular with no murmurs appreciated, abdomen soft nontender, no significant lower extremity edema  Lab work reviewed showing troponin less than 0.03 EKG personally reviewed by myself showing NSR, 84 bpm, inferior ST elevation of 2 mm along with anterolateral ST elevation of 1 mm, st depression aVL.    ----STEMI 2 overlapping stents to the mid to distal RCA Chest burning resolved On aspirin Plavix Recommended he stay at least overnight. He has indicated he would like to leave Encouraging as troponins have not changed Would continue aspirin Plavix, statin  recommended he be compliant with his medications Smoking cessation Also on lisinopril, metoprolol  ----Diabetes type 2 On insulin   Greater than 50% was spent in counseling and coordination of care with patient Total encounter time 110 minutes or more   Signed: Esmond Plants  M.D., Ph.D. [facility]

## 2016-07-05 NOTE — Progress Notes (Signed)
[facility] no supp O2 required for transport, VSS, pt A&O x 4, no pain issues, chart meds and belongings with him to 2A, transport via wheelchair.

## 2016-07-05 NOTE — Progress Notes (Signed)
Per Dr Ardyth Manam and cardiology, transfer pt to 2A

## 2016-07-05 NOTE — Care Management Note (Signed)
Case Management Note  Patient Details  Name: Shannan HarperMichael L Stefan MRN: [id number] Date of Birth: 11-02-1970  Subjective/Objective:                  Spoke with patient wife Carollee HerterShannon regarding discharge planning. She states that patient's PCP is [facility]reld [facility]inda [facility]. She states that patient has a good relationship with MD and that he can afford his medications. He has a working glucometer at home. He is independent with daily activities and able to drive. Carollee HerterShannon [facility] to [facility] following up appointment on any date and any time.  Action/Plan: RNCM [facility]. Patient had appointment on Tuesday and was a no-show. They looked back further and patient has no-showed to multiple appointments and therefore he must show up or call to cancel for the next appointment or they will not be able to see him again.  Follow up appointment made for 07/16/16 at 2:10PM. [facility] and agrees. I stressed the importance of taking responsibility of his heart and health and not missing any more appointments.  Expected Discharge Date:                  Expected Discharge Plan:     In-House Referral:  PCP / [facility]  Discharge planning Services  CM Consult  Post Acute Care Choice:    Choice offered to:  Spouse  DME Arranged:    DME Agency:     HH Arranged:    HH Agency:     Status of Service:  Completed, signed off  If discussed at [employer] Length of Stay Meetings, dates discussed:    Additional Comments:  Collie Siadngela Chaska Hagger, RN 07/05/2016, 9:22 AM

## 2016-07-05 NOTE — Progress Notes (Signed)
*  PRELIMINARY RESULTS* Echocardiogram 2D Echocardiogram has been performed.  Cristela BlueHege, [place] Condon 07/05/2016, 3:08 PM

## 2016-07-06 ENCOUNTER — Inpatient Hospital Stay: Payer: Medicare Other

## 2016-07-06 DIAGNOSIS — R079 Chest pain, unspecified: Secondary | ICD-10-CM | POA: Diagnosis not present

## 2016-07-06 DIAGNOSIS — I2111 ST elevation (STEMI) myocardial infarction involving right coronary artery: Secondary | ICD-10-CM | POA: Diagnosis not present

## 2016-07-06 DIAGNOSIS — I2119 ST elevation (STEMI) myocardial infarction involving other coronary artery of inferior wall: Secondary | ICD-10-CM | POA: Diagnosis not present

## 2016-07-06 LAB — BASIC METABOLIC PANEL
Anion gap: 6 (ref 5–15)
BUN: 14 mg/dL (ref 6–20)
CALCIUM: 8.6 mg/dL — AB (ref 8.9–10.3)
CO2: 26 mmol/L (ref 22–32)
CREATININE: 0.6 mg/dL — AB (ref 0.61–1.24)
Chloride: 103 mmol/L (ref 101–111)
GFR calc Af Amer: >60 mL/min (ref >60)
Glucose, Bld: 213 mg/dL — ABNORMAL HIGH (ref 65–99)
POTASSIUM: 3.8 mmol/L (ref 3.5–5.1)
SODIUM: 135 mmol/L (ref 135–145)

## 2016-07-06 LAB — CBC
HCT: 45.4 % (ref 40.0–52.0)
Hemoglobin: 15.5 g/dL (ref 13.0–18.0)
MCH: 28.2 pg (ref 26.0–34.0)
MCHC: 34.1 g/dL (ref 32.0–36.0)
MCV: 82.7 fL (ref 80.0–100.0)
PLATELETS: 268 10*3/uL (ref 150–440)
RBC: 5.49 MIL/uL (ref 4.40–5.90)
RDW: 13.8 % (ref 11.5–14.5)
WBC: 10.2 10*3/uL (ref 3.8–10.6)

## 2016-07-06 LAB — GLUCOSE, CAPILLARY: GLUCOSE-CAPILLARY: 216 mg/dL — AB (ref 65–99)

## 2016-07-06 LAB — HEMOGLOBIN A1C
Hgb A1c MFr Bld: 11.3 % — ABNORMAL HIGH (ref 4.8–5.6)
Mean Plasma Glucose: 278 mg/dL

## 2016-07-06 LAB — HIV ANTIBODY (ROUTINE TESTING W REFLEX): HIV Screen 4th Generation wRfx: NONREACTIVE

## 2016-07-06 LAB — ECHOCARDIOGRAM COMPLETE
Height: 71 in
Weight: 4158.76 oz

## 2016-07-06 MED ORDER — ASPIRIN 81 MG PO TBEC
81.0000 mg | DELAYED_RELEASE_TABLET | Freq: Every day | ORAL | 12 refills | Status: DC
Start: 2016-07-06 — End: 2022-10-14

## 2016-07-06 MED ORDER — CLOPIDOGREL BISULFATE 75 MG PO TABS: 75.0000 mg | ORAL_TABLET | Freq: Every day | ORAL | 2 refills | Status: DC

## 2016-07-06 MED ORDER — INSULIN DETEMIR 100 UNIT/ML ~~LOC~~ SOLN: 20.0000 [IU] | Freq: Every day | SUBCUTANEOUS | 11 refills | Status: DC

## 2016-07-06 MED ORDER — INSULIN ASPART 100 UNIT/ML ~~LOC~~ SOLN: 5.0000 [IU] | Freq: Three times a day (TID) | SUBCUTANEOUS | 11 refills | Status: DC

## 2016-07-06 MED ORDER — METFORMIN HCL 1000 MG PO TABS: 1000.0000 mg | ORAL_TABLET | Freq: Two times a day (BID) | ORAL | 2 refills | Status: DC

## 2016-07-06 MED ORDER — LISINOPRIL 10 MG PO TABS: 10.0000 mg | ORAL_TABLET | Freq: Every day | ORAL | 2 refills | Status: DC

## 2016-07-06 MED ORDER — ATORVASTATIN CALCIUM 80 MG PO TABS: 80.0000 mg | ORAL_TABLET | Freq: Every day | ORAL | 2 refills | Status: DC

## 2016-07-06 MED ORDER — METOPROLOL TARTRATE 25 MG PO TABS: 25.0000 mg | ORAL_TABLET | Freq: Two times a day (BID) | ORAL | 2 refills | Status: DC

## 2016-07-06 NOTE — Progress Notes (Signed)
Progress Note  Patient Name: Jesse HarperMichael L Berhane Date of Encounter: 07/06/2016  Primary Cardiologist:   Dr. Kirke CorinArida  Subjective   No chest pain.  No SOB.   Inpatient Medications    Scheduled Meds: . aspirin  81 mg Oral Daily  . atorvastatin  80 mg Oral q1800  . clopidogrel  75 mg Oral Q breakfast  . docusate sodium  100 mg Oral BID  . enoxaparin (LOVENOX) injection  40 mg Subcutaneous Q24H  . insulin aspart  0-15 Units Subcutaneous TID WC  . insulin aspart  0-5 Units Subcutaneous QHS  . insulin aspart  4 Units Subcutaneous TID WC  . insulin glargine  16 Units Subcutaneous Daily  . lisinopril  5 mg Oral Daily  . metoprolol tartrate  25 mg Oral BID  . sodium chloride flush  3 mL Intravenous Q12H   Continuous Infusions: . sodium chloride     PRN Meds: sodium chloride, acetaminophen **OR** acetaminophen, meclizine, ondansetron **OR** ondansetron (ZOFRAN) IV, sodium chloride flush   Vital Signs    Vitals:   07/05/16 1100 07/05/16 1200 07/05/16 1400 07/05/16 1928  BP: (!) 146/92 (!) 136/97 (!) 151/81 129/78  Pulse: 70 71  75  Resp: (!) 7 17 18 16   Temp:  97.9 F (36.6 C) 98.1 F (36.7 C) 98.3 F (36.8 C)  TempSrc:  Oral  Oral  SpO2: 97% 96% 99% 98%  Weight:      Height:        Intake/Output Summary (Last 24 hours) at 07/06/16 0847 Last data filed at 07/05/16 1800  Gross per 24 hour  Intake          1029.12 ml  Output              750 ml  Net           279.12 ml   Filed Weights   07/05/16 0210 07/05/16 0435  Weight: 260 lb (117.9 kg) 259 lb 14.8 oz (117.9 kg)    Telemetry     - Personally Reviewed  ECG    NA - Personally Reviewed  Physical Exam   GEN: No acute distress.   Neck: No  JVD Cardiac: RRR, no murmurs, rubs, or gallops.  Respiratory: Clear  to auscultation bilaterally. GI: Soft, nontender, non-distended  MS: No  edema; No deformity. Neuro:  Nonfocal  Psych: Normal affect   Labs    Chemistry Recent Labs Lab 07/05/16 0209  07/06/16 0411  NA 134* 135  K 3.9 3.8  CL 99* 103  CO2 26 26  GLUCOSE 291* 213*  BUN 14 14  CREATININE 1.10 0.60*  CALCIUM 8.9 8.6*  GFRNONAA >60 >60  GFRAA >60 >60  ANIONGAP 9 6     Hematology Recent Labs Lab 07/05/16 0209 07/06/16 0411  WBC 12.6* 10.2  RBC 5.87 5.49  HGB 16.1 15.5  HCT 48.6 45.4  MCV 82.7 82.7  MCH 27.5 28.2  MCHC 33.2 34.1  RDW 13.8 13.8  PLT 345 268    Cardiac Enzymes Recent Labs Lab 07/05/16 0209 07/05/16 0428 07/05/16 1024 07/05/16 1616  TROPONINI <0.03 <0.03 <0.03 0.03*   No results for input(s): TROPIPOC in the last 168 hours.   BNPNo results for input(s): BNP, PROBNP in the last 168 hours.   DDimer No results for input(s): DDIMER in the last 168 hours.   Radiology    Dg Chest 1 View  Result Date: 07/05/2016 CLINICAL DATA:  Heart failure EXAM: CHEST 1 VIEW COMPARISON:  04/29/2006 FINDINGS: Cardiac shadow it is stable at the upper limits of normal. The lungs are well aerated bilaterally. No focal infiltrate or sizable effusion is seen. No bony abnormality is noted. IMPRESSION: No acute abnormality seen. Electronically Signed   By: Alcide Clever M.D.   On: 07/05/2016 07:15    Cardiac Studies   Cardiac cath:   Mid RCA to Dist RCA lesion, 100 %stenosed.  RPDA lesion, 90 %stenosed.  Dist Cx lesion, 50 %stenosed.  4th Mrg lesion, 75 %stenosed.  Mid LAD to Dist LAD lesion, 20 %stenosed.  A STENT XIENCE ALPINE RX [id number] drug eluting stent was successfully placed, and overlaps previously placed stent.  A STENT XIENCE ALPINE RX [id number] stent was successfully placed.  Dist RCA lesion, 100 %stenosed.  Post intervention, there is a 0% residual stenosis.  There is mild left ventricular systolic dysfunction.  LV end diastolic pressure is normal.  The left ventricular ejection fraction is 45-50% by visual estimate.   Patient Profile     46 y.o. male who is being seen for the evaluation of  Inferior ST elevation MI s/p  emergent PCI with overlapping DES x 2 in the RCA by Dr. Juliann Pares, MD  Assessment & Plan    ACUTE INFERIOR MI:  Continue DAPT.  Recommend cardiac rehab.    DYSLIPIDEMIA:  Continue Lipitor 80 mg daily.  HTN:  BP OK.  Continue current therapy.    DM:  A1C.  Needs better control per Leanna Sato, MD   ISCHEMIC CARDIOMYOPATHY:  EF is 45%.  Echo report pending.      Signed, Rollene Rotunda, MD  07/06/2016, 8:47 AM

## 2016-07-06 NOTE — Plan of Care (Signed)
Problem: Cardiovascular: Goal: Vascular access site(s) Level 0-1 will be maintained Outcome: Adequate for Discharge Pt right groin site has remained hematoma free, pulses present, dressing is clean, dry and intact.

## 2016-07-06 NOTE — Care Management Note (Addendum)
Case Management Note  Patient Details  Name: Shannan HarperMichael L Furey MRN: [id number] Date of Birth: 05/30/1970  Subjective/Objective:      No current home health orders. Mr Tonie GriffithBatten and his wife were provided with an appointment date and time at the [facility] on 07/16/16 at 2:10pm by the weekday case manager.               Action/Plan:   Expected Discharge Date:  07/06/16               Expected Discharge Plan:     In-House Referral:  PCP / [facility]  Discharge planning Services  CM Consult  Post Acute Care Choice:    Choice offered to:  Spouse  DME Arranged:    DME Agency:     HH Arranged:    HH Agency:     Status of Service:  Completed, signed off  If discussed at [employer] Length of Stay Meetings, dates discussed:    Additional Comments:  Charisma Charlot A, RN 07/06/2016, 10:19 AM

## 2016-07-06 NOTE — H&P (Signed)
Jesse [name] is an 46 y.o. male.    Late Entry (date and timed for time of physical exam) Chief Complaint: Chest pain HPI: The patient with past medical history of diabetes and coronary artery disease status post myocardial infarction and stent placement to the RCA presents to the emergency department complaining of chest pain. The patient states that he just eating supper when his chest began to hurt over his left breast. The pain radiated into his shoulder. He denies nausea, vomiting or diaphoresis. The patient states that his chest has been hurting intermittently for weeks. The pain is usually with exertion and resolves when he sits down. He has become very diaphoretic at other times. Tonight however he was more concerned because his pain lasted longer than usual. In the emergency department EKG was suspicious for ST elevation which prompted the patient to be taken to the Cath Lab where he was found to have another blockage requiring PCI. Following successful stent placement the interventional cardiologist called the hospitalist service for admission.  Past Medical History:  Diagnosis Date  . Coronary artery disease    Inferior myocardial infarction in 2004.  He was treated with thrombolytics at [facility].  He then had an inferior MI in March 2007.  He received a Horizon study stent 3.5 x 20 mm in the distal RCA at [facility].  No other obstructive disease.  . Diabetes mellitus    TYPE II  . Erectile dysfunction   . Hyperlipidemia   . Hypertension   . MI (myocardial infarction) ([place]) 4076,8088  . Morbid obesity ([place])   . Sleep apnea     Past Surgical History:  Procedure Laterality Date  . CARDIAC CATHETERIZATION     @ [place]  . LEFT HEART CATH AND CORONARY ANGIOGRAPHY N/A 07/05/2016   Procedure: Left Heart Cath and Coronary Angiography;  Surgeon: Yolonda Kida, MD;  Location: [facility];  Service: Cardiovascular;  Laterality: N/A;  . SEPTOPLASTY      Family History  Problem  Relation Age of Onset  . Adopted: Yes   Social History:  reports that he has quit smoking. His smoking use included Cigarettes. He has a 30.00 pack-year smoking history. He has never used smokeless tobacco. He reports that he does not drink alcohol or use drugs.  Allergies: No Known Allergies  Medications Prior to Admission  Medication Sig Dispense Refill  . aspirin 81 MG EC tablet Take 81 mg by mouth daily.      Marland Kitchen atorvastatin (LIPITOR) 40 MG tablet Take 40 mg by mouth daily.      . benazepril (LOTENSIN) 10 MG tablet Take 10 mg by mouth daily.    . carvedilol (COREG) 25 MG tablet Take 25 mg by mouth 2 (two) times daily with a meal.      . clopidogrel (PLAVIX) 75 MG tablet Take 75 mg by mouth daily.      Marland Kitchen ibuprofen (ADVIL,MOTRIN) 800 MG tablet Take 800 mg by mouth every 8 (eight) hours as needed.    . insulin aspart (NOVOLOG) 100 UNIT/ML injection Inject 5 Units into the skin 3 (three) times daily with meals.     . insulin detemir (LEVEMIR) 100 UNIT/ML injection Inject 20 Units into the skin at bedtime.     . metFORMIN (GLUCOPHAGE) 1000 MG tablet Take 1,000 mg by mouth 2 (two) times daily with a meal.    . meclizine (ANTIVERT) 32 MG tablet Take 1 tablet (32 mg total) by mouth 3 (three) times  daily as needed. (Patient not taking: Reported on 07/05/2016) 30 tablet 0    Results for orders placed or performed during the hospital encounter of 07/05/16 (from the past 48 hour(s))  CBC     Status: Abnormal   Collection Time: 07/05/16  2:09 AM  Result Value Ref Range   WBC 12.6 (H) 3.8 - 10.6 K/uL   RBC 5.87 4.40 - 5.90 MIL/uL   Hemoglobin 16.1 13.0 - 18.0 g/dL   HCT 48.6 40.0 - 52.0 %   MCV 82.7 80.0 - 100.0 fL   MCH 27.5 26.0 - 34.0 pg   MCHC 33.2 32.0 - 36.0 g/dL   RDW 13.8 11.5 - 14.5 %   Platelets 345 150 - 440 K/uL  Basic metabolic panel     Status: Abnormal   Collection Time: 07/05/16  2:09 AM  Result Value Ref Range   Sodium 134 (L) 135 - 145 mmol/L   Potassium 3.9 3.5 - 5.1  mmol/L   Chloride 99 (L) 101 - 111 mmol/L   CO2 26 22 - 32 mmol/L   Glucose, Bld 291 (H) 65 - 99 mg/dL   BUN 14 6 - 20 mg/dL   Creatinine, Ser 1.10 0.61 - 1.24 mg/dL   Calcium 8.9 8.9 - 10.3 mg/dL   GFR calc non Af Amer >60 >60 mL/min   GFR calc Af Amer >60 >60 mL/min    Comment: (NOTE) The eGFR has been calculated using the CKD EPI equation. This calculation has not been validated in all clinical situations. eGFR's persistently <60 mL/min signify possible Chronic Kidney Disease.    Anion gap 9 5 - 15  Troponin I     Status: None   Collection Time: 07/05/16  2:09 AM  Result Value Ref Range   Troponin I <0.03 <0.03 ng/mL  Protime-INR     Status: None   Collection Time: 07/05/16  2:09 AM  Result Value Ref Range   Prothrombin Time 14.2 11.4 - 15.2 seconds   INR 1.10   APTT     Status: Abnormal   Collection Time: 07/05/16  2:09 AM  Result Value Ref Range   aPTT 124 (H) 24 - 36 seconds    Comment:        IF BASELINE aPTT IS ELEVATED, SUGGEST PATIENT RISK ASSESSMENT BE USED TO DETERMINE APPROPRIATE ANTICOAGULANT THERAPY.   Glucose, capillary     Status: Abnormal   Collection Time: 07/05/16  2:46 AM  Result Value Ref Range   Glucose-Capillary 318 (H) 65 - 99 mg/dL  POCT Activated clotting time     Status: None   Collection Time: 07/05/16  3:27 AM  Result Value Ref Range   Activated Clotting Time 356 seconds  Glucose, capillary     Status: Abnormal   Collection Time: 07/05/16  4:17 AM  Result Value Ref Range   Glucose-Capillary 258 (H) 65 - 99 mg/dL  Troponin I (q 6hr x 3)     Status: None   Collection Time: 07/05/16  4:28 AM  Result Value Ref Range   Troponin I <0.03 <0.03 ng/mL  MRSA PCR Screening     Status: None   Collection Time: 07/05/16  5:28 AM  Result Value Ref Range   MRSA by PCR NEGATIVE NEGATIVE    Comment:        The GeneXpert MRSA Assay (FDA approved for NASAL specimens only), is one component of a comprehensive MRSA colonization surveillance program.  It is not intended to diagnose MRSA infection nor  to guide or monitor treatment for MRSA infections.   TSH     Status: None   Collection Time: 07/05/16  5:30 AM  Result Value Ref Range   TSH 1.828 0.350 - 4.500 uIU/mL    Comment: Performed by a 3rd Generation assay with a functional sensitivity of <=0.01 uIU/mL.  Hemoglobin A1c     Status: Abnormal   Collection Time: 07/05/16  5:30 AM  Result Value Ref Range   Hgb A1c MFr Bld 11.3 (H) 4.8 - 5.6 %    Comment: (NOTE)         Pre-diabetes: 5.7 - 6.4         Diabetes: >6.4         Glycemic control for adults with diabetes: <7.0    Mean Plasma Glucose 278 mg/dL    Comment: (NOTE) Performed At: [facility] [place], [place] [id number] Lindon Romp MD NW:[id number]   Glucose, capillary     Status: Abnormal   Collection Time: 07/05/16  7:40 AM  Result Value Ref Range   Glucose-Capillary 281 (H) 65 - 99 mg/dL  Troponin I (q 6hr x 3)     Status: None   Collection Time: 07/05/16 10:24 AM  Result Value Ref Range   Troponin I <0.03 <0.03 ng/mL  Lipid panel     Status: Abnormal   Collection Time: 07/05/16 10:24 AM  Result Value Ref Range   Cholesterol 191 0 - 200 mg/dL   Triglycerides 485 (H) <150 mg/dL   HDL 24 (L) >40 mg/dL   Total CHOL/HDL Ratio 8.0 RATIO   VLDL UNABLE TO CALCULATE IF TRIGLYCERIDE OVER 400 mg/dL 0 - 40 mg/dL   LDL Cholesterol UNABLE TO CALCULATE IF TRIGLYCERIDE OVER 400 mg/dL 0 - 99 mg/dL    Comment:        Total Cholesterol/HDL:CHD Risk Coronary Heart Disease Risk Table                     Men   Women  1/2 Average Risk   3.4   3.3  Average Risk       5.0   4.4  2 X Average Risk   9.6   7.1  3 X Average Risk  23.4   11.0        Use the calculated Patient Ratio above and the CHD Risk Table to determine the patient's CHD Risk.        ATP III CLASSIFICATION (LDL):  <100     mg/dL   Optimal  100-129  mg/dL   Near or Above                    Optimal  130-159  mg/dL   Borderline   160-189  mg/dL   High  >190     mg/dL   Very High   Urine Drug Screen, Qualitative ([facility] only)     Status: None   Collection Time: 07/05/16 11:06 AM  Result Value Ref Range   Tricyclic, Ur Screen NONE DETECTED NONE DETECTED   Amphetamines, Ur Screen NONE DETECTED NONE DETECTED   MDMA (Ecstasy)Ur Screen NONE DETECTED NONE DETECTED   Cocaine Metabolite,Ur [place] NONE DETECTED NONE DETECTED   Opiate, Ur Screen NONE DETECTED NONE DETECTED   Phencyclidine (PCP) Ur S NONE DETECTED NONE DETECTED   Cannabinoid 50 Ng, Ur [place] NONE DETECTED NONE DETECTED   Barbiturates, Ur Screen NONE DETECTED NONE DETECTED   Benzodiazepine, Ur Scrn NONE DETECTED  NONE DETECTED   Methadone Scn, Ur NONE DETECTED NONE DETECTED    Comment: (NOTE) 381  Tricyclics, urine               Cutoff 1000 ng/mL 200  Amphetamines, urine             Cutoff 1000 ng/mL 300  MDMA (Ecstasy), urine           Cutoff 500 ng/mL 400  Cocaine Metabolite, urine       Cutoff 300 ng/mL 500  Opiate, urine                   Cutoff 300 ng/mL 600  Phencyclidine (PCP), urine      Cutoff 25 ng/mL 700  Cannabinoid, urine              Cutoff 50 ng/mL 800  Barbiturates, urine             Cutoff 200 ng/mL 900  Benzodiazepine, urine           Cutoff 200 ng/mL 1000 Methadone, urine                Cutoff 300 ng/mL 1100 1200 The urine drug screen provides only a preliminary, unconfirmed 1300 analytical test result and should not be used for non-medical 1400 purposes. Clinical consideration and professional judgment should 1500 be applied to any positive drug screen result due to possible 1600 interfering substances. A more specific alternate chemical method 1700 must be used in order to obtain a confirmed analytical result.  1800 Gas chromato graphy / mass spectrometry (GC/MS) is the preferred 1900 confirmatory method.   Glucose, capillary     Status: Abnormal   Collection Time: 07/05/16 12:16 PM  Result Value Ref Range   Glucose-Capillary 241 (H) 65  - 99 mg/dL  Troponin I (q 6hr x 3)     Status: Abnormal   Collection Time: 07/05/16  4:16 PM  Result Value Ref Range   Troponin I 0.03 (HH) <0.03 ng/mL    Comment: CRITICAL RESULT CALLED TO, READ BACK BY AND VERIFIED WITH ASHLEY WILLIAMS 07/05/16 @ 60  MLK   Glucose, capillary     Status: Abnormal   Collection Time: 07/05/16  5:19 PM  Result Value Ref Range   Glucose-Capillary 176 (H) 65 - 99 mg/dL  Glucose, capillary     Status: Abnormal   Collection Time: 07/05/16  9:05 PM  Result Value Ref Range   Glucose-Capillary 249 (H) 65 - 99 mg/dL   Dg Chest 1 View  Result Date: 07/05/2016 CLINICAL DATA:  Heart failure EXAM: CHEST 1 VIEW COMPARISON:  04/29/2006 FINDINGS: Cardiac shadow it is stable at the upper limits of normal. The lungs are well aerated bilaterally. No focal infiltrate or sizable effusion is seen. No bony abnormality is noted. IMPRESSION: No acute abnormality seen. Electronically Signed   By: Inez Catalina M.D.   On: 07/05/2016 07:15    Review of Systems  Constitutional: Negative for chills and fever.  HENT: Negative for sore throat and tinnitus.   Eyes: Negative for blurred vision and redness.  Respiratory: Positive for shortness of breath. Negative for cough.   Cardiovascular: Positive for chest pain (resolved). Negative for palpitations, orthopnea and PND.  Gastrointestinal: Negative for abdominal pain, diarrhea, nausea and vomiting.  Genitourinary: Negative for dysuria, frequency and urgency.  Musculoskeletal: Negative for joint pain and myalgias.  Skin: Negative for rash.       No lesions  Neurological: Negative for  speech change, focal weakness and weakness.  Endo/Heme/Allergies: Does not bruise/bleed easily.       No temperature intolerance  Psychiatric/Behavioral: Negative for depression and suicidal ideas.    Blood pressure 129/78, pulse 75, temperature 98.3 F (36.8 C), temperature source Oral, resp. rate 16, height 5' 11"  (1.803 m), weight 117.9 kg (259  lb 14.8 oz), SpO2 98 %. Physical Exam  Nursing note and vitals reviewed. Constitutional: He is oriented to person, place, and time. He appears well-developed and well-nourished. No distress.  HENT:  Head: Normocephalic and atraumatic.  Mouth/Throat: Oropharynx is clear and moist.  Eyes: Conjunctivae and EOM are normal. Pupils are equal, round, and reactive to light. No scleral icterus.  Neck: Normal range of motion. Neck supple. No JVD present. No tracheal deviation present. No thyromegaly present.  Cardiovascular: Normal rate, regular rhythm and normal heart sounds.  Exam reveals no gallop and no friction rub.   No murmur heard. Respiratory: Effort normal and breath sounds normal. No respiratory distress.  GI: Soft. Bowel sounds are normal. He exhibits no distension. There is no tenderness.  Genitourinary:  Genitourinary Comments: Deferred  Musculoskeletal: Normal range of motion. He exhibits no edema.  Lymphadenopathy:    He has no cervical adenopathy.  Neurological: He is alert and oriented to person, place, and time. No cranial nerve deficit.  Skin: Skin is warm and dry. No rash noted. No erythema.  Psychiatric: He has a normal mood and affect. His behavior is normal. Judgment and thought content normal.     Assessment/Plan This is a 45 year old male admitted for STEMI. 1. STEMI: Status post PCI with overlapping stent to RCA. Patient tolerated the procedure well. He is chest pain-free. Telemetry is normal and we will monitor for reperfusion arrhythmia as well as any catheterization complications. 2. Coronary artery disease: stabilized; continue aspirin and Plavix. Complete Angiomax. 3. Hypertension: Controlled; continue metoprolol and lisinopril. 4. Diabetes mellitus type 2: Hold metformin; sliding scale insulin while hospitalized 5. Hyperlipidemia: Continue statin therapy 6. DVT prophylaxis: Lovenox to start tomorrow neck slight 7. GI prophylaxis: None The patient is a full code.  Time spent on admission orders and critical care approximately 45 minutes  Harrie Foreman, MD 07/06/2016, 2:05 AM

## 2016-07-08 ENCOUNTER — Telehealth: Payer: Self-pay | Admitting: Cardiovascular Disease

## 2016-07-08 NOTE — Telephone Encounter (Signed)
-----   Message from Sondra Bargesyan M Dunn, PA-C sent at 07/08/2016 10:46 AM EDT ----- Please see below. Thanks! ----- Message ----- From: Rollene RotundaHochrein, James, MD Sent: 07/06/2016   9:14 AM To: [place] OuchMuhammad A Arida, MD, Sondra Bargesyan M Dunn, PA-C  Needs followup with you.

## 2016-07-08 NOTE — Telephone Encounter (Signed)
Attempted to call patient  Line was busy Needs a follow up  Will try again at a later time

## 2016-07-09 ENCOUNTER — Encounter: Payer: Self-pay | Admitting: Cardiovascular Disease

## 2016-07-09 NOTE — Telephone Encounter (Signed)
Number is incorrect  Will send letter to patient.

## 2016-07-10 NOTE — Consult Note (Signed)
Reason for Consult: STEMI inferior wall Referring Physician: Emergency room and hospitalist Dr. Sheryle Hail Cardiology Dr. Kirke Corin Late entry consult STEMI note  Jesse [name] is an 46 y.o. male.  HPI: 46 year old white male known coronary disease previous PCI and stent in the past myocardial infarction 2004 and again in 2007 only one stent distal RCA at [facility] presented with history of diabetes hyperlipidemia hypertension obesity sleep apnea and smoking who complains of acute onset of chest discomfort on and off recently but his pain really began about 30-60 minutes prior to presentation patient states that he was using supper when the chest pain began to hurt all over his left chest area the patient also noticed radiation to the left shoulder he went to bed but symptoms did not resolve he also became diaphoretic thought initially was reflux. Pain did not resolve and stayed relatively persistent but 7 out of 10 and he found his called rescue and came in EMS thought he may be having a STEMI and presented emergency room which was confirmed he had waxing and waning EKGs but subsequently cardiology was called for emergent assessment and evaluation. Patient presented within about an hour of the onset of chest discomfort. Patient had quit taking all his medications several months ago for his heart is unclear why has not followed up with cardiology in 2-3 years.  Past Medical History:  Diagnosis Date  . Coronary artery disease    Inferior myocardial infarction in 2004.  He was treated with thrombolytics at [facility].  He then had an inferior MI in March 2007.  He received a Horizon study stent 3.5 x 20 mm in the distal RCA at [facility].  No other obstructive disease.  . Diabetes mellitus    TYPE II  . Erectile dysfunction   . Hyperlipidemia   . Hypertension   . MI (myocardial infarction) (HCC) 6295,2841  . Morbid obesity (HCC)   . Sleep apnea     Past Surgical History:  Procedure Laterality Date  .  CARDIAC CATHETERIZATION     @ [facility]  . LEFT HEART CATH AND CORONARY ANGIOGRAPHY N/A 07/05/2016   Procedure: Left Heart Cath and Coronary Angiography;  Surgeon: Alwyn Pea, MD;  Location: [facility] INVASIVE CV LAB;  Service: Cardiovascular;  Laterality: N/A;  . SEPTOPLASTY      Family History  Problem Relation Age of Onset  . Adopted: Yes    Social History:  reports that he has quit smoking. His smoking use included Cigarettes. He has a 30.00 pack-year smoking history. He has never used smokeless tobacco. He reports that he does not drink alcohol or use drugs.  Allergies: No Known Allergies  Medications: I have reviewed the patient's current medications.  No results found for this or any previous visit (from the past 48 hour(s)).  No results found.  Review of Systems  Constitutional: Positive for diaphoresis and malaise/fatigue.  HENT: Positive for congestion.   Eyes: Negative.   Respiratory: Positive for shortness of breath.   Cardiovascular: Positive for chest pain, palpitations, orthopnea and PND.  Gastrointestinal: Negative.   Genitourinary: Negative.   Musculoskeletal: Negative.   Skin: Negative.   Neurological: Positive for weakness.  Endo/Heme/Allergies: Negative.   Psychiatric/Behavioral: Negative.    Blood pressure 129/78, pulse 75, temperature 98.3 F (36.8 C), temperature source Oral, resp. rate 16, height 5\' 11"  (1.803 m), weight 117.9 kg (259 lb 14.8 oz), SpO2 98 %. Physical Exam  Nursing note and vitals reviewed. Constitutional: He is oriented to person,  place, and time. He appears well-developed and well-nourished.  HENT:  Head: Normocephalic and atraumatic.  Eyes: Conjunctivae and EOM are normal. Pupils are equal, round, and reactive to light.  Neck: Normal range of motion. Neck supple.  Cardiovascular: Normal rate, regular rhythm and normal heart sounds.   Respiratory: Effort normal and breath sounds normal.  GI: Soft. Bowel sounds are normal.   Musculoskeletal: Normal range of motion.  Neurological: He is alert and oriented to person, place, and time. He has normal reflexes.  Skin: Skin is warm and dry.  Psychiatric: He has a normal mood and affect.    Assessment/Plan: STEMI inferior wall Coronary artery disease Diabetes Hypertension Hyperlipidemia Smoking Obesity Noncompliance Obstructive sleep apnea.  . Plan Agree with aspirin full dose Would load with Plavix 600 mg Agree with heparin therapy in emergency room about [phone] Follow-up EKGs and troponins Consider echocardiogram for further assessment Recommend emergent cardiac catheter with intention to treat with intervention if feasible Reinstitute lipid management with Lipitor Crestor Continue diabetes management and control Advised patient to refrain from tobacco abuse Recommend sleep study CPAP weight loss After catheter and possible intervention will transfer the patient back to [facility] cardiology.  Jesse [name] 07/10/2016, 8:52 AM

## 2016-07-10 NOTE — Discharge Summary (Signed)
[facility] [place] [facility]   PATIENT NAME: Jesse [name]    MR#:  [id number]  DATE OF BIRTH:  February 13, 1970  DATE OF ADMISSION:  07/05/2016   ADMITTING PHYSICIAN: Alwyn Pea, MD  DATE OF DISCHARGE: 07/06/2016 11:01 AM  PRIMARY CARE PHYSICIAN: Leanna Sato, MD   ADMISSION DIAGNOSIS:   ST elevation myocardial infarction (STEMI), unspecified artery (HCC) [I21.3] STEMI involving right coronary artery (HCC) [I21.11]  DISCHARGE DIAGNOSIS:   Principal Problem:   STEMI involving right coronary artery (HCC) Active Problems:   Obese   Essential hypertension   CAD, NATIVE VESSEL   Ischemic cardiomyopathy   Polysubstance abuse   H/O noncompliance with medical treatment, presenting hazards to health   Poorly controlled diabetes mellitus (HCC)   SECONDARY DIAGNOSIS:   Past Medical History:  Diagnosis Date  . Coronary artery disease    Inferior myocardial infarction in 2004.  He was treated with thrombolytics at [facility].  He then had an inferior MI in March 2007.  He received a Horizon study stent 3.5 x 20 mm in the distal RCA at [facility].  No other obstructive disease.  . Diabetes mellitus    TYPE II  . Erectile dysfunction   . Hyperlipidemia   . Hypertension   . MI (myocardial infarction) (HCC) 4098,1191  . Morbid obesity (HCC)   . Sleep apnea     HOSPITAL COURSE:   46 year old male with past medical history significant for history of CAD with prior stents, diabetes, hypertension and sleep apnea presents to hospital secondary to chest pain.  #1 acute inferior wall MI-presenting as STEMI, requiring emergent overlapping DES x 2 in RCA  - Appreciate cardiology consult. Patient started on aspirin and Plavix. -Also cardiac medications like metoprolol, lisinopril and statin added. Smoking cessation advised. -Resolution of symptoms after placing stents. -Cardiac rehabilitation referral given  #2 diabetes mellitus-on metformin, Levemir and  NovoLog. Follow up with PCP  #3 hypertension-on metoprolol and lisinopril  #4 hyperlipidemia-dose increased, on statin.  Patient being discharged home  DISCHARGE CONDITIONS:   Guarded  CONSULTS OBTAINED:   Treatment Team:  Antonieta Iba, MD  DRUG ALLERGIES:   No Known Allergies DISCHARGE MEDICATIONS:   Allergies as of 07/06/2016   No Known Allergies     Medication List    STOP taking these medications   benazepril 10 MG tablet Commonly known as:  LOTENSIN   carvedilol 25 MG tablet Commonly known as:  COREG   ibuprofen 800 MG tablet Commonly known as:  ADVIL,MOTRIN     TAKE these medications   aspirin 81 MG EC tablet Take 1 tablet (81 mg total) by mouth daily.   atorvastatin 80 MG tablet Commonly known as:  LIPITOR Take 1 tablet (80 mg total) by mouth daily. What changed:  medication strength  how much to take   clopidogrel 75 MG tablet Commonly known as:  PLAVIX Take 1 tablet (75 mg total) by mouth daily.   insulin aspart 100 UNIT/ML injection Commonly known as:  novoLOG Inject 5 Units into the skin 3 (three) times daily with meals.   insulin detemir 100 UNIT/ML injection Commonly known as:  LEVEMIR Inject 0.2 mLs (20 Units total) into the skin at bedtime.   lisinopril 10 MG tablet Commonly known as:  PRINIVIL,ZESTRIL Take 1 tablet (10 mg total) by mouth daily.   meclizine 32 MG tablet Commonly known as:  ANTIVERT Take 1 tablet (32 mg total) by mouth 3 (three) times daily as  needed.   metFORMIN 1000 MG tablet Commonly known as:  GLUCOPHAGE Take 1 tablet (1,000 mg total) by mouth 2 (two) times daily with a meal.   metoprolol tartrate 25 MG tablet Commonly known as:  LOPRESSOR Take 1 tablet (25 mg total) by mouth 2 (two) times daily.        DISCHARGE INSTRUCTIONS:   1. Cardiology follow up in 1-2 weeks 2. PCP follow-up in 2 weeks  DIET:   Cardiac diet  ACTIVITY:   Activity as tolerated  OXYGEN:   Home Oxygen: No.    Oxygen Delivery: room air  DISCHARGE LOCATION:   home   If you experience worsening of your admission symptoms, develop shortness of breath, life threatening emergency, suicidal or homicidal thoughts you must seek medical attention immediately by calling 911 or calling your MD immediately  if symptoms less severe.  You Must read complete instructions/literature along with all the possible adverse reactions/side effects for all the Medicines you take and that have been prescribed to you. Take any new Medicines after you have completely understood and accpet all the possible adverse reactions/side effects.   Please note  You were cared for by a hospitalist during your hospital stay. If you have any questions about your discharge medications or the care you received while you were in the hospital after you are discharged, you can call the unit and asked to speak with the hospitalist on call if the hospitalist that took care of you is not available. Once you are discharged, your primary care physician will handle any further medical issues. Please note that NO REFILLS for any discharge medications will be authorized once you are discharged, as it is imperative that you return to your primary care physician (or establish a relationship with a primary care physician if you do not have one) for your aftercare needs so that they can reassess your need for medications and monitor your lab values.    On the day of Discharge:  VITAL SIGNS:   Blood pressure 129/78, pulse 75, temperature 98.3 F (36.8 C), temperature source Oral, resp. rate 16, height 5\' 11"  (1.803 m), weight 117.9 kg (259 lb 14.8 oz), SpO2 98 %.  PHYSICAL EXAMINATION:    GENERAL:  46 y.o.-year-old patient lying in the bed with no acute distress.  EYES: Pupils equal, round, reactive to light and accommodation. No scleral icterus. Extraocular muscles intact.  HEENT: Head atraumatic, normocephalic. Oropharynx and nasopharynx clear.   NECK:  Supple, no jugular venous distention. No thyroid enlargement, no tenderness.  LUNGS: Normal breath sounds bilaterally, no wheezing, rales,rhonchi or crepitation. No use of accessory muscles of respiration.  CARDIOVASCULAR: S1, S2 normal. No murmurs, rubs, or gallops.  ABDOMEN: Soft, non-tender, non-distended. Bowel sounds present. No organomegaly or mass.  EXTREMITIES: No pedal edema, cyanosis, or clubbing.  NEUROLOGIC: Cranial nerves II through XII are intact. Muscle strength 5/5 in all extremities. Sensation intact. Gait not checked.  PSYCHIATRIC: The patient is alert and oriented x 3.  SKIN: No obvious rash, lesion, or ulcer.   DATA REVIEW:   CBC  Recent Labs Lab 07/06/16 0411  WBC 10.2  HGB 15.5  HCT 45.4  PLT 268    Chemistries   Recent Labs Lab 07/06/16 0411  NA 135  K 3.8  CL 103  CO2 26  GLUCOSE 213*  BUN 14  CREATININE 0.60*  CALCIUM 8.6*     Microbiology Results  Results for orders placed or performed during the hospital encounter of 07/05/16  MRSA PCR Screening     Status: None   Collection Time: 07/05/16  5:28 AM  Result Value Ref Range Status   MRSA by PCR NEGATIVE NEGATIVE Final    Comment:        The GeneXpert MRSA Assay (FDA approved for NASAL specimens only), is one component of a comprehensive MRSA colonization surveillance program. It is not intended to diagnose MRSA infection nor to guide or monitor treatment for MRSA infections.     RADIOLOGY:  No results found.   Management plans discussed with the patient, family and they are in agreement.  CODE STATUS:  Code Status History    Date Active Date Inactive Code Status Order ID Comments User Context   07/05/2016  5:04 AM 07/06/2016  2:06 PM Full Code [id number]  Arnaldo Natal, MD Inpatient   07/05/2016  4:31 AM 07/05/2016  5:04 AM Full Code [id number]  Alwyn Pea, MD Inpatient      TOTAL TIME TAKING CARE OF THIS PATIENT: 38 minutes.    Kerrianne Jeng M.D on  07/10/2016 at 10:33 AM  Between 7am to 6pm - Pager - [phone]  After 6pm go to [email or web address] - Social research officer, government  [facility] [place] [facility]  Office  [phone]  CC: Primary care physician; Leanna Sato, MD   Note: This dictation was prepared with Dragon dictation along with smaller phrase technology. Any transcriptional errors that result from this process are unintentional.

## 2016-08-13 IMAGING — CT CT HEAD W/O CM
1 series · 16 of 30 positions shown, 20 images · non-contrast
Comparison: None.

CLINICAL DATA: Weakness, dizziness for 3 hours.

EXAM:
CT HEAD WITHOUT CONTRAST
TECHNIQUE: Contiguous axial images were obtained from the base of the skull
through the vertex without intravenous contrast.

[Series 2: head wo · axial · 0.42mm/px · z∈[+460,+590]mm · 16 of 30 slices shown, 20 images]
[im 2/30  brain]
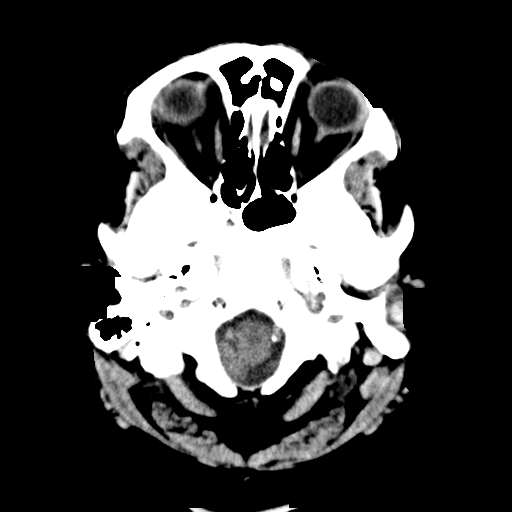
[im 2/30  bone]
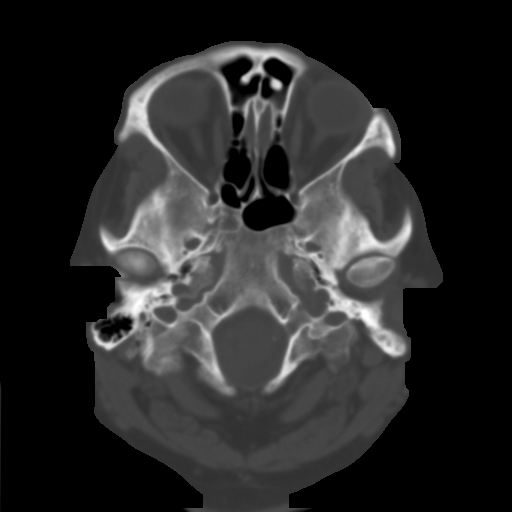
[im 4/30  brain]
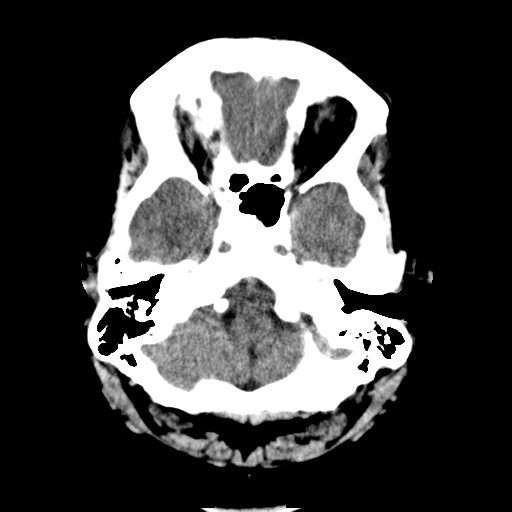
[im 6/30  brain]
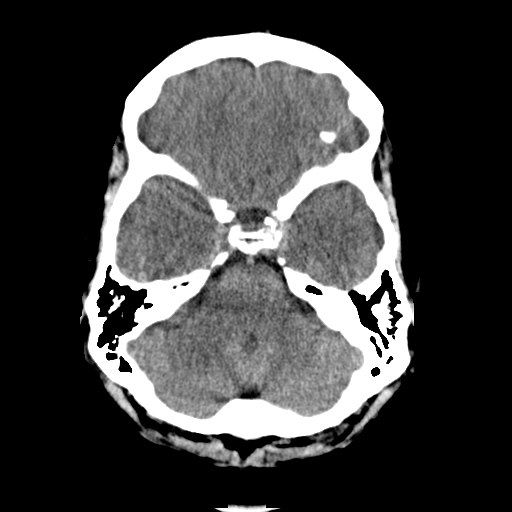
[im 8/30  brain]
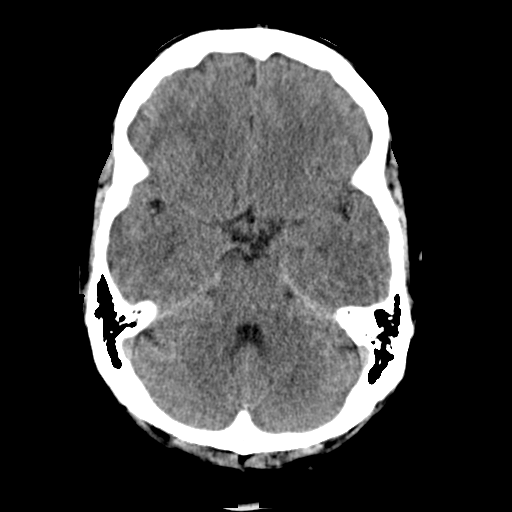
[im 9/30  brain]
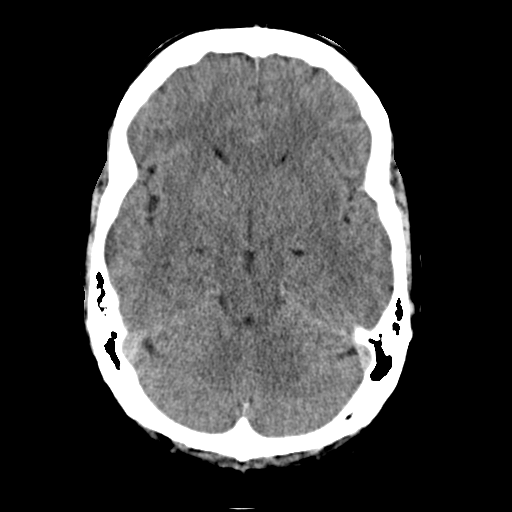
[im 9/30  bone]
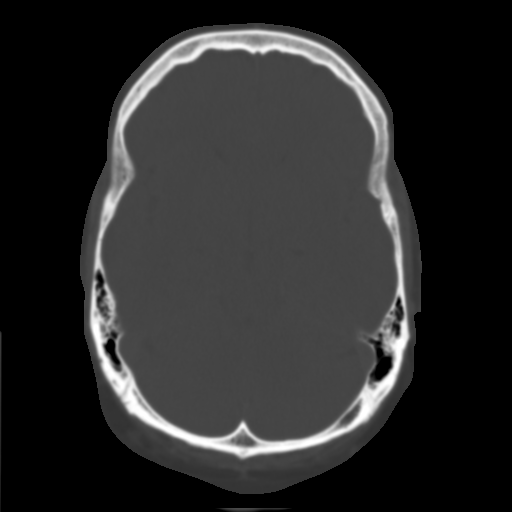
[im 11/30  brain]
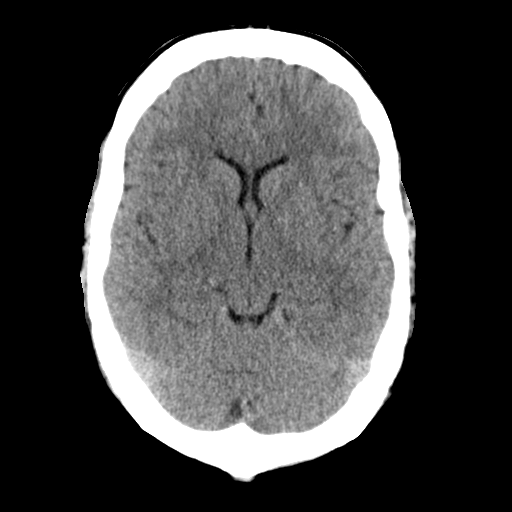
[im 13/30  brain]
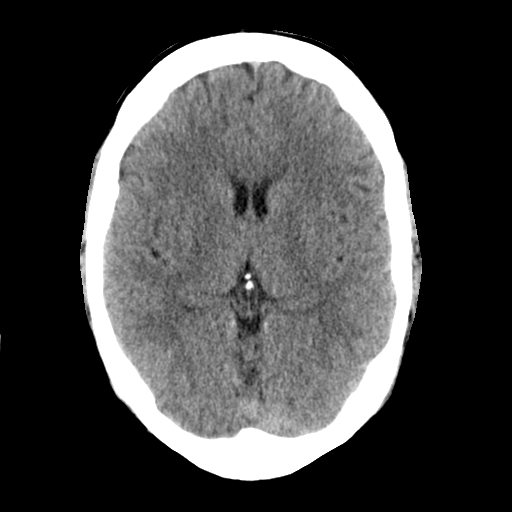
[im 15/30  brain]
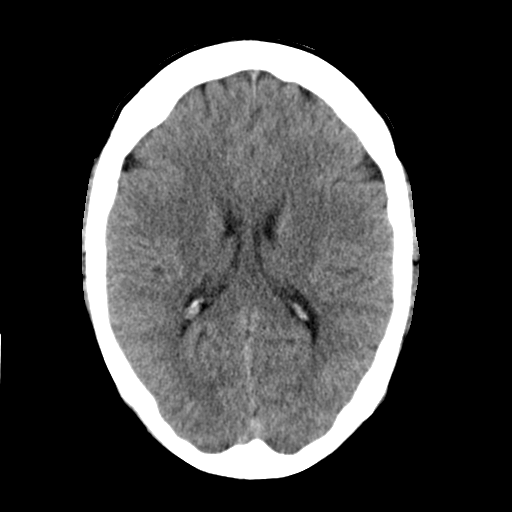
[im 16/30  brain]
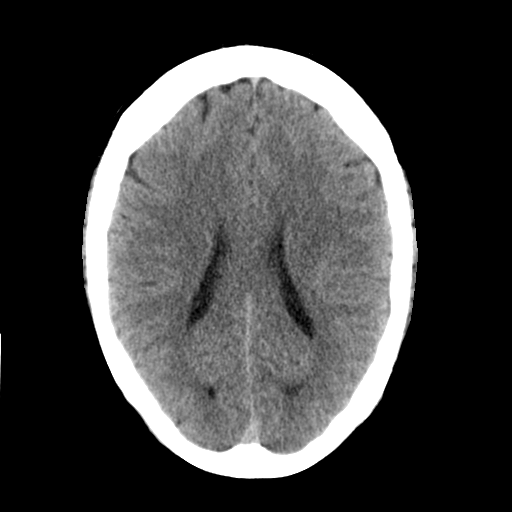
[im 16/30  bone]
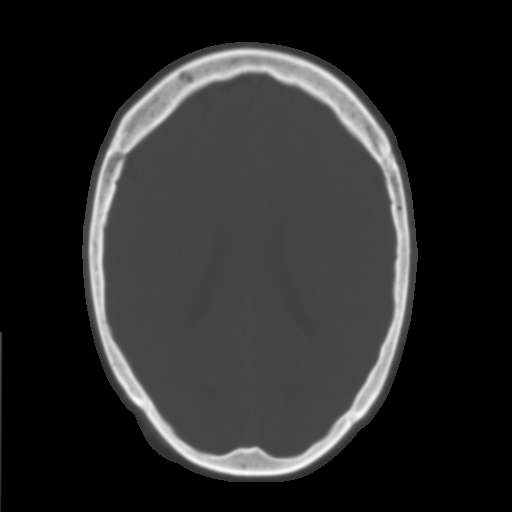
[im 18/30  brain]
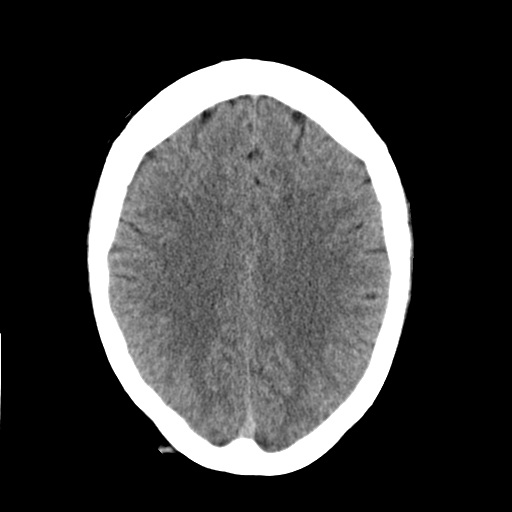
[im 20/30  brain]
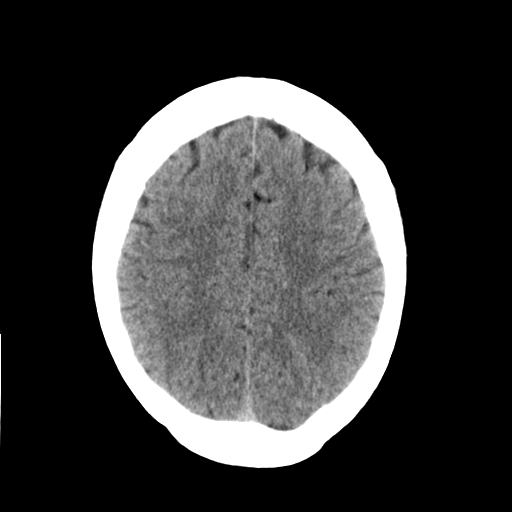
[im 22/30  brain]
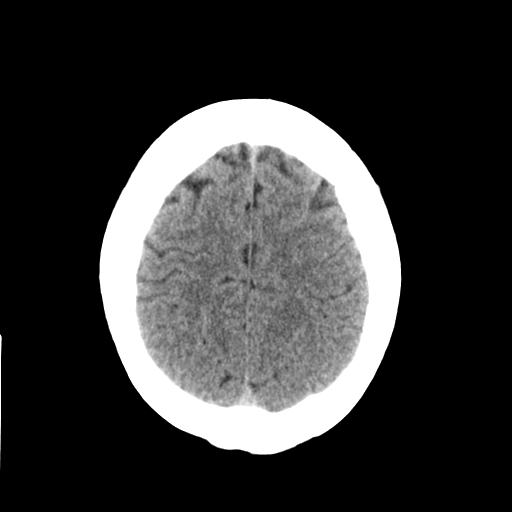
[im 23/30  brain]
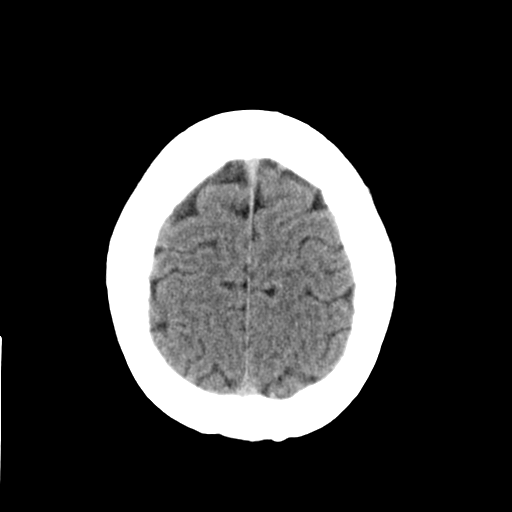
[im 23/30  bone]
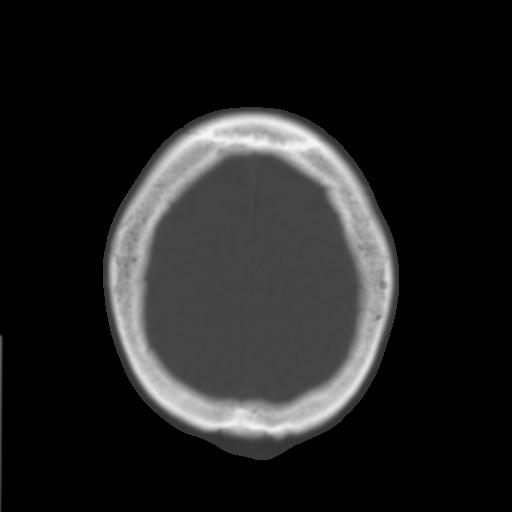
[im 25/30  brain]
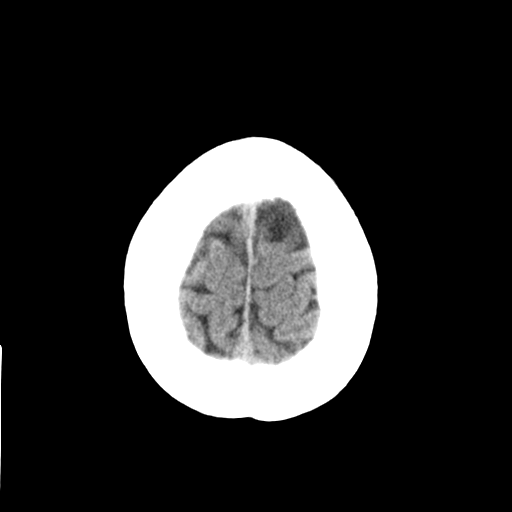
[im 27/30  brain]
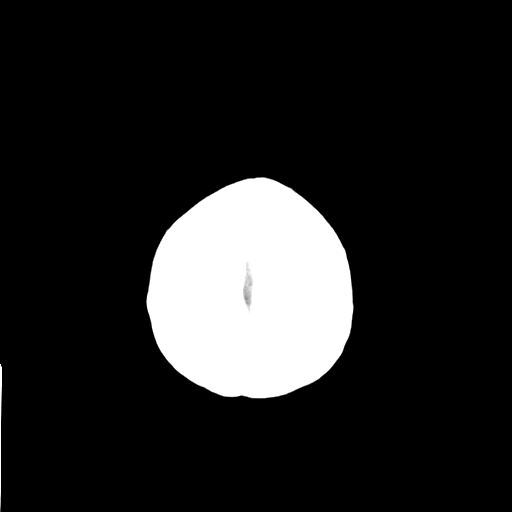
[im 29/30  brain]
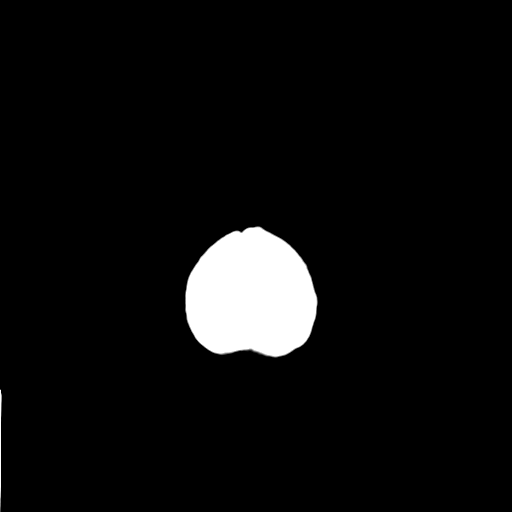

[16 of 30 positions shown; findings below may reference images not displayed]

FINDINGS: No acute intracranial abnormality. Specifically, no hemorrhage,
hydrocephalus, mass lesion, acute infarction, or significant
intracranial injury. No acute calvarial abnormality. Visualized
paranasal sinuses and mastoids clear. Orbital soft tissues
unremarkable.
IMPRESSION: Negative.

## 2016-12-01 ENCOUNTER — Emergency Department: Payer: Medicare Other

## 2016-12-01 ENCOUNTER — Emergency Department
Admission: EM | Admit: 2016-12-01 | Discharge: 2016-12-01 | Disposition: A | Discharge status: Home / Self Care | Payer: Medicare Other | Attending: Emergency Medicine | Admitting: Emergency Medicine

## 2016-12-01 ENCOUNTER — Other Ambulatory Visit: Payer: Self-pay

## 2016-12-01 DIAGNOSIS — R0789 Other chest pain: Secondary | ICD-10-CM

## 2016-12-01 DIAGNOSIS — I252 Old myocardial infarction: Secondary | ICD-10-CM | POA: Insufficient documentation

## 2016-12-01 DIAGNOSIS — R079 Chest pain, unspecified: Secondary | ICD-10-CM | POA: Diagnosis present

## 2016-12-01 DIAGNOSIS — Z79899 Other long term (current) drug therapy: Secondary | ICD-10-CM | POA: Diagnosis not present

## 2016-12-01 DIAGNOSIS — Z7902 Long term (current) use of antithrombotics/antiplatelets: Secondary | ICD-10-CM | POA: Diagnosis not present

## 2016-12-01 DIAGNOSIS — Z794 Long term (current) use of insulin: Secondary | ICD-10-CM | POA: Insufficient documentation

## 2016-12-01 DIAGNOSIS — Z87891 Personal history of nicotine dependence: Secondary | ICD-10-CM | POA: Insufficient documentation

## 2016-12-01 DIAGNOSIS — Z7982 Long term (current) use of aspirin: Secondary | ICD-10-CM | POA: Insufficient documentation

## 2016-12-01 DIAGNOSIS — I259 Chronic ischemic heart disease, unspecified: Secondary | ICD-10-CM | POA: Insufficient documentation

## 2016-12-01 DIAGNOSIS — Z955 Presence of coronary angioplasty implant and graft: Secondary | ICD-10-CM | POA: Insufficient documentation

## 2016-12-01 DIAGNOSIS — E119 Type 2 diabetes mellitus without complications: Secondary | ICD-10-CM | POA: Insufficient documentation

## 2016-12-01 LAB — TROPONIN I
TROPONIN I: 0.08 ng/mL — AB (ref <0.03)
Troponin I: 0.03 ng/mL (ref <0.03)

## 2016-12-01 LAB — CBC
HEMATOCRIT: 51.3 % (ref 40.0–52.0)
HEMOGLOBIN: 17.3 g/dL (ref 13.0–18.0)
MCH: 28.3 pg (ref 26.0–34.0)
MCHC: 33.7 g/dL (ref 32.0–36.0)
MCV: 83.8 fL (ref 80.0–100.0)
Platelets: 329 10*3/uL (ref 150–440)
RBC: 6.12 MIL/uL — AB (ref 4.40–5.90)
RDW: 13.5 % (ref 11.5–14.5)
WBC: 9.3 10*3/uL (ref 3.8–10.6)

## 2016-12-01 LAB — BASIC METABOLIC PANEL
ANION GAP: 12 (ref 5–15)
BUN: 9 mg/dL (ref 6–20)
CHLORIDE: 96 mmol/L — AB (ref 101–111)
CO2: 24 mmol/L (ref 22–32)
Calcium: 8.9 mg/dL (ref 8.9–10.3)
Creatinine, Ser: 0.55 mg/dL — ABNORMAL LOW (ref 0.61–1.24)
Glucose, Bld: 256 mg/dL — ABNORMAL HIGH (ref 65–99)
POTASSIUM: 3.9 mmol/L (ref 3.5–5.1)
SODIUM: 132 mmol/L — AB (ref 135–145)

## 2016-12-01 NOTE — ED Triage Notes (Signed)
Pt states that he had a mi in June, pt states that since Thursday he has been having rt sided chest pain that radiates into the right side of his neck.

## 2016-12-01 NOTE — ED Notes (Signed)
Pt states that he has had CP to R side for the past few days.  Pt states that he was off his blood pressure meds for a few days waiting for them to be refilled and recently started back on them.  Pt states that he thought this would make the CP go away.  Pt is A&Ox4, in NAD.  Pt states that when he had his MI in June that he had similar symptoms for the same amount of time.  Pt states he had 2 stents placed after MI in June.

## 2016-12-01 NOTE — ED Provider Notes (Signed)
[facility] Emergency Department Provider Note ____________________________________________   First MD Initiated Contact with Patient 12/01/16 1655     (approximate)  I have reviewed the triage vital signs and the nursing notes.   HISTORY  Chief Complaint Chest Pain    HPI Jesse [name] is a 45 y.o. male with a history of CAD status post MI 6 months ago who presents with chest pain, mainly substernal and right-sided, radiating to the neck into the back, initially intermittent but now more constant over the last day.  Patient states that the pain started several days ago.  He describes it as a tightness or burning, but not pressure or sharp pain.  He denies associated shortness of breath, lightheadedness, nausea or vomiting, or leg swelling.  No cough or fever.  He states that it felt somewhat similar when he had the MI six months ago, but that was much more severe.   Past Medical History:  Diagnosis Date  . Coronary artery disease    Inferior myocardial infarction in 2004.  He was treated with thrombolytics at [facility].  He then had an inferior MI in March 2007.  He received a Horizon study stent 3.5 x 20 mm in the distal RCA at [facility].  No other obstructive disease.  . Diabetes mellitus    TYPE II  . Erectile dysfunction   . Hyperlipidemia   . Hypertension   . MI (myocardial infarction) (HCC) 1610,9604  . Morbid obesity (HCC)   . Sleep apnea     Patient Active Problem List   Diagnosis Date Noted  . STEMI involving right coronary artery (HCC) 07/05/2016  . Ischemic cardiomyopathy 07/05/2016  . Polysubstance abuse (HCC) 07/05/2016  . H/O noncompliance with medical treatment, presenting hazards to health 07/05/2016  . Poorly controlled diabetes mellitus (HCC) 07/05/2016  . DYSPNEA 09/28/2008  . HYPERLIPIDEMIA-MIXED 08/13/2008  . Obese 08/13/2008  . Essential hypertension 08/13/2008  . CAD, NATIVE VESSEL 08/13/2008    Past Surgical History:    Procedure Laterality Date  . CARDIAC CATHETERIZATION     @ [facility]  . SEPTOPLASTY      Prior to Admission medications   Medication Sig Start Date End Date Taking? Authorizing Provider  aspirin 81 MG EC tablet Take 1 tablet (81 mg total) by mouth daily. 07/06/16   Enid Baas, MD  atorvastatin (LIPITOR) 80 MG tablet Take 1 tablet (80 mg total) by mouth daily. 07/06/16   Enid Baas, MD  clopidogrel (PLAVIX) 75 MG tablet Take 1 tablet (75 mg total) by mouth daily. 07/06/16   Enid Baas, MD  insulin aspart (NOVOLOG) 100 UNIT/ML injection Inject 5 Units into the skin 3 (three) times daily with meals. 07/06/16   Enid Baas, MD  insulin detemir (LEVEMIR) 100 UNIT/ML injection Inject 0.2 mLs (20 Units total) into the skin at bedtime. 07/06/16   Enid Baas, MD  lisinopril (PRINIVIL,ZESTRIL) 10 MG tablet Take 1 tablet (10 mg total) by mouth daily. 07/07/16   Enid Baas, MD  meclizine (ANTIVERT) 32 MG tablet Take 1 tablet (32 mg total) by mouth 3 (three) times daily as needed. Patient not taking: Reported on 07/05/2016 10/11/14   Jene Every, MD  metFORMIN (GLUCOPHAGE) 1000 MG tablet Take 1 tablet (1,000 mg total) by mouth 2 (two) times daily with a meal. 07/06/16   Enid Baas, MD  metoprolol tartrate (LOPRESSOR) 25 MG tablet Take 1 tablet (25 mg total) by mouth 2 (two) times daily. 07/06/16   Enid Baas, MD  Allergies Patient has no known allergies.  Family History  Adopted: Yes    Social History Social History   Tobacco Use  . Smoking status: Former Smoker    Packs/day: 2.00    Years: 15.00    Pack years: 30.00    Types: Cigarettes  . Smokeless tobacco: Never Used  Substance Use Topics  . Alcohol use: No  . Drug use: No    Comment: prior hx of cocaine and marijuana    Review of Systems  Constitutional: No fever/chills Eyes: No visual changes. ENT: No sore throat. Cardiovascular: Positive for chest pain. Respiratory:  Denies shortness of breath. Gastrointestinal: No nausea, no vomiting.  No diarrhea.  Genitourinary: Negative for dysuria.  Musculoskeletal: Negative for back pain. Skin: Negative for rash. Neurological: Negative for headaches, focal weakness or numbness.   ____________________________________________   PHYSICAL EXAM:  VITAL SIGNS: ED Triage Vitals  Enc Vitals Group     BP 12/01/16 1624 (!) 153/84     Pulse Rate 12/01/16 1624 92     Resp 12/01/16 1624 18     Temp 12/01/16 1624 (!) 97.5 F (36.4 C)     Temp Source 12/01/16 1624 Oral     SpO2 12/01/16 1624 100 %     Weight 12/01/16 1625 260 lb (117.9 kg)     Height 12/01/16 1625 5\' 11"  (1.803 m)     Head Circumference --      Peak Flow --      Pain Score 12/01/16 1624 4     Pain Loc --      Pain Edu? --      Excl. in GC? --     Constitutional: Alert and oriented. Well appearing and in no acute distress. Eyes: Conjunctivae are normal.  Head: Atraumatic. Nose: No congestion/rhinnorhea. Mouth/Throat: Mucous membranes are moist.   Neck: Normal range of motion.  Cardiovascular: Normal rate, regular rhythm. Grossly normal heart sounds.  Good peripheral circulation. Respiratory: Normal respiratory effort.  No retractions. Lungs CTAB. Gastrointestinal: Soft and nontender. No distention.  Genitourinary: No CVA tenderness. Musculoskeletal: No lower extremity edema.  Extremities warm and well perfused.  No calf swelling or tenderness.  Neurologic:  Normal speech and language. No gross focal neurologic deficits are appreciated.  Skin:  Skin is warm and dry. No rash noted. Psychiatric: Mood and affect are normal. Speech and behavior are normal.  ____________________________________________   LABS (all labs ordered are listed, but only abnormal results are displayed)  Labs Reviewed  BASIC METABOLIC PANEL - Abnormal; Notable for the following components:      Result Value   Sodium 132 (*)    Chloride 96 (*)    Glucose, Bld 256  (*)    Creatinine, Ser 0.55 (*)    All other components within normal limits  CBC - Abnormal; Notable for the following components:   RBC 6.12 (*)    All other components within normal limits  TROPONIN I - Abnormal; Notable for the following components:   Troponin I 0.03 (*)    All other components within normal limits  TROPONIN I - Abnormal; Notable for the following components:   Troponin I 0.08 (*)    All other components within normal limits   ____________________________________________  EKG  ED ECG REPORT I, Dionne BucySebastian Jacquese Cassarino, the attending physician, personally viewed and interpreted this ECG.  Date: 12/01/2016 EKG Time: 1624 Rate: 93 Rhythm: normal sinus rhythm QRS Axis: normal Intervals: normal ST/T Wave abnormalities: T wave inversion III Narrative Interpretation:  no evidence of acute ischemia; no significant change when compared with EKG of 07/05/2016  ED ECG REPORT I, Dionne BucySebastian Dariona Postma, the attending physician, personally viewed and interpreted this ECG.  Date: 12/01/2016 EKG Time: 1810 Rate: 78 Rhythm: normal sinus rhythm QRS Axis: normal Intervals: normal ST/T Wave abnormalities: T wave inversion III Narrative Interpretation: no evidence of acute ischemia; no dynamic changes when compared to EKG earlier today  ED ECG REPORT I, Dionne BucySebastian Londynn Sonoda, the attending physician, personally viewed and interpreted this ECG.  Date: 12/01/2016 EKG Time: 1948 Rate: 85 Rhythm: normal sinus rhythm QRS Axis: normal Intervals: normal ST/T Wave abnormalities: T wave inversion III Narrative Interpretation: no evidence of acute ischemia; no dynamic changes when compared to both EKGs earlier today   ____________________________________________  RADIOLOGY  CXR: no focal infiltrate or other acute abnormalities  ____________________________________________   PROCEDURES  Procedure(s) performed: No    Critical Care performed:  No ____________________________________________   INITIAL IMPRESSION / ASSESSMENT AND PLAN / ED COURSE  Pertinent labs & imaging results that were available during my care of the patient were reviewed by me and considered in my medical decision making (see chart for details).  46 year old male with history of CAD status post MI presents with somewhat atypical chest pain over the last several days, which is nonexertional, not associated with shortness of breath or other concerning symptoms.  Review of past medical records in Epic confirms history of CAD, and ST elevation MI with stenting in June of this year.  On exam, vital signs are normal, patient is well-appearing, and the exam is otherwise unremarkable.  Patient overall is moderately high risk for ACS, so will obtain cardiac enzymes x2 and observe in the ED for several hours.  However this acute presentation is somewhat atypical, and symptoms are improving.  If patient continues to improve and workup is negative, anticipate likely discharge home.  There is no clinical evidence for DVT or PE.  Susp most likely benign cause such as msk pain, GERD or other nonspecific etiology.     ----------------------------------------- 8:43 PM on 12/01/2016 -----------------------------------------  Initial troponin minimally elevated.  Repeat troponin is slightly more elevated, but still within indeterminate range.  Patient has been pain-free in the emergency department since he arrived.  Patient continues to feel well.  His vital signs are stable.  There are no dynamic EKG changes.  I consulted cardiologist on call Dr. Darrold JunkerParaschos and discussed the case and findings with him.  He recommends that given minimal troponin elevation and resolved symptoms as well as no dynamic EKG changes, patient can be discharged and follow-up closely with his cardiologist Dr. Nydia Boutonalwood.  I discussed this with the patient who agrees with this plan, and would like to go home.   I instructed him to call Dr. Glennis Brinkallwood's office first thing tomorrow morning to arrange follow-up with the next 1-2 days.  I gave return precautions and instructed him to return to the ED immediately for new or worsening symptoms.  Patient expressed understanding. ____________________________________________   FINAL CLINICAL IMPRESSION(S) / ED DIAGNOSES  Final diagnoses:  Atypical chest pain      NEW MEDICATIONS STARTED DURING THIS VISIT:  This SmartLink is deprecated. Use AVSMEDLIST instead to display the medication list for a patient.   Note:  This document was prepared using Dragon voice recognition software and may include unintentional dictation errors.    Dionne BucySiadecki, Roselin Wiemann, MD 12/01/16 2045

## 2016-12-01 NOTE — Discharge Instructions (Signed)
Call Dr. Glennis Brinkallwood's office tomorrow morning to arrange a follow-up with the next few days.  Return to the emergency department immediately for new or worsening chest pain, difficulty breathing, lightheadedness or weakness, palpitations, fevers, or any other new or worsening symptoms that concern you.  Continue to take your Plavix, aspirin, and other medications as prescribed.

## 2016-12-01 NOTE — ED Notes (Signed)
Date and time results received: 12/01/16 1715  Test: Troponin Critical Value: 0.03  Name of Provider Notified: Dr. Marisa SeverinSiadecki  Orders Received? Or Actions Taken?: acknowledged

## 2016-12-01 NOTE — ED Notes (Signed)
Pt discharged to home.  Family member driving.  Discharge instructions reviewed.  Verbalized understanding.  No questions or concerns at this time.  Teach back verified.  Pt in NAD.  No items left in ED.   

## 2017-02-27 ENCOUNTER — Other Ambulatory Visit: Payer: Self-pay

## 2017-02-27 ENCOUNTER — Emergency Department: Payer: Medicare Other

## 2017-02-27 ENCOUNTER — Encounter: Payer: Self-pay | Admitting: Emergency Medicine

## 2017-02-27 ENCOUNTER — Inpatient Hospital Stay
Admission: EM | Admit: 2017-02-27 | Discharge: 2017-02-28 | DRG: 303 | Disposition: A | Discharge status: Home / Self Care | Payer: Medicare Other | Attending: Internal Medicine | Admitting: Internal Medicine

## 2017-02-27 DIAGNOSIS — I248 Other forms of acute ischemic heart disease: Secondary | ICD-10-CM | POA: Diagnosis present

## 2017-02-27 DIAGNOSIS — E669 Obesity, unspecified: Secondary | ICD-10-CM | POA: Diagnosis not present

## 2017-02-27 DIAGNOSIS — Z7902 Long term (current) use of antithrombotics/antiplatelets: Secondary | ICD-10-CM | POA: Diagnosis not present

## 2017-02-27 DIAGNOSIS — Z9114 Patient's other noncompliance with medication regimen: Secondary | ICD-10-CM

## 2017-02-27 DIAGNOSIS — E119 Type 2 diabetes mellitus without complications: Secondary | ICD-10-CM | POA: Diagnosis not present

## 2017-02-27 DIAGNOSIS — F17290 Nicotine dependence, other tobacco product, uncomplicated: Secondary | ICD-10-CM | POA: Diagnosis not present

## 2017-02-27 DIAGNOSIS — Z7982 Long term (current) use of aspirin: Secondary | ICD-10-CM

## 2017-02-27 DIAGNOSIS — I214 Non-ST elevation (NSTEMI) myocardial infarction: Secondary | ICD-10-CM | POA: Diagnosis present

## 2017-02-27 DIAGNOSIS — Z955 Presence of coronary angioplasty implant and graft: Secondary | ICD-10-CM

## 2017-02-27 DIAGNOSIS — G4733 Obstructive sleep apnea (adult) (pediatric): Secondary | ICD-10-CM | POA: Diagnosis present

## 2017-02-27 DIAGNOSIS — E785 Hyperlipidemia, unspecified: Secondary | ICD-10-CM | POA: Diagnosis present

## 2017-02-27 DIAGNOSIS — Z7984 Long term (current) use of oral hypoglycemic drugs: Secondary | ICD-10-CM

## 2017-02-27 DIAGNOSIS — I25110 Atherosclerotic heart disease of native coronary artery with unstable angina pectoris: Principal | ICD-10-CM | POA: Diagnosis present

## 2017-02-27 DIAGNOSIS — I10 Essential (primary) hypertension: Secondary | ICD-10-CM | POA: Diagnosis present

## 2017-02-27 DIAGNOSIS — Z6835 Body mass index (BMI) 35.0-35.9, adult: Secondary | ICD-10-CM | POA: Diagnosis not present

## 2017-02-27 DIAGNOSIS — I252 Old myocardial infarction: Secondary | ICD-10-CM | POA: Diagnosis not present

## 2017-02-27 DIAGNOSIS — R079 Chest pain, unspecified: Secondary | ICD-10-CM

## 2017-02-27 LAB — HEPATIC FUNCTION PANEL
ALBUMIN: 3.9 g/dL (ref 3.5–5.0)
ALT: 22 U/L (ref 17–63)
AST: 25 U/L (ref 15–41)
Alkaline Phosphatase: 64 U/L (ref 38–126)
Bilirubin, Direct: <0.1 mg/dL — ABNORMAL LOW (ref 0.1–0.5)
Total Bilirubin: <0.1 mg/dL — ABNORMAL LOW (ref 0.3–1.2)
Total Protein: 7.5 g/dL (ref 6.5–8.1)

## 2017-02-27 LAB — CBC
HEMATOCRIT: 48.1 % (ref 40.0–52.0)
Hemoglobin: 15.9 g/dL (ref 13.0–18.0)
MCH: 27.7 pg (ref 26.0–34.0)
MCHC: 33.1 g/dL (ref 32.0–36.0)
MCV: 83.5 fL (ref 80.0–100.0)
Platelets: 361 10*3/uL (ref 150–440)
RBC: 5.76 MIL/uL (ref 4.40–5.90)
RDW: 13.5 % (ref 11.5–14.5)
WBC: 12.8 10*3/uL — ABNORMAL HIGH (ref 3.8–10.6)

## 2017-02-27 LAB — BASIC METABOLIC PANEL
Anion gap: 10 (ref 5–15)
BUN: 8 mg/dL (ref 6–20)
CHLORIDE: 102 mmol/L (ref 101–111)
CO2: 26 mmol/L (ref 22–32)
Calcium: 9.2 mg/dL (ref 8.9–10.3)
Creatinine, Ser: 0.68 mg/dL (ref 0.61–1.24)
GFR calc Af Amer: >60 mL/min (ref >60)
GFR calc non Af Amer: >60 mL/min (ref >60)
GLUCOSE: 278 mg/dL — AB (ref 65–99)
POTASSIUM: 4.2 mmol/L (ref 3.5–5.1)
Sodium: 138 mmol/L (ref 135–145)

## 2017-02-27 LAB — BRAIN NATRIURETIC PEPTIDE: B NATRIURETIC PEPTIDE 5: 71 pg/mL (ref 0.0–100.0)

## 2017-02-27 LAB — LIPASE, BLOOD: Lipase: 31 U/L (ref 11–51)

## 2017-02-27 LAB — TROPONIN I: Troponin I: 0.29 ng/mL (ref <0.03)

## 2017-02-27 MED ORDER — MORPHINE SULFATE (PF) 4 MG/ML IV SOLN: 4.0000 mg | Freq: Once | INTRAVENOUS | Status: DC

## 2017-02-27 MED ORDER — NITROGLYCERIN 0.4 MG SL SUBL: 0.4000 mg | SUBLINGUAL_TABLET | SUBLINGUAL | Status: DC | PRN

## 2017-02-27 MED ORDER — NITROGLYCERIN IN D5W 200-5 MCG/ML-% IV SOLN
0.0000 ug/min | Freq: Once | INTRAVENOUS | Status: AC
  Administered 2017-02-28: 5 ug/min via INTRAVENOUS
  Filled 2017-02-27: qty 250

## 2017-02-27 MED ORDER — HEPARIN SODIUM (PORCINE) 5000 UNIT/ML IJ SOLN
4000.0000 [IU] | Freq: Once | INTRAMUSCULAR | Status: AC
  Administered 2017-02-28: 4000 [IU] via INTRAVENOUS
  Filled 2017-02-27: qty 1

## 2017-02-27 MED ORDER — ONDANSETRON HCL 4 MG/2ML IJ SOLN: 4.0000 mg | INTRAMUSCULAR | Status: DC

## 2017-02-27 MED ORDER — ASPIRIN 81 MG PO CHEW
324.0000 mg | CHEWABLE_TABLET | Freq: Once | ORAL | Status: AC
  Administered 2017-02-28: 324 mg via ORAL
  Filled 2017-02-27: qty 4

## 2017-02-27 MED ORDER — SODIUM CHLORIDE 0.9 % IV BOLUS (SEPSIS)
1000.0000 mL | INTRAVENOUS | Status: AC
  Administered 2017-02-28: 1000 mL via INTRAVENOUS

## 2017-02-27 MED ORDER — GI COCKTAIL ~~LOC~~
30.0000 mL | Freq: Once | ORAL | Status: AC
  Administered 2017-02-27: 30 mL via ORAL
  Filled 2017-02-27: qty 30

## 2017-02-27 MED ORDER — NITROGLYCERIN 0.4 MG SL SUBL
0.4000 mg | SUBLINGUAL_TABLET | SUBLINGUAL | Status: DC | PRN
  Administered 2017-02-28: 0.4 mg via SUBLINGUAL
  Filled 2017-02-27: qty 1

## 2017-02-27 NOTE — ED Provider Notes (Signed)
[facility] Emergency Department Provider Note  ____________________________________________   First MD Initiated Contact with Patient 02/27/17 2258     (approximate)  I have reviewed the triage vital signs and the nursing notes.   HISTORY  Chief Complaint Chest Pain    HPI Jesse [name] is a 47 y.o. male with a history of CAD and MI s/p multiple drug-eluting stents who sees Dr. Juliann Paresallwood for cardiology.  He presents for evaluation of persistent chest pain for several hours tonight but which he has been feeling intermittently over the last week or so.  He saw Dr. Juliann Paresallwood yesterday in clinic and the patient reports that Dr. Juliann Paresallwood does not think that the pain he is experiencing is related to his heart.  He says that nothing in particular makes the pain better or worse but it happens at rest and with exertion.  This current episode happened tonight while he was watching TV and has been constant and feels like a sharp stabbing and pressure-like pain in the center of his chest.  When he arrived in triage she was diaphoretic and tachycardic and he appears better now but reports the pain is still present.  He denies shortness of breath, nausea, vomiting, abdominal pain, fever/chills, and headache.  He describes the pain as severe.   Past Medical History:  Diagnosis Date  . Coronary artery disease    Inferior myocardial infarction in 2004.  He [facility].  He then had an inferior MI in March 2007.  He received a Horizon study stent 3.5 x 20 mm in the distal RCA at [facility].  No other obstructive disease.  . Diabetes mellitus    TYPE II  . Erectile dysfunction   . Hyperlipidemia   . Hypertension   . MI (myocardial infarction) (HCC) [id number]  . Morbid obesity (HCC)   . Sleep apnea     Patient Active Problem List   Diagnosis Date Noted  . NSTEMI (non-ST elevated myocardial infarction) (HCC) 02/28/2017  . STEMI involving right  coronary artery (HCC) 07/05/2016  . Ischemic cardiomyopathy 07/05/2016  . Polysubstance abuse (HCC) 07/05/2016  . H/O noncompliance with medical treatment, presenting hazards to health 07/05/2016  . Poorly controlled diabetes mellitus (HCC) 07/05/2016  . DYSPNEA 09/28/2008  . HYPERLIPIDEMIA-MIXED 08/13/2008  . Obese 08/13/2008  . Essential hypertension 08/13/2008  . CAD, NATIVE VESSEL 08/13/2008    Past Surgical History:  Procedure Laterality Date  . CARDIAC CATHETERIZATION     @ [facility]  . LEFT HEART CATH AND CORONARY ANGIOGRAPHY N/A 07/05/2016   Procedure: Left Heart Cath and Coronary Angiography;  Surgeon: Alwyn Peaallwood, Dwayne D, MD;  Location: [facility] INVASIVE CV LAB;  Service: Cardiovascular;  Laterality: N/A;  . SEPTOPLASTY      Prior to Admission medications   Medication Sig Start Date End Date Taking? Authorizing Provider  aspirin 81 MG EC tablet Take 1 tablet (81 mg total) by mouth daily. 07/06/16  Yes Enid BaasKalisetti, Radhika, MD  atorvastatin (LIPITOR) 40 MG tablet Take 40 mg by mouth daily.   Yes [provider]  clopidogrel (PLAVIX) 75 MG tablet Take 1 tablet (75 mg total) by mouth daily. 07/06/16  Yes Enid BaasKalisetti, Radhika, MD  Dulaglutide (TRULICITY) 0.75 MG/0.5ML SOPN Inject 0.75 mg into the skin once a week.   Yes [provider]  lisinopril (PRINIVIL,ZESTRIL) 10 MG tablet Take 1 tablet (10 mg total) by mouth daily. 07/07/16  Yes Enid BaasKalisetti, Radhika, MD  metFORMIN (GLUCOPHAGE) 1000 MG tablet Take  1 tablet (1,000 mg total) by mouth 2 (two) times daily with a meal. 07/06/16  Yes Enid Baas, MD  metoprolol tartrate (LOPRESSOR) 25 MG tablet Take 1 tablet (25 mg total) by mouth 2 (two) times daily. 07/06/16  Yes Enid Baas, MD  TRADJENTA 5 MG TABS tablet Take 5 mg by mouth daily. 02/22/17  Yes [provider]  traMADol (ULTRAM) 50 MG tablet Take 50 mg by mouth every 6 (six) hours as needed for pain. 02/13/17  Yes [provider]  atorvastatin  (LIPITOR) 80 MG tablet Take 1 tablet (80 mg total) by mouth daily. Patient not taking: Reported on 02/28/2017 07/06/16   Enid Baas, MD  insulin aspart (NOVOLOG) 100 UNIT/ML injection Inject 5 Units into the skin 3 (three) times daily with meals. Patient not taking: Reported on 02/28/2017 07/06/16   Enid Baas, MD  insulin detemir (LEVEMIR) 100 UNIT/ML injection Inject 0.2 mLs (20 Units total) into the skin at bedtime. Patient not taking: Reported on 02/28/2017 07/06/16   Enid Baas, MD    Allergies Patient has no known allergies.  Family History  Adopted: Yes    Social History Social History   Tobacco Use  . Smoking status: Former Smoker    Packs/day: 2.00    Years: 15.00    Pack years: 30.00    Types: Cigarettes  . Smokeless tobacco: Never Used  Substance Use Topics  . Alcohol use: No  . Drug use: No    Comment: prior hx of cocaine and marijuana    Review of Systems Constitutional: No fever/chills Eyes: No visual changes. ENT: No sore throat. Cardiovascular: +chest pain. Respiratory: Denies shortness of breath. Gastrointestinal: No abdominal pain.  No nausea, no vomiting.  No diarrhea.  No constipation. Genitourinary: Negative for dysuria. Musculoskeletal: Negative for neck pain.  Negative for back pain. Integumentary: Negative for rash. Neurological: Negative for headaches, focal weakness or numbness.   ____________________________________________   PHYSICAL EXAM:  VITAL SIGNS: ED Triage Vitals  Enc Vitals Group     BP 02/27/17 2219 (!) 149/108     Pulse Rate 02/27/17 2219 (!) 115     Resp 02/27/17 2219 20     Temp 02/27/17 2219 98.6 F (37 C)     Temp Source 02/27/17 2219 Oral     SpO2 02/27/17 2219 98 %     Weight 02/27/17 2215 116.6 kg (257 lb)     Height 02/27/17 2215 1.803 m (5\' 11" )     Head Circumference --      Peak Flow --      Pain Score 02/27/17 2215 9     Pain Loc --      Pain Edu? --      Excl. in GC? --      Constitutional: Alert and oriented.  Appears uncomfortable but is not in acute distress Eyes: Conjunctivae are normal.  Head: Atraumatic. Nose: No congestion/rhinnorhea. Mouth/Throat: Mucous membranes are moist. Neck: No stridor.  No meningeal signs.   Cardiovascular: Tachycardia, regular rhythm. Good peripheral circulation. Grossly normal heart sounds. No reproducible chest wall pain Respiratory: Normal respiratory effort.  No retractions. Lungs CTAB. Gastrointestinal: Soft and nontender. No distention.  Musculoskeletal: No lower extremity tenderness nor edema. No gross deformities of extremities. Neurologic:  Normal speech and language. No gross focal neurologic deficits are appreciated.  Skin:  Skin is warm, dry and intact. No rash noted. Psychiatric: Mood and affect are normal. Speech and behavior are normal.  ____________________________________________   LABS (all labs ordered  are listed, but only abnormal results are displayed)  Labs Reviewed  BASIC METABOLIC PANEL - Abnormal; Notable for the following components:      Result Value   Glucose, Bld 278 (*)    All other components within normal limits  CBC - Abnormal; Notable for the following components:   WBC 12.8 (*)    All other components within normal limits  TROPONIN I - Abnormal; Notable for the following components:   Troponin I 0.29 (*)    All other components within normal limits  HEPATIC FUNCTION PANEL - Abnormal; Notable for the following components:   Total Bilirubin <0.1 (*)    Bilirubin, Direct <0.1 (*)    All other components within normal limits  GLUCOSE, CAPILLARY - Abnormal; Notable for the following components:   Glucose-Capillary 146 (*)    All other components within normal limits  LIPASE, BLOOD  BRAIN NATRIURETIC PEPTIDE  APTT  PROTIME-INR  HEPARIN LEVEL (UNFRACTIONATED)  CBC  TSH  TROPONIN I  TROPONIN I  TROPONIN I   ____________________________________________  EKG  ED ECG  REPORT I, Loleta Rose, the attending physician, personally viewed and interpreted this ECG.  Date: 02/27/2017 EKG Time: 22: 12 Rate: 117 Rhythm: sinus tachycardia QRS Axis: normal Intervals: normal ST/T Wave abnormalities: ST elevation in lead III with inverted T waves, similar but more pronounced than ECG from 3 months ago Narrative Interpretation: suspect acute ischemia, but does not qualify as STEMI  ED ECG REPORT #2 (after NTG) I, Loleta Rose, the attending physician, personally viewed and interpreted this ECG.  Date: 02/28/2017 EKG Time: 00:10 Rate: 116 Rhythm: sinus tachycardia QRS Axis: normal Intervals: normal ST/T Wave abnormalities: inverted T waves in leads III and aVF, but improved ST elevation in III compared to prior Narrative Interpretation: no evidence of acute ischemia at this time, improved from prior     ____________________________________________  RADIOLOGY I, Loleta Rose, personally viewed and evaluated these images (plain radiographs) as part of my medical decision making, as well as reviewing the written report by the radiologist.  ED MD interpretation:  No pneumonia  Official radiology report(s): Dg Chest Port 1 View  Result Date: 02/27/2017 CLINICAL DATA:  Chest pain EXAM: PORTABLE CHEST 1 VIEW COMPARISON:  12/01/2016 FINDINGS: The heart size and mediastinal contours are within normal limits. Both lungs are clear. The visualized skeletal structures are unremarkable. IMPRESSION: No active disease. Electronically Signed   By: Jasmine Pang M.D.   On: 02/27/2017 22:33    ____________________________________________   PROCEDURES  Critical Care performed: Yes, see critical care procedure note(s)   Procedure(s) performed:   .Critical Care Performed by: Loleta Rose, MD Authorized by: Loleta Rose, MD   Critical care provider statement:    Critical care time (minutes):  45   Critical care time was exclusive of:  Separately billable  procedures and treating other patients   Critical care was necessary to treat or prevent imminent or life-threatening deterioration of the following conditions:  Cardiac failure and circulatory failure (NSTEMI)   Critical care was time spent personally by me on the following activities:  Development of treatment plan with patient or surrogate, discussions with consultants, evaluation of patient's response to treatment, examination of patient, obtaining history from patient or surrogate, ordering and performing treatments and interventions, ordering and review of laboratory studies, ordering and review of radiographic studies, pulse oximetry, re-evaluation of patient's condition and review of old charts       ____________________________________________   INITIAL IMPRESSION / ASSESSMENT AND  PLAN / ED COURSE  As part of my medical decision making, I reviewed the following data within the electronic MEDICAL RECORD NUMBER Nursing notes reviewed and incorporated, Labs reviewed , EKG interpreted , Old chart reviewed, Radiograph reviewed , Discussed with admitting physician , A consult was requested and obtained from this/these consultant(s) Cardiology and Notes from prior ED visits    Differential diagnosis includes, but is not limited to, ACS, aortic dissection, pulmonary embolism, cardiac tamponade, pneumothorax, pneumonia, pericarditis, myocarditis, GI-related causes including esophagitis/gastritis, and musculoskeletal chest wall pain.    Presentation mostly concerning for ACS, although aortic dissection is also high on my differential.  The patient is tachycardic but this is likely secondary to pain and anxiety.  He has no risk factors for PE and the episodic nature of his pain is much more consistent with ACS.  His Wells score for PE is 1.5 which is reassuring.    Of note, his EKG shows increased ST elevation in lead III and some abnormality in aVF compared to his prior EKG from 3 months ago which  may indicate acute ischemia.  I will try a GI cocktail in case this is more of an esophageal issue, but I anticipate that he will require admission for ongoing chest pain but will await the results of his lab work to determine which way to go in terms of imaging versus ACS treatment.  Clinical Course as of Feb 28 553  Thu Feb 27, 2017  2349 Troponin is 0.29 with ongoing pain not relieved by GI cocktail. Paged Dr. Juliann Pares to discuss. Ordered ASA 324 mg PO and heparin bolus + infusion.  [CF]  Fri Feb 28, 2017  0006 Discussed case by phone with Dr. Juliann Pares.  He is coming to the ED to evaluate the patient in person.  He requested I give NTG 0.4 mg SL and start a NTG drip, in addition to the ASA and heparain I already ordered.  The additional orders have been placed and discussed with RNs  [CF]  0032 The patient's pain has improved on nitro drip.  He will not say it is completely gone but it sounds like it might have been completely relieved.  He states he feels much better than when he came in.  I repeated an EKG that shows less obvious ST segment elevation in lead III and I forwarded that EKG to Dr. Juliann Pares.  Waiting to hear back.  [CF]  0100 Dr. Juliann Pares came to the emergency department and evaluated the patient in person.  He and I discussed the case in person.  He requested beta-blockers both IV and by mouth and we are proceeding with admission to the hospitalist and Dr. Juliann Pares will reevaluate in the morning to determine the need for going to the Cath Lab.  I have discussed the case with Dr. Anne Hahn with the hospitalist service who will either admit or defer to his colleague, Dr. Sheryle Hail, for admission.  Patient remains pain-free at this time  [CF]    Clinical Course User Index [CF] Loleta Rose, MD    ____________________________________________  FINAL CLINICAL IMPRESSION(S) / ED DIAGNOSES  Final diagnoses:  NSTEMI (non-ST elevated myocardial infarction) [facility])     MEDICATIONS GIVEN  DURING THIS VISIT:  Medications  morphine 4 MG/ML injection 4 mg (4 mg Intravenous Not Given 02/28/17 0451)  ondansetron (ZOFRAN) injection 4 mg (4 mg Intravenous Not Given 02/28/17 0451)  nitroGLYCERIN (NITROSTAT) SL tablet 0.4 mg (0.4 mg Sublingual Given 02/28/17 0003)  heparin ADULT infusion  100 units/mL (25000 units/233mL sodium chloride 0.45%) (1,300 Units/hr Intravenous Handoff 02/28/17 0508)  aspirin EC tablet 81 mg (not administered)  metoprolol tartrate (LOPRESSOR) tablet 25 mg (not administered)  clopidogrel (PLAVIX) tablet 75 mg (not administered)  lisinopril (PRINIVIL,ZESTRIL) tablet 10 mg (not administered)  linagliptin (TRADJENTA) tablet 5 mg (not administered)  atorvastatin (LIPITOR) tablet 40 mg (not administered)  morphine 2 MG/ML injection 2-4 mg (not administered)  acetaminophen (TYLENOL) tablet 650 mg (not administered)    Or  acetaminophen (TYLENOL) suppository 650 mg (not administered)  ondansetron (ZOFRAN) tablet 4 mg (not administered)    Or  ondansetron (ZOFRAN) injection 4 mg (not administered)  docusate sodium (COLACE) capsule 100 mg (not administered)  insulin aspart (novoLOG) injection 0-9 Units (1 Units Subcutaneous Given 02/28/17 0528)  nitroGLYCERIN 50 mg in dextrose 5 % 250 mL (0.2 mg/mL) infusion (10 mcg/min Intravenous Transfusing/Transfer 02/28/17 0529)  gi cocktail (Maalox,Lidocaine,Donnatal) (30 mLs Oral Given 02/27/17 2341)  aspirin chewable tablet 324 mg (324 mg Oral Given 02/28/17 0014)  heparin injection 4,000 Units (4,000 Units Intravenous Given 02/28/17 0004)  sodium chloride 0.9 % bolus 1,000 mL (1,000 mLs Intravenous New Bag/Given 02/28/17 0002)  nitroGLYCERIN 50 mg in dextrose 5 % 250 mL (0.2 mg/mL) infusion (10 mcg/min Intravenous Rate/Dose Change 02/28/17 0106)  metoprolol tartrate (LOPRESSOR) injection 5 mg (5 mg Intravenous Given 02/28/17 0103)  clopidogrel (PLAVIX) tablet 300 mg (300 mg Oral Given 02/28/17 0057)  metoprolol tartrate (LOPRESSOR) tablet 25 mg (25  mg Oral Given 02/28/17 0057)     ED Discharge Orders    None       Note:  This document was prepared using Dragon voice recognition software and may include unintentional dictation errors.    Loleta Rose, MD 02/28/17 [phone]

## 2017-02-27 NOTE — ED Notes (Addendum)
Pt states "On the way over here I felt like someone was trying to push my bones in my chest" Pt is currently diaphoretic and appears restless and unable to get comfortable. Pt denies any dizziness or lightheadedness. Pt states pain will come and go for the last week  all of a sudden even when he is at rest, last around 5-10 mins. Today's pain started around 2140 and has not eased up since starting.

## 2017-02-27 NOTE — ED Triage Notes (Addendum)
PT ambulatory to triage with c/o burning chest pain in the center of his chest that radiates into his neck and makes his arms feel weak. Pt states pain has been ongoing for several days and he was seen by his cardiologist yesterday and was told it was not likely his heart. Pt states pain has not improved since onset. Hx of MI in June. Pt appears uncomfortable and slightly anxious in triage. Pt diaphoretic in triage.

## 2017-02-28 ENCOUNTER — Other Ambulatory Visit: Payer: Self-pay

## 2017-02-28 ENCOUNTER — Encounter: Payer: Self-pay | Admitting: *Deleted

## 2017-02-28 DIAGNOSIS — I25110 Atherosclerotic heart disease of native coronary artery with unstable angina pectoris: Secondary | ICD-10-CM | POA: Diagnosis not present

## 2017-02-28 DIAGNOSIS — Z6835 Body mass index (BMI) 35.0-35.9, adult: Secondary | ICD-10-CM | POA: Diagnosis not present

## 2017-02-28 DIAGNOSIS — I252 Old myocardial infarction: Secondary | ICD-10-CM | POA: Diagnosis not present

## 2017-02-28 DIAGNOSIS — G4733 Obstructive sleep apnea (adult) (pediatric): Secondary | ICD-10-CM | POA: Diagnosis not present

## 2017-02-28 DIAGNOSIS — F17290 Nicotine dependence, other tobacco product, uncomplicated: Secondary | ICD-10-CM | POA: Diagnosis not present

## 2017-02-28 DIAGNOSIS — I10 Essential (primary) hypertension: Secondary | ICD-10-CM | POA: Diagnosis not present

## 2017-02-28 DIAGNOSIS — Z7902 Long term (current) use of antithrombotics/antiplatelets: Secondary | ICD-10-CM | POA: Diagnosis not present

## 2017-02-28 DIAGNOSIS — I214 Non-ST elevation (NSTEMI) myocardial infarction: Secondary | ICD-10-CM | POA: Diagnosis present

## 2017-02-28 DIAGNOSIS — E119 Type 2 diabetes mellitus without complications: Secondary | ICD-10-CM | POA: Diagnosis not present

## 2017-02-28 DIAGNOSIS — E785 Hyperlipidemia, unspecified: Secondary | ICD-10-CM | POA: Diagnosis not present

## 2017-02-28 DIAGNOSIS — Z9114 Patient's other noncompliance with medication regimen: Secondary | ICD-10-CM | POA: Diagnosis not present

## 2017-02-28 DIAGNOSIS — Z955 Presence of coronary angioplasty implant and graft: Secondary | ICD-10-CM | POA: Diagnosis not present

## 2017-02-28 DIAGNOSIS — E669 Obesity, unspecified: Secondary | ICD-10-CM | POA: Diagnosis not present

## 2017-02-28 DIAGNOSIS — Z7982 Long term (current) use of aspirin: Secondary | ICD-10-CM | POA: Diagnosis not present

## 2017-02-28 DIAGNOSIS — Z7984 Long term (current) use of oral hypoglycemic drugs: Secondary | ICD-10-CM | POA: Diagnosis not present

## 2017-02-28 DIAGNOSIS — I248 Other forms of acute ischemic heart disease: Secondary | ICD-10-CM | POA: Diagnosis not present

## 2017-02-28 LAB — CBC
HCT: 44 % (ref 40.0–52.0)
Hemoglobin: 14.7 g/dL (ref 13.0–18.0)
MCH: 27.9 pg (ref 26.0–34.0)
MCHC: 33.3 g/dL (ref 32.0–36.0)
MCV: 83.7 fL (ref 80.0–100.0)
Platelets: 336 10*3/uL (ref 150–440)
RBC: 5.26 MIL/uL (ref 4.40–5.90)
RDW: 13.5 % (ref 11.5–14.5)
WBC: 11.5 10*3/uL — ABNORMAL HIGH (ref 3.8–10.6)

## 2017-02-28 LAB — HEPARIN LEVEL (UNFRACTIONATED): Heparin Unfractionated: <0.1 IU/mL — ABNORMAL LOW (ref 0.30–0.70)

## 2017-02-28 LAB — TROPONIN I
Troponin I: 0.67 ng/mL (ref <0.03)
Troponin I: 1.07 ng/mL (ref <0.03)
Troponin I: 1.18 ng/mL (ref <0.03)

## 2017-02-28 LAB — GLUCOSE, CAPILLARY
Glucose-Capillary: 146 mg/dL — ABNORMAL HIGH (ref 65–99)
Glucose-Capillary: 152 mg/dL — ABNORMAL HIGH (ref 65–99)

## 2017-02-28 LAB — APTT: aPTT: 31 seconds (ref 24–36)

## 2017-02-28 LAB — PROTIME-INR
INR: 1
Prothrombin Time: 13.1 seconds (ref 11.4–15.2)

## 2017-02-28 LAB — TSH: TSH: 1.398 u[IU]/mL (ref 0.350–4.500)

## 2017-02-28 MED ORDER — ASPIRIN EC 81 MG PO TBEC
81.0000 mg | DELAYED_RELEASE_TABLET | Freq: Every day | ORAL | Status: DC
  Administered 2017-02-28: 81 mg via ORAL
  Filled 2017-02-28: qty 1

## 2017-02-28 MED ORDER — NITROGLYCERIN 0.4 MG SL SUBL: 0.4000 mg | SUBLINGUAL_TABLET | SUBLINGUAL | 0 refills | Status: DC | PRN

## 2017-02-28 MED ORDER — INSULIN ASPART 100 UNIT/ML ~~LOC~~ SOLN
0.0000 [IU] | Freq: Four times a day (QID) | SUBCUTANEOUS | Status: DC
  Administered 2017-02-28: 2 [IU] via SUBCUTANEOUS
  Administered 2017-02-28: 1 [IU] via SUBCUTANEOUS
  Filled 2017-02-28: qty 1

## 2017-02-28 MED ORDER — CLOPIDOGREL BISULFATE 75 MG PO TABS
300.0000 mg | ORAL_TABLET | Freq: Once | ORAL | Status: AC
  Administered 2017-02-28: 300 mg via ORAL
  Filled 2017-02-28: qty 4

## 2017-02-28 MED ORDER — METOPROLOL TARTRATE 25 MG PO TABS
25.0000 mg | ORAL_TABLET | Freq: Once | ORAL | Status: AC
  Administered 2017-02-28: 25 mg via ORAL
  Filled 2017-02-28: qty 1

## 2017-02-28 MED ORDER — ONDANSETRON HCL 4 MG/2ML IJ SOLN: 4.0000 mg | Freq: Four times a day (QID) | INTRAMUSCULAR | Status: DC | PRN

## 2017-02-28 MED ORDER — ACETAMINOPHEN 325 MG PO TABS: 650.0000 mg | ORAL_TABLET | Freq: Four times a day (QID) | ORAL | Status: DC | PRN

## 2017-02-28 MED ORDER — LINAGLIPTIN 5 MG PO TABS
5.0000 mg | ORAL_TABLET | Freq: Every day | ORAL | Status: DC
  Administered 2017-02-28: 5 mg via ORAL
  Filled 2017-02-28: qty 1

## 2017-02-28 MED ORDER — METOPROLOL TARTRATE 25 MG PO TABS
25.0000 mg | ORAL_TABLET | Freq: Two times a day (BID) | ORAL | Status: DC
  Administered 2017-02-28: 25 mg via ORAL
  Filled 2017-02-28: qty 1

## 2017-02-28 MED ORDER — ISOSORBIDE MONONITRATE ER 30 MG PO TB24: 30.0000 mg | ORAL_TABLET | Freq: Every day | ORAL | 0 refills | Status: DC

## 2017-02-28 MED ORDER — MORPHINE SULFATE (PF) 2 MG/ML IV SOLN: 2.0000 mg | INTRAVENOUS | Status: DC | PRN

## 2017-02-28 MED ORDER — NITROGLYCERIN IN D5W 200-5 MCG/ML-% IV SOLN: 0.0000 ug/min | INTRAVENOUS | Status: DC

## 2017-02-28 MED ORDER — LISINOPRIL 10 MG PO TABS
10.0000 mg | ORAL_TABLET | Freq: Every day | ORAL | Status: DC
  Administered 2017-02-28: 10 mg via ORAL
  Filled 2017-02-28: qty 1

## 2017-02-28 MED ORDER — HEPARIN BOLUS VIA INFUSION
3000.0000 [IU] | Freq: Once | INTRAVENOUS | Status: AC
  Administered 2017-02-28: 3000 [IU] via INTRAVENOUS
  Filled 2017-02-28: qty 3000

## 2017-02-28 MED ORDER — ACETAMINOPHEN 650 MG RE SUPP: 650.0000 mg | Freq: Four times a day (QID) | RECTAL | Status: DC | PRN

## 2017-02-28 MED ORDER — DOCUSATE SODIUM 100 MG PO CAPS
100.0000 mg | ORAL_CAPSULE | Freq: Two times a day (BID) | ORAL | Status: DC
  Administered 2017-02-28: 100 mg via ORAL
  Filled 2017-02-28: qty 1

## 2017-02-28 MED ORDER — METFORMIN HCL 500 MG PO TABS
1000.0000 mg | ORAL_TABLET | Freq: Two times a day (BID) | ORAL | Status: DC
  Administered 2017-02-28: 1000 mg via ORAL
  Filled 2017-02-28: qty 2

## 2017-02-28 MED ORDER — CLOPIDOGREL BISULFATE 75 MG PO TABS
75.0000 mg | ORAL_TABLET | Freq: Every day | ORAL | Status: DC
  Administered 2017-02-28: 75 mg via ORAL
  Filled 2017-02-28: qty 1

## 2017-02-28 MED ORDER — ATORVASTATIN CALCIUM 20 MG PO TABS
40.0000 mg | ORAL_TABLET | Freq: Every day | ORAL | Status: DC
  Administered 2017-02-28: 40 mg via ORAL
  Filled 2017-02-28: qty 2

## 2017-02-28 MED ORDER — HEPARIN (PORCINE) IN NACL 100-0.45 UNIT/ML-% IJ SOLN
1500.0000 [IU]/h | INTRAMUSCULAR | Status: DC
Start: 2017-02-28 — End: 2017-02-28
  Administered 2017-02-28: 1300 [IU]/h via INTRAVENOUS
  Filled 2017-02-28: qty 250

## 2017-02-28 MED ORDER — ISOSORBIDE MONONITRATE ER 30 MG PO TB24
30.0000 mg | ORAL_TABLET | Freq: Every day | ORAL | Status: DC
  Administered 2017-02-28: 30 mg via ORAL
  Filled 2017-02-28: qty 1

## 2017-02-28 MED ORDER — METOPROLOL TARTRATE 5 MG/5ML IV SOLN
5.0000 mg | Freq: Once | INTRAVENOUS | Status: AC
  Administered 2017-02-28: 5 mg via INTRAVENOUS
  Filled 2017-02-28: qty 5

## 2017-02-28 MED ORDER — ONDANSETRON HCL 4 MG PO TABS: 4.0000 mg | ORAL_TABLET | Freq: Four times a day (QID) | ORAL | Status: DC | PRN

## 2017-02-28 NOTE — Progress Notes (Signed)
Patient admitted this morning for possible NSTEMI Patient evaluated by cardiology as well Continue current plan  Discussed with family

## 2017-02-28 NOTE — Progress Notes (Signed)
Inpatient Diabetes Program Recommendations  AACE/ADA: New Consensus Statement on Inpatient Glycemic Control (2015)  Target Ranges:  Prepandial:   less than 140 mg/dL      Peak postprandial:   less than 180 mg/dL (1-2 hours)      Critically ill patients:  140 - 180 mg/dL   Lab Results  Component Value Date   GLUCAP 152 (H) 02/28/2017   HGBA1C 11.3 (H) 07/05/2016    Review of Glycemic Control  Results for Shannan HarperBATTEN, Rubel L (MRN [id number]) as of 02/28/2017 13:30  Ref. Range 02/28/2017 05:01 02/28/2017 11:49  Glucose-Capillary Latest Ref Range: 65 - 99 mg/dL 132146 (H) 440152 (H)    Diabetes history: Type 2 Outpatient Diabetes medications:  Trulicity 0.75mg  q week, Metformin 1000mg  bid, Tradjenta 5mg  qday  Current orders for Inpatient glycemic control: Tradjenta 5mg /day, Novolog 0-9 units q6h  Inpatient Diabetes Program Recommendations:  Per ADA recommendations "consider performing an A1C on all patients with diabetes or hyperglycemia admitted to the hospital if not performed in the prior 3 months".  Susette RacerJulie Jonnae Fonseca, RN, BA, MHA, CDE Diabetes Coordinator Inpatient Diabetes Program  [phone] (Team Pager) [phone] [facility] Office) 02/28/2017 1:32 PM

## 2017-02-28 NOTE — H&P (Signed)
Jesse [name] is an 47 y.o. male.   Chief Complaint: Chest pain HPI: The patient with past medical history of coronary artery disease status post PCI, diabetes, and hypertension presents to the emergency department with chest pain.  Patient states that he has had chest pain all week.  He went to his cardiologist yesterday and was told to follow-up at some other time.  He then came to the emergency department where he was found to have ST depressions in his inferior leads.  He was started on a nitroglycerin drip for ongoing chest pain.  Troponin was found to be elevated as well and the patient was started on heparin prior to the emergency department staff calling the hospitalist service for further management.  Past Medical History:  Diagnosis Date  . Coronary artery disease    Inferior myocardial infarction in 2004.  He was treated with thrombolytics at [facility].  He then had an inferior MI in March 2007.  He received a Horizon study stent 3.5 x 20 mm in the distal RCA at [facility].  No other obstructive disease.  . Diabetes mellitus    TYPE II  . Erectile dysfunction   . Hyperlipidemia   . Hypertension   . MI (myocardial infarction) ([place]) 2633,3545  . Morbid obesity ([place])   . Sleep apnea     Past Surgical History:  Procedure Laterality Date  . CARDIAC CATHETERIZATION     @ [facility]  . LEFT HEART CATH AND CORONARY ANGIOGRAPHY N/A 07/05/2016   Procedure: Left Heart Cath and Coronary Angiography;  Surgeon: Yolonda Kida, MD;  Location: [facility];  Service: Cardiovascular;  Laterality: N/A;  . SEPTOPLASTY      Family History  Adopted: Yes   Social History:  reports that he has quit smoking. His smoking use included cigarettes. He has a 30.00 pack-year smoking history. he has never used smokeless tobacco. He reports that he does not drink alcohol or use drugs.  Allergies: No Known Allergies  Medications Prior to Admission  Medication Sig Dispense Refill  . aspirin 81 MG EC  tablet Take 1 tablet (81 mg total) by mouth daily. 30 tablet 12  . atorvastatin (LIPITOR) 40 MG tablet Take 40 mg by mouth daily.    . clopidogrel (PLAVIX) 75 MG tablet Take 1 tablet (75 mg total) by mouth daily. 30 tablet 2  . Dulaglutide (TRULICITY) 6.25 WL/8.9HT SOPN Inject 0.75 mg into the skin once a week.    Marland Kitchen lisinopril (PRINIVIL,ZESTRIL) 10 MG tablet Take 1 tablet (10 mg total) by mouth daily. 30 tablet 2  . metFORMIN (GLUCOPHAGE) 1000 MG tablet Take 1 tablet (1,000 mg total) by mouth 2 (two) times daily with a meal. 60 tablet 2  . metoprolol tartrate (LOPRESSOR) 25 MG tablet Take 1 tablet (25 mg total) by mouth 2 (two) times daily. 60 tablet 2  . TRADJENTA 5 MG TABS tablet Take 5 mg by mouth daily.    . traMADol (ULTRAM) 50 MG tablet Take 50 mg by mouth every 6 (six) hours as needed for pain.    Marland Kitchen atorvastatin (LIPITOR) 80 MG tablet Take 1 tablet (80 mg total) by mouth daily. (Patient not taking: Reported on 02/28/2017) 30 tablet 2  . insulin aspart (NOVOLOG) 100 UNIT/ML injection Inject 5 Units into the skin 3 (three) times daily with meals. (Patient not taking: Reported on 02/28/2017) 10 mL 11  . insulin detemir (LEVEMIR) 100 UNIT/ML injection Inject 0.2 mLs (20 Units total) into the skin  at bedtime. (Patient not taking: Reported on 02/28/2017) 10 mL 11    Results for orders placed or performed during the hospital encounter of 02/27/17 (from the past 48 hour(s))  Basic metabolic panel     Status: Abnormal   Collection Time: 02/27/17 10:39 PM  Result Value Ref Range   Sodium 138 135 - 145 mmol/L   Potassium 4.2 3.5 - 5.1 mmol/L   Chloride 102 101 - 111 mmol/L   CO2 26 22 - 32 mmol/L   Glucose, Bld 278 (H) 65 - 99 mg/dL   BUN 8 6 - 20 mg/dL   Creatinine, Ser 0.68 0.61 - 1.24 mg/dL   Calcium 9.2 8.9 - 10.3 mg/dL   GFR calc non Af Amer >60 >60 mL/min   GFR calc Af Amer >60 >60 mL/min    Comment: (NOTE) The eGFR has been calculated using the CKD EPI equation. This calculation has not  been validated in all clinical situations. eGFR's persistently <60 mL/min signify possible Chronic Kidney Disease.    Anion gap 10 5 - 15    Comment: Performed at [facility], [address] [place], [place] [address]  CBC     Status: Abnormal   Collection Time: 02/27/17 10:39 PM  Result Value Ref Range   WBC 12.8 (H) 3.8 - 10.6 K/uL   RBC 5.76 4.40 - 5.90 MIL/uL   Hemoglobin 15.9 13.0 - 18.0 g/dL   HCT 48.1 40.0 - 52.0 %   MCV 83.5 80.0 - 100.0 fL   MCH 27.7 26.0 - 34.0 pg   MCHC 33.1 32.0 - 36.0 g/dL   RDW 13.5 11.5 - 14.5 %   Platelets 361 150 - 440 K/uL    Comment: Performed at [facility], [address] [place], [place] [address]  Troponin I     Status: Abnormal   Collection Time: 02/27/17 10:39 PM  Result Value Ref Range   Troponin I 0.29 (HH) <0.03 ng/mL    Comment: CRITICAL RESULT CALLED TO, READ BACK BY AND VERIFIED WITH LAURIE LEMONS RN AT 2320 02/27/17. MSS Performed at [facility], [address] [place], [place] [address]   Hepatic function panel     Status: Abnormal   Collection Time: 02/27/17 10:39 PM  Result Value Ref Range   Total Protein 7.5 6.5 - 8.1 g/dL   Albumin 3.9 3.5 - 5.0 g/dL   AST 25 15 - 41 U/L   ALT 22 17 - 63 U/L   Alkaline Phosphatase 64 38 - 126 U/L   Total Bilirubin <0.1 (L) 0.3 - 1.2 mg/dL   Bilirubin, Direct <0.1 (L) 0.1 - 0.5 mg/dL   Indirect Bilirubin NOT CALCULATED 0.3 - 0.9 mg/dL    Comment: Performed at [facility], [address] [place], [place] [address]  Lipase, blood     Status: None   Collection Time: 02/27/17 10:39 PM  Result Value Ref Range   Lipase 31 11 - 51 U/L    Comment: Performed at [facility], [address], [place], [place] [address]  Brain natriuretic peptide     Status: None   Collection Time: 02/27/17 10:39 PM  Result Value Ref Range   B Natriuretic Peptide 71.0 0.0 - 100.0 pg/mL    Comment: Performed at [facility], [address]  [place], [place] [address]  APTT     Status: None   Collection Time: 02/27/17 10:39 PM  Result Value Ref Range   aPTT 31 24 -  36 seconds    Comment: Performed at [facility], [address] [place], [place] [address]  Protime-INR     Status: None   Collection Time: 02/27/17 10:39 PM  Result Value Ref Range   Prothrombin Time 13.1 11.4 - 15.2 seconds   INR 1.00     Comment: Performed at [facility], [address] [place], [place] [address]  Glucose, capillary     Status: Abnormal   Collection Time: 02/28/17  5:01 AM  Result Value Ref Range   Glucose-Capillary 146 (H) 65 - 99 mg/dL   Dg Chest Port 1 View  Result Date: 02/27/2017 CLINICAL DATA:  Chest pain EXAM: PORTABLE CHEST 1 VIEW COMPARISON:  12/01/2016 FINDINGS: The heart size and mediastinal contours are within normal limits. Both lungs are clear. The visualized skeletal structures are unremarkable. IMPRESSION: No active disease. Electronically Signed   By: Donavan Foil M.D.   On: 02/27/2017 22:33    Review of Systems  Constitutional: Negative for chills and fever.  HENT: Negative for sore throat and tinnitus.   Eyes: Negative for blurred vision and redness.  Respiratory: Negative for cough and shortness of breath.   Cardiovascular: Positive for chest pain. Negative for palpitations, orthopnea and PND.  Gastrointestinal: Negative for abdominal pain, diarrhea, nausea and vomiting.  Genitourinary: Negative for dysuria, frequency and urgency.  Musculoskeletal: Negative for joint pain and myalgias.  Skin: Negative for rash.       No lesions  Neurological: Negative for speech change, focal weakness and weakness.  Endo/Heme/Allergies: Does not bruise/bleed easily.       No temperature intolerance  Psychiatric/Behavioral: Negative for depression and suicidal ideas.    Blood pressure (!) 142/93, pulse 98, temperature (!) 97.5 F (36.4 C), temperature source Oral, resp. rate 18, height 5' 11"  (1.803 m), weight 117  kg (257 lb 14.4 oz), SpO2 100 %. Physical Exam  Vitals reviewed. Constitutional: He is oriented to person, place, and time. He appears well-developed and well-nourished. No distress.  HENT:  Head: Normocephalic and atraumatic.  Mouth/Throat: Oropharynx is clear and moist.  Eyes: Conjunctivae and EOM are normal. Pupils are equal, round, and reactive to light. No scleral icterus.  Neck: Normal range of motion. Neck supple. No JVD present. No tracheal deviation present. No thyromegaly present.  Cardiovascular: Normal rate and regular rhythm. Exam reveals no gallop and no friction rub.  No murmur heard. Respiratory: Effort normal and breath sounds normal. No respiratory distress.  GI: Soft. Bowel sounds are normal. He exhibits no distension. There is no tenderness.  Genitourinary:  Genitourinary Comments: Deferred  Musculoskeletal: Normal range of motion. He exhibits no edema.  Lymphadenopathy:    He has no cervical adenopathy.  Neurological: He is alert and oriented to person, place, and time. No cranial nerve deficit.  Skin: Skin is warm and dry. No rash noted. No erythema.  Psychiatric: He has a normal mood and affect. His behavior is normal. Judgment and thought content normal.     Assessment/Plan This is a 47 year old male admitted for NSTEMI.  1.  NSTEMI: Ongoing chest pain but resolution of ST depression with nitro drip.  Continue nitro drip until catheterization.  The patient's pain is also improving.  Continue heparin drip.  Consult cardiology.  Continue to follow cardiac biomarkers and monitor telemetry. 2.  CAD: The patient admits to noncompliance with medication regimen.  Continue aspirin and Plavix. 3.  Hypertension: Less than optimal control.  Continue lisinopril and metoprolol.  Consider increasing dose of  beta-blocker to decrease myocardial oxygen demand.   4.  Diabetes mellitus type 2: Hold oral hypoglycemic agents (except Tradjenta) in anticipation of dye load during calf.   Sliding scale insulin while hospitalized. 5.  Hyperlipidemia: Continue statin therapy Exline 6.  DVT prophylaxis: Full dose anticoagulation 7.  GI prophylaxis: None The patient is a full code.  Time spent on admission orders and patient care approximately 45 minutes  Harrie Foreman, MD 02/28/2017, 5:39 AM

## 2017-02-28 NOTE — Discharge Summary (Signed)
[facility]   PATIENT NAME: Jesse [name]    MR#:  [id number]  DATE OF BIRTH:  [birth year]  DATE OF ADMISSION:  02/27/2017 ADMITTING PHYSICIAN: Arnaldo Natal, MD  DATE OF DISCHARGE: 02/28/2017  PRIMARY CARE PHYSICIAN: Leanna Sato, MD    ADMISSION DIAGNOSIS:  NSTEMI (non-ST elevated myocardial infarction) [facility]) [I21.4] Chest pain [R07.9]  DISCHARGE DIAGNOSIS:  Active Problems:  elevated troponin not ACS?NSTEMI CAD   SECONDARY DIAGNOSIS:   Past Medical History:  Diagnosis Date  . Coronary artery disease    Inferior myocardial infarction in 2004.  He was treated with thrombolytics at [facility].  He then had an inferior MI in March 2007.  He received a Horizon study stent 3.5 x 20 mm in the distal RCA at [facility].  No other obstructive disease.  . Diabetes mellitus    TYPE II  . Erectile dysfunction   . Hyperlipidemia   . Hypertension   . MI (myocardial infarction) ([facility]) 4098,1191  . Morbid obesity ([facility])   . Sleep apnea     HOSPITAL COURSE:  47 y/o male with CAD who presented with chest pain.   1. Chest pain with elevated troponin: Patient was admitted to the hospital service.  Patient was evaluated cardiology. Patient did have elevation in troponin with troponin max of 1.18. Cardiology did not feel that the patient had a non-ST elevation MI.  Patient had no chest pain while in the hospital.  Elevation troponin was due to demand ischemia.  Recommendations were to continue outpatient medications and add isosorbide 30 mg.  Patient will have follow-up with Dr. Gae Bon within 1 week.  Patient was also instructed should he have chest pain, chest pressure, shortness of breath he should return to the ER for further evaluation.  2.  Diabetes: Patient will continue ADA diet with outpatient regimen  3.  Essential hypertension: Patient will continue metoprolol, lisinopril and isosorbide was added.  4.  Hyperlipidemia: Continue statin  5.   Tobacco dependence: Patient inhales Vape, and was encouraged to stop and remain abstinent from nicotine.  Patient counseled for minutes.  DISCHARGE CONDITIONS AND DIET:   Stable for discharge on heart healthy diabetic diet  CONSULTS OBTAINED:  Treatment Team:  Dalia Heading, MD  DRUG ALLERGIES:  No Known Allergies  DISCHARGE MEDICATIONS:   Allergies as of 02/28/2017   No Known Allergies     Medication List    STOP taking these medications   insulin aspart 100 UNIT/ML injection Commonly known as:  novoLOG   insulin detemir 100 UNIT/ML injection Commonly known as:  LEVEMIR     TAKE these medications   aspirin 81 MG EC tablet Take 1 tablet (81 mg total) by mouth daily.   atorvastatin 40 MG tablet Commonly known as:  LIPITOR Take 40 mg by mouth daily. What changed:  Another medication with the same name was removed. Continue taking this medication, and follow the directions you see here.   clopidogrel 75 MG tablet Commonly known as:  PLAVIX Take 1 tablet (75 mg total) by mouth daily.   isosorbide mononitrate 30 MG 24 hr tablet Commonly known as:  IMDUR Take 1 tablet (30 mg total) by mouth daily. Start taking on:  03/01/2017   lisinopril 10 MG tablet Commonly known as:  PRINIVIL,ZESTRIL Take 1 tablet (10 mg total) by mouth daily.   metFORMIN 1000 MG tablet Commonly known as:  GLUCOPHAGE Take 1 tablet (1,000 mg total) by mouth 2 (  two) times daily with a meal.   metoprolol tartrate 25 MG tablet Commonly known as:  LOPRESSOR Take 1 tablet (25 mg total) by mouth 2 (two) times daily.   nitroGLYCERIN 0.4 MG SL tablet Commonly known as:  NITROSTAT Place 1 tablet (0.4 mg total) under the tongue every 5 (five) minutes as needed for chest pain.   TRADJENTA 5 MG Tabs tablet Generic drug:  linagliptin Take 5 mg by mouth daily.   traMADol 50 MG tablet Commonly known as:  ULTRAM Take 50 mg by mouth every 6 (six) hours as needed for pain.   TRULICITY 0.75 MG/0.5ML  Sopn Generic drug:  Dulaglutide Inject 0.75 mg into the skin once a week.         Today   CHIEF COMPLAINT:  Patient without chest pain and is doing well.   VITAL SIGNS:  Blood pressure 110/63, pulse 93, temperature (!) 97.5 F (36.4 C), temperature source Oral, resp. rate 14, height 5\' 11"  (1.803 m), weight 117 kg (257 lb 14.4 oz), SpO2 97 %.   REVIEW OF SYSTEMS:  Review of Systems  Constitutional: Negative.  Negative for chills, fever and malaise/fatigue.  HENT: Negative.  Negative for ear discharge, ear pain, hearing loss, nosebleeds and sore throat.   Eyes: Negative.  Negative for blurred vision and pain.  Respiratory: Negative.  Negative for cough, hemoptysis, shortness of breath and wheezing.   Cardiovascular: Negative.  Negative for chest pain, palpitations and leg swelling.  Gastrointestinal: Negative.  Negative for abdominal pain, blood in stool, diarrhea, nausea and vomiting.  Genitourinary: Negative.  Negative for dysuria.  Musculoskeletal: Negative.  Negative for back pain.  Skin: Negative.   Neurological: Negative for dizziness, tremors, speech change, focal weakness, seizures and headaches.  Endo/Heme/Allergies: Negative.  Does not bruise/bleed easily.  Psychiatric/Behavioral: Negative.  Negative for depression, hallucinations and suicidal ideas.     PHYSICAL EXAMINATION:  GENERAL:  47 y.o.-year-old patient lying in the bed with no acute distress.  NECK:  Supple, no jugular venous distention. No thyroid enlargement, no tenderness.  LUNGS: Normal breath sounds bilaterally, no wheezing, rales,rhonchi  No use of accessory muscles of respiration.  CARDIOVASCULAR: S1, S2 normal. No murmurs, rubs, or gallops.  ABDOMEN: Soft, non-tender, non-distended. Bowel sounds present. No organomegaly or mass.  EXTREMITIES: No pedal edema, cyanosis, or clubbing.  PSYCHIATRIC: The patient is alert and oriented x 3.  SKIN: No obvious rash, lesion, or ulcer.   DATA REVIEW:    CBC Recent Labs  Lab 02/28/17 0520  WBC 11.5*  HGB 14.7  HCT 44.0  PLT 336    Chemistries  Recent Labs  Lab 02/27/17 2239  NA 138  K 4.2  CL 102  CO2 26  GLUCOSE 278*  BUN 8  CREATININE 0.68  CALCIUM 9.2  AST 25  ALT 22  ALKPHOS 64  BILITOT <0.1*    Cardiac Enzymes Recent Labs  Lab 02/28/17 0520 02/28/17 1027 02/28/17 1627  TROPONINI 0.67* 1.07* 1.18*    Microbiology Results  @[email or web address] @  RADIOLOGY:  Dg Chest Port 1 View  Result Date: 02/27/2017 CLINICAL DATA:  Chest pain EXAM: PORTABLE CHEST 1 VIEW COMPARISON:  12/01/2016 FINDINGS: The heart size and mediastinal contours are within normal limits. Both lungs are clear. The visualized skeletal structures are unremarkable. IMPRESSION: No active disease. Electronically Signed   By: Jasmine PangKim  Fujinaga M.D.   On: 02/27/2017 22:33      Allergies as of 02/28/2017   No Known Allergies     Medication List  STOP taking these medications   insulin aspart 100 UNIT/ML injection Commonly known as:  novoLOG   insulin detemir 100 UNIT/ML injection Commonly known as:  LEVEMIR     TAKE these medications   aspirin 81 MG EC tablet Take 1 tablet (81 mg total) by mouth daily.   atorvastatin 40 MG tablet Commonly known as:  LIPITOR Take 40 mg by mouth daily. What changed:  Another medication with the same name was removed. Continue taking this medication, and follow the directions you see here.   clopidogrel 75 MG tablet Commonly known as:  PLAVIX Take 1 tablet (75 mg total) by mouth daily.   isosorbide mononitrate 30 MG 24 hr tablet Commonly known as:  IMDUR Take 1 tablet (30 mg total) by mouth daily. Start taking on:  03/01/2017   lisinopril 10 MG tablet Commonly known as:  PRINIVIL,ZESTRIL Take 1 tablet (10 mg total) by mouth daily.   metFORMIN 1000 MG tablet Commonly known as:  GLUCOPHAGE Take 1 tablet (1,000 mg total) by mouth 2 (two) times daily with a meal.   metoprolol tartrate 25 MG  tablet Commonly known as:  LOPRESSOR Take 1 tablet (25 mg total) by mouth 2 (two) times daily.   nitroGLYCERIN 0.4 MG SL tablet Commonly known as:  NITROSTAT Place 1 tablet (0.4 mg total) under the tongue every 5 (five) minutes as needed for chest pain.   TRADJENTA 5 MG Tabs tablet Generic drug:  linagliptin Take 5 mg by mouth daily.   traMADol 50 MG tablet Commonly known as:  ULTRAM Take 50 mg by mouth every 6 (six) hours as needed for pain.   TRULICITY 0.75 MG/0.5ML Sopn Generic drug:  Dulaglutide Inject 0.75 mg into the skin once a week.         Management plans discussed with the patient and he is in agreement. Stable for discharge home  Patient should follow up with cardiology  CODE STATUS:     Code Status Orders  (From admission, onward)        Start     Ordered   02/28/17 0444  Full code  Continuous     02/28/17 0443    Code Status History    Date Active Date Inactive Code Status Order ID Comments User Context   07/05/2016 05:04 07/06/2016 14:06 Full Code [id number]  Arnaldo Natal, MD Inpatient   07/05/2016 04:31 07/05/2016 05:04 Full Code [id number]  Alwyn Pea, MD Inpatient      TOTAL TIME TAKING CARE OF THIS PATIENT: 38 minutes.    Note: This dictation was prepared with Dragon dictation along with smaller phrase technology. Any transcriptional errors that result from this process are unintentional.  Constance Hackenberg M.D on 02/28/2017 at 5:51 PM  Between 7am to 6pm - Pager - [phone] After 6pm go to [email or web address] - Social research officer, government  [facility]  Office  [phone]  CC: Primary care physician; Leanna Sato, MD

## 2017-02-28 NOTE — Progress Notes (Signed)
ANTICOAGULATION CONSULT NOTE - Initial Consult  Pharmacy Consult for heparin drip Indication: NSTEMI  No Known Allergies  Patient Measurements: Height: 5\' 11"  (180.3 cm) Weight: 257 lb (116.6 kg) IBW/kg (Calculated) : 75.3 Heparin Dosing Weight: 101 kg  Vital Signs: Temp: 98.6 F (37 C) (02/07 2219) Temp Source: Oral (02/07 2219) BP: 153/97 (02/07 2300) Pulse Rate: 115 (02/07 2219)  Labs: Recent Labs    02/27/17 2239  HGB 15.9  HCT 48.1  PLT 361  CREATININE 0.68  TROPONINI 0.29*    Estimated Creatinine Clearance: 149.8 mL/min (by C-G formula based on SCr of 0.68 mg/dL).   Medical History: Past Medical History:  Diagnosis Date  . Coronary artery disease    Inferior myocardial infarction in 2004.  He was [facility].  He then had an inferior MI in March 2007.  He received a Horizon study stent 3.5 x 20 mm in the distal RCA [facility].  No other obstructive disease.  . Diabetes mellitus    TYPE II  . Erectile dysfunction   . Hyperlipidemia   . Hypertension   . MI (myocardial infarction) ([facility]) [id number],2007  . Morbid obesity ([facility])   . Sleep apnea     Medications:  No anticoagulation in PTA meds  Assessment: Trop 0.29  Goal of Therapy:  Heparin level 0.3-0.7 units/ml Monitor platelets by anticoagulation protocol: Yes   Plan:  4000 unit bolus and initial rate of 1300 units/hr. First heparin level 6 hours after start of infusion.  Doneshia Hill S 02/28/2017,12:06 AM

## 2017-02-28 NOTE — Progress Notes (Signed)
ANTICOAGULATION CONSULT NOTE - Initial Consult  Pharmacy Consult for heparin drip Indication: NSTEMI  No Known Allergies  Patient Measurements: Height: 5\' 11"  (180.3 cm) Weight: 257 lb 14.4 oz (117 kg) IBW/kg (Calculated) : 75.3 Heparin Dosing Weight: 101 kg  Vital Signs: Temp: 97.5 F (36.4 C) (02/08 0444) Temp Source: Oral (02/08 0444) BP: 110/63 (02/08 0723) Pulse Rate: 93 (02/08 0723)  Labs: Recent Labs    02/27/17 2239 02/28/17 0520  HGB 15.9 14.7  HCT 48.1 44.0  PLT 361 336  APTT 31  --   LABPROT 13.1  --   INR 1.00  --   HEPARINUNFRC  --  <0.10*  CREATININE 0.68  --   TROPONINI 0.29* 0.67*    Estimated Creatinine Clearance: 150.1 mL/min (by C-G formula based on SCr of 0.68 mg/dL).   Medical History: Past Medical History:  Diagnosis Date  . Coronary artery disease    Inferior myocardial infarction in 2004.  [facility].  He then had an inferior MI in March 2007.  He received a Horizon study stent 3.5 x 20 mm in the distal RCA at [facility].  No other obstructive disease.  . Diabetes mellitus    TYPE II  . Erectile dysfunction   . Hyperlipidemia   . Hypertension   . MI (myocardial infarction) ([facility]CC) [id number],2007  . Morbid obesity (HCC)   . Sleep apnea     Medications:  No anticoagulation in PTA meds  Assessment: Trop 0.29  Goal of Therapy:  Heparin level 0.3-0.7 units/ml Monitor platelets by anticoagulation protocol: Yes   Plan:  4000 unit bolus and initial rate of 1300 units/hr. First heparin level 6 hours after start of infusion.  2/8@0730  verified with RN heparin running at 1300 units/hr iv. HL <0.10, will bolus with heparin 3000 units and increase rate from 1300 units/hr to 1500 units/hr. Recheck heparin level at 1300.   Gerre PebblesGarrett Anyely Cunning 02/28/2017,7:28 AM

## 2017-02-28 NOTE — Progress Notes (Signed)
Notified by Dr Juliann Paresallwood to stop heparin gtt and nitro gtt and have patient ambulate multiple times to see if patient has chest pain. Will proceed and continue to monitor.

## 2017-02-28 NOTE — Consult Note (Signed)
Reason for Consult: Unstable angina possible non-STEMI Referring Physician: Dr. Delight Stare primary, Dr. Marcille Blanco hospitalist, Dr Karma Greaser ER Cardiologist [name]  Jesse [name] is an 47 y.o. male.  HPI: Patient is a 47 year old white male history of multiple medical problems known coronary disease reported myocardial infarction in 2004  And 2007.  Patient most recently had a STEMI June 2018 with inferior infarct placement of overlapping DES stents to mid RCA.Marland Kitchen Patient was placed on aspirin Plavix beta-blocker ACE inhibitor statin.  Reportedly patient has been noncompliant on multiple occasions with his Plavix most recently over the last few days he ran out and quit taking it he states that he is resumed recently.  Back in November the patient had quit taking it for at least a week.  Patient states she is been having on and off chest discomfort and angina mostly with exertion.  Patient tonight started having recurrent chest discomfort at rest at 21:45.  He states the pain was not as bad as when he had his heart attack in June 2018 but it was significant because he was at rest patient had shortness of breath diaphoresis he came to the emergency room initial EKG was nondiagnostic but had subtle ST changes so the patient was treated aggressively medically and cardiology was consulted for further assessment evaluation.  EKG did not meet STEMI criteria.  Patient got nitroglycerin and heparin aspirin and started having significant improvement in symptoms follow-up EKG were also somewhat improved.  Patient states to be compliant with his multiple diabetes medications but has been relatively noncompliant with his cardiac medicines.  Past Medical History:  Diagnosis Date  . Coronary artery disease    Inferior myocardial infarction in 2004.  He was treated with thrombolytics at [facility].  He then had an inferior MI in March 2007.  He received a Horizon study stent 3.5 x 20 mm in the distal RCA at [facility].  No other  obstructive disease.  . Diabetes mellitus    TYPE II  . Erectile dysfunction   . Hyperlipidemia   . Hypertension   . MI (myocardial infarction) ([place]) 5790,3833  . Morbid obesity ([place])   . Sleep apnea     Past Surgical History:  Procedure Laterality Date  . CARDIAC CATHETERIZATION     @ [place]  . LEFT HEART CATH AND CORONARY ANGIOGRAPHY N/A 07/05/2016   Procedure: Left Heart Cath and Coronary Angiography;  Surgeon: Yolonda Kida, MD;  Location: [facility];  Service: Cardiovascular;  Laterality: N/A;  . SEPTOPLASTY      Family History  Adopted: Yes    Social History:  reports that he has quit smoking. His smoking use included cigarettes. He has a 30.00 pack-year smoking history. he has never used smokeless tobacco. He reports that he does not drink alcohol or use drugs.  Allergies: No Known Allergies  Medications: I have reviewed the patient's current medications.  Results for orders placed or performed during the hospital encounter of 02/27/17 (from the past 48 hour(s))  Basic metabolic panel     Status: Abnormal   Collection Time: 02/27/17 10:39 PM  Result Value Ref Range   Sodium 138 135 - 145 mmol/L   Potassium 4.2 3.5 - 5.1 mmol/L   Chloride 102 101 - 111 mmol/L   CO2 26 22 - 32 mmol/L   Glucose, Bld 278 (H) 65 - 99 mg/dL   BUN 8 6 - 20 mg/dL   Creatinine, Ser 0.68 0.61 - 1.24 mg/dL  Calcium 9.2 8.9 - 10.3 mg/dL   GFR calc non Af Amer >60 >60 mL/min   GFR calc Af Amer >60 >60 mL/min    Comment: (NOTE) The eGFR has been calculated using the CKD EPI equation. This calculation has not been validated in all clinical situations. eGFR's persistently <60 mL/min signify possible Chronic Kidney Disease.    Anion gap 10 5 - 15    Comment: Performed at [facility], [address] [place], [place] [address]  CBC     Status: Abnormal   Collection Time: 02/27/17 10:39 PM  Result Value Ref Range   WBC 12.8 (H) 3.8 - 10.6 K/uL   RBC 5.76 4.40 - 5.90  MIL/uL   Hemoglobin 15.9 13.0 - 18.0 g/dL   HCT 48.1 40.0 - 52.0 %   MCV 83.5 80.0 - 100.0 fL   MCH 27.7 26.0 - 34.0 pg   MCHC 33.1 32.0 - 36.0 g/dL   RDW 13.5 11.5 - 14.5 %   Platelets 361 150 - 440 K/uL    Comment: Performed at [facility], [address] [place], [place] [address]  Troponin I     Status: Abnormal   Collection Time: 02/27/17 10:39 PM  Result Value Ref Range   Troponin I 0.29 (HH) <0.03 ng/mL    Comment: CRITICAL RESULT CALLED TO, READ BACK BY AND VERIFIED WITH LAURIE LEMONS RN AT 2320 02/27/17. MSS Performed at [facility], [address] [place], [place] [address]   Hepatic function panel     Status: Abnormal   Collection Time: 02/27/17 10:39 PM  Result Value Ref Range   Total Protein 7.5 6.5 - 8.1 g/dL   Albumin 3.9 3.5 - 5.0 g/dL   AST 25 15 - 41 U/L   ALT 22 17 - 63 U/L   Alkaline Phosphatase 64 38 - 126 U/L   Total Bilirubin <0.1 (L) 0.3 - 1.2 mg/dL   Bilirubin, Direct <0.1 (L) 0.1 - 0.5 mg/dL   Indirect Bilirubin NOT CALCULATED 0.3 - 0.9 mg/dL    Comment: Performed at [facility], [address] [place], [place] [address]  Lipase, blood     Status: None   Collection Time: 02/27/17 10:39 PM  Result Value Ref Range   Lipase 31 11 - 51 U/L    Comment: Performed at [facility], [address] [place], [place] [address]  Brain natriuretic peptide     Status: None   Collection Time: 02/27/17 10:39 PM  Result Value Ref Range   B Natriuretic Peptide 71.0 0.0 - 100.0 pg/mL    Comment: Performed at [facility], [address] [place], [place] [address]    Dg Chest Port 1 View  Result Date: 02/27/2017 CLINICAL DATA:  Chest pain EXAM: PORTABLE CHEST 1 VIEW COMPARISON:  12/01/2016 FINDINGS: The heart size and mediastinal contours are within normal limits. Both lungs are clear. The visualized skeletal structures are unremarkable. IMPRESSION: No active disease. Electronically Signed   By: Donavan Foil M.D.    On: 02/27/2017 22:33    Review of Systems  Constitutional: Positive for diaphoresis and malaise/fatigue.  HENT: Negative.   Eyes: Negative.   Respiratory: Positive for shortness of breath.   Cardiovascular: Positive for chest pain.  Gastrointestinal: Negative.   Genitourinary: Negative.   Musculoskeletal: Negative.   Skin: Negative.   Neurological: Positive for weakness.  Endo/Heme/Allergies: Negative.   Psychiatric/Behavioral: Negative.    Blood pressure (!) 153/97, pulse (!) 115, temperature 98.6  F (37 C), temperature source Oral, resp. rate (!) 26, height _0  (1.803 m), weight 257 lb (116.6 kg), SpO2 98 %. Physical Exam  Nursing note and vitals reviewed. Constitutional: He is oriented to person, place, and time. He appears well-developed and well-nourished.  HENT:  Head: Normocephalic and atraumatic.  Eyes: Conjunctivae and EOM are normal. Pupils are equal, round, and reactive to light.  Neck: Normal range of motion. Neck supple.  Cardiovascular: Normal rate, regular rhythm and normal heart sounds.  Respiratory: Effort normal and breath sounds normal.  GI: Soft. Bowel sounds are normal.  Musculoskeletal: Normal range of motion.  Neurological: He is alert and oriented to person, place, and time. He has normal reflexes.  Skin: Skin is warm and dry.  Psychiatric: He has a normal mood and affect.    Assessment/Plan: Unstable angina Non-STEMI Noncompliance Coronary artery disease History of PCI and stent to RCA with DES History of STEMI Hypertension Diabetes Obesity History of tobacco abuse states that he quit Elevated troponin probable non-STEMI Obstructive sleep apnea . Plan Agree with admission rule out myocardial infarction Follow up EKG and troponins Reload Plavix 300 mg p.o. 75 a day Consider switching to Brilinta Continue diabetes management and control Recommend heparin intravenously Aspirin beta blocker ACE inhibitor IV nitroglycerin  therapy Transfer the patient to ICU Recommend sleep study consider CPAP weight loss Consider invasive strategy if patient does not improve   Sidi Dzikowski D Chaniah Cisse 02/28/2017, 12:57 AM

## 2017-02-28 NOTE — Progress Notes (Signed)
Patient discharged via wheelchair and private vehicle. IV removed and catheter intact. All discharge instructions given and patient verbalizes understanding. Tele removed and returned. Prescriptions given to patient No distress noted.   

## 2017-03-24 ENCOUNTER — Encounter: Payer: Self-pay | Admitting: General Surgery

## 2017-08-12 ENCOUNTER — Ambulatory Visit
Payer: Medicare Other | Attending: Student in an Organized Health Care Education/Training Program | Admitting: Student in an Organized Health Care Education/Training Program

## 2018-05-08 IMAGING — DX DG CHEST 1V
1 series · 1 of 1 positions shown · non-contrast
Comparison: 04/29/2006

CLINICAL DATA: Heart failure

EXAM:
CHEST 1 VIEW

[chest ap]
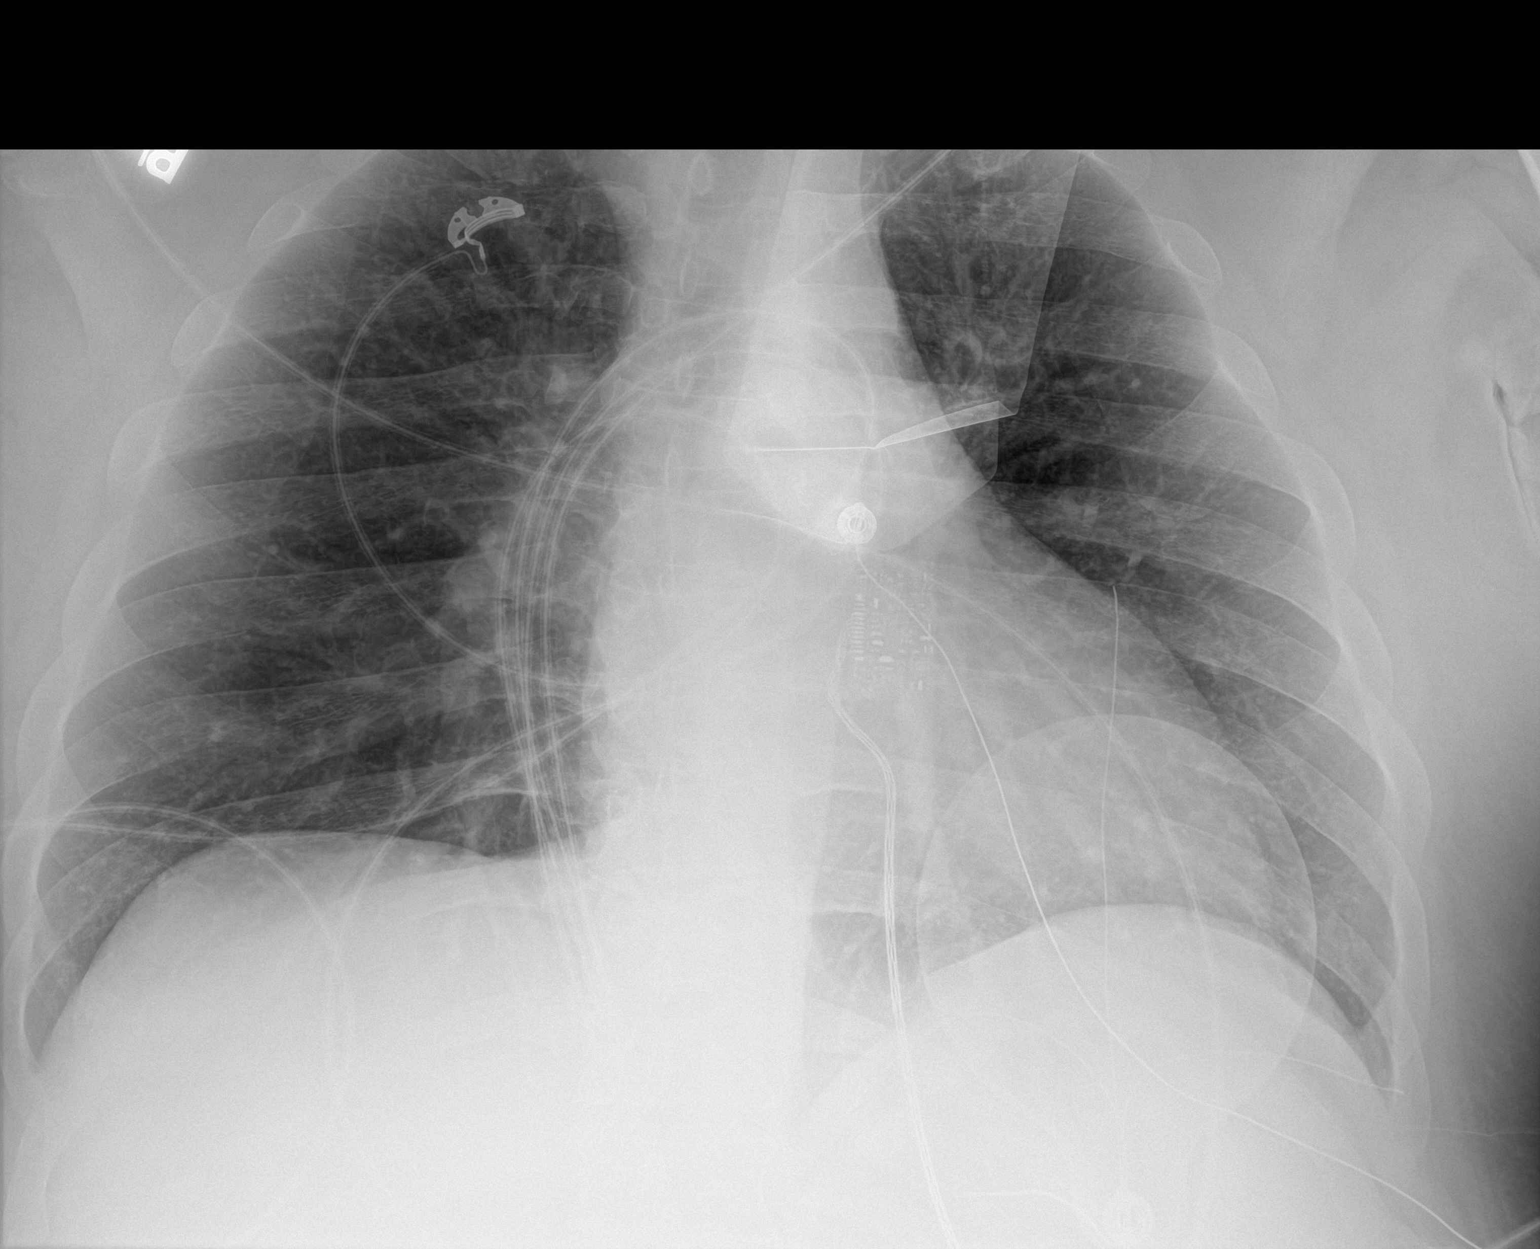

[1 of 1 positions shown; findings below may reference images not displayed]

FINDINGS: Cardiac shadow it is stable at the upper limits of normal. The lungs
are well aerated bilaterally. No focal infiltrate or sizable
effusion is seen. No bony abnormality is noted.
IMPRESSION: No acute abnormality seen.

## 2018-05-09 IMAGING — DX DG CHEST 1V PORT
1 series · 2 of 2 positions shown · non-contrast
Comparison: 07/05/2016

CLINICAL DATA: Short of breath

EXAM:
PORTABLE CHEST 1 VIEW

[Series 1: chest ap · 0.14mm/px · 2 of 2 slices shown]
[im 1/2]
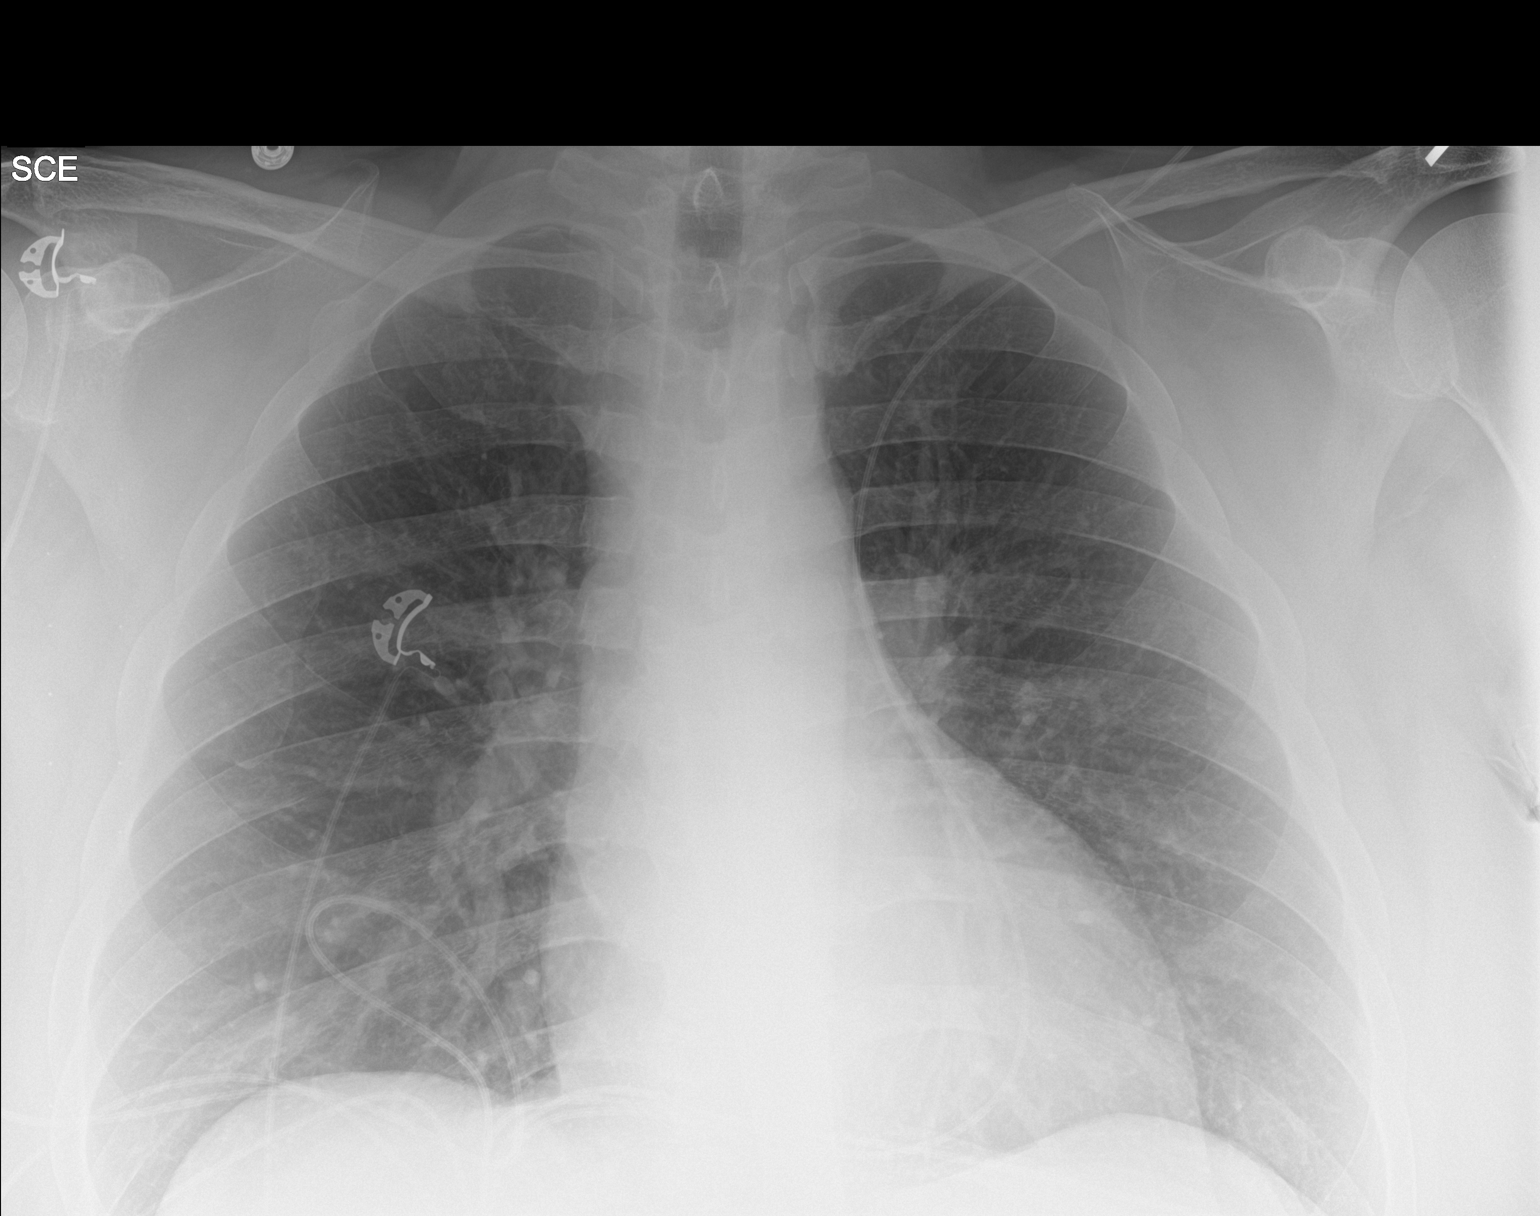
[im 2/2]
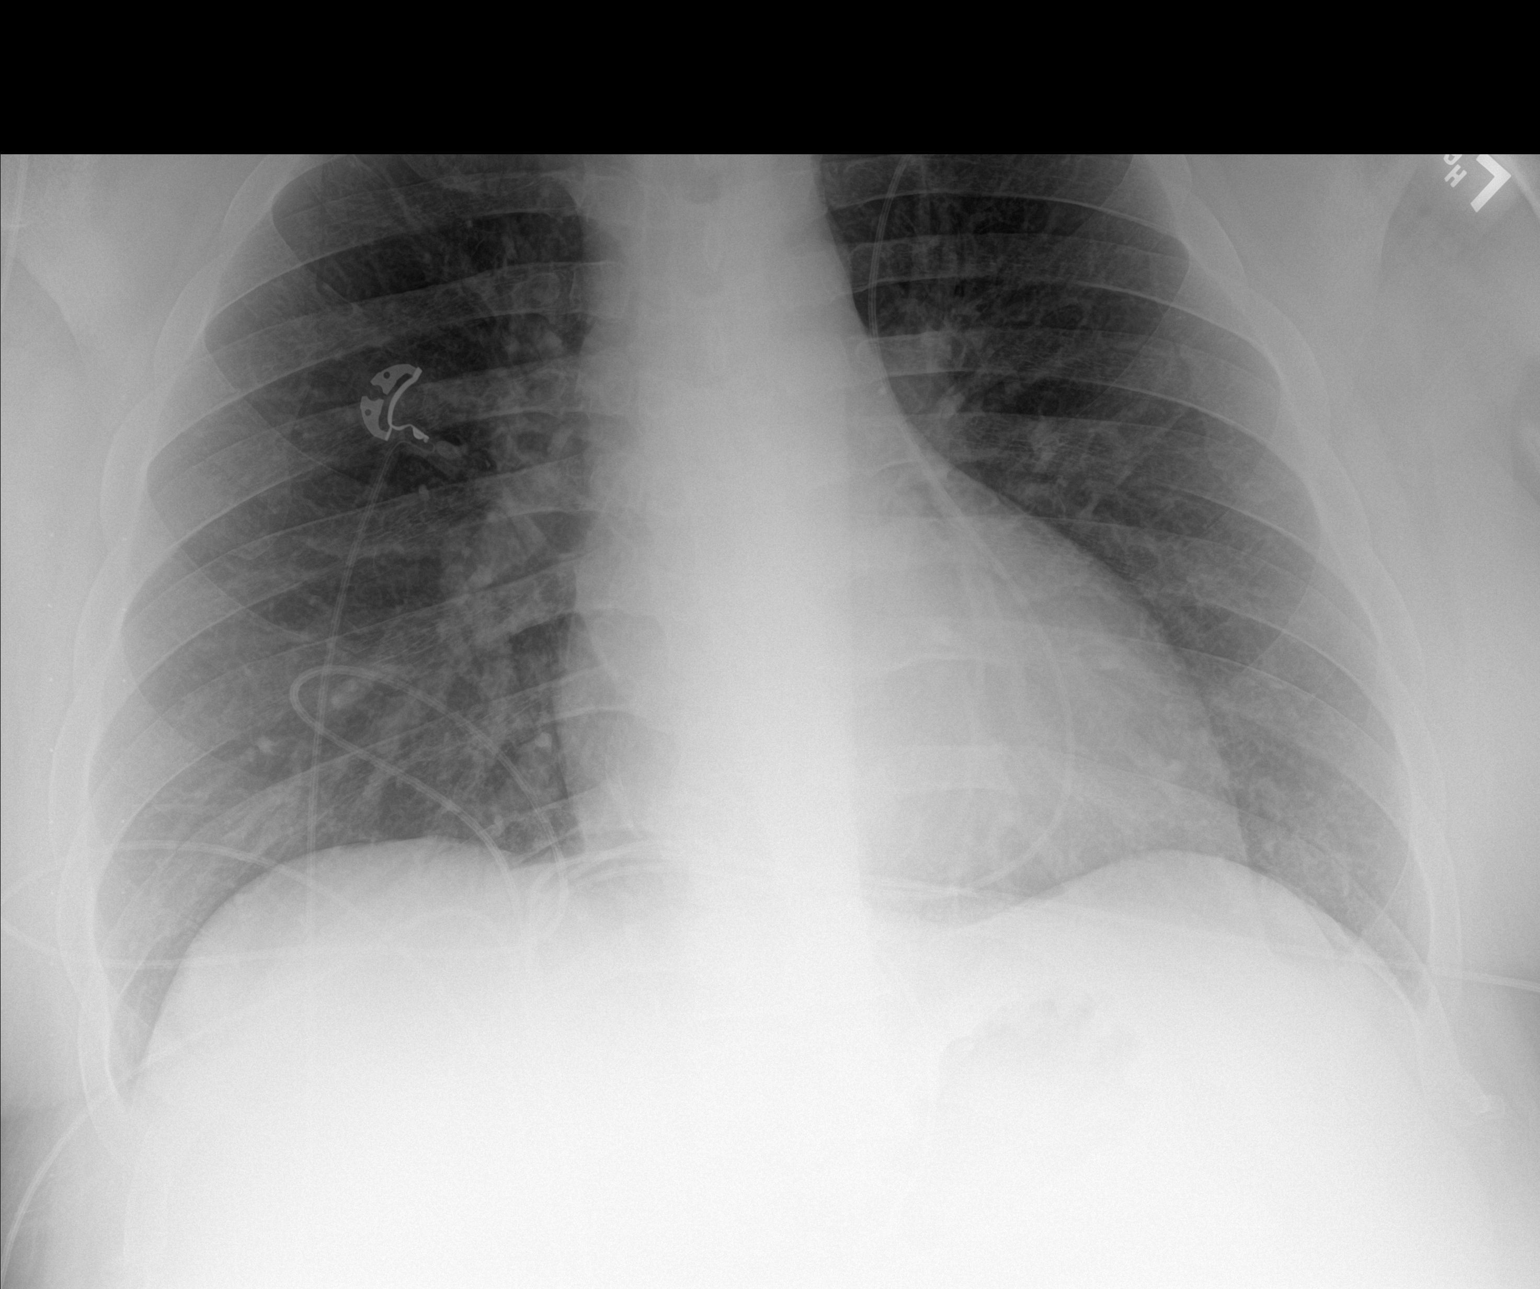

[2 of 2 positions shown; findings below may reference images not displayed]

FINDINGS: Cardiac enlargement without heart failure. Lungs remain clear
without infiltrate or effusion. No interval change.
IMPRESSION: No active disease.

## 2018-12-30 ENCOUNTER — Ambulatory Visit: Payer: Self-pay | Admitting: Urology

## 2018-12-30 ENCOUNTER — Encounter: Payer: Self-pay | Admitting: Urology

## 2019-02-12 ENCOUNTER — Ambulatory Visit: Payer: Medicare Other | Admitting: Urology

## 2019-02-15 ENCOUNTER — Ambulatory Visit: Payer: Medicare Other | Admitting: Urology

## 2019-03-06 ENCOUNTER — Emergency Department: Payer: Medicare Other

## 2019-03-06 ENCOUNTER — Inpatient Hospital Stay
Admission: EM | Admit: 2019-03-06 | Discharge: 2019-03-08 | DRG: 345 | Disposition: A | Discharge status: Home / Self Care | Payer: Medicare Other | Attending: Family Medicine | Admitting: Family Medicine

## 2019-03-06 ENCOUNTER — Encounter: Payer: Self-pay | Admitting: Intensive Care

## 2019-03-06 ENCOUNTER — Other Ambulatory Visit: Payer: Self-pay

## 2019-03-06 DIAGNOSIS — Z79899 Other long term (current) drug therapy: Secondary | ICD-10-CM | POA: Diagnosis not present

## 2019-03-06 DIAGNOSIS — Z7902 Long term (current) use of antithrombotics/antiplatelets: Secondary | ICD-10-CM | POA: Diagnosis not present

## 2019-03-06 DIAGNOSIS — Z7982 Long term (current) use of aspirin: Secondary | ICD-10-CM | POA: Diagnosis not present

## 2019-03-06 DIAGNOSIS — I25110 Atherosclerotic heart disease of native coronary artery with unstable angina pectoris: Secondary | ICD-10-CM | POA: Diagnosis not present

## 2019-03-06 DIAGNOSIS — E1165 Type 2 diabetes mellitus with hyperglycemia: Secondary | ICD-10-CM | POA: Diagnosis present

## 2019-03-06 DIAGNOSIS — Z20822 Contact with and (suspected) exposure to covid-19: Secondary | ICD-10-CM | POA: Diagnosis present

## 2019-03-06 DIAGNOSIS — E785 Hyperlipidemia, unspecified: Secondary | ICD-10-CM | POA: Diagnosis present

## 2019-03-06 DIAGNOSIS — I10 Essential (primary) hypertension: Secondary | ICD-10-CM | POA: Diagnosis present

## 2019-03-06 DIAGNOSIS — L03317 Cellulitis of buttock: Secondary | ICD-10-CM | POA: Diagnosis present

## 2019-03-06 DIAGNOSIS — I255 Ischemic cardiomyopathy: Secondary | ICD-10-CM | POA: Diagnosis present

## 2019-03-06 DIAGNOSIS — Z6836 Body mass index (BMI) 36.0-36.9, adult: Secondary | ICD-10-CM

## 2019-03-06 DIAGNOSIS — G4733 Obstructive sleep apnea (adult) (pediatric): Secondary | ICD-10-CM | POA: Diagnosis present

## 2019-03-06 DIAGNOSIS — K611 Rectal abscess: Secondary | ICD-10-CM | POA: Diagnosis present

## 2019-03-06 DIAGNOSIS — F17290 Nicotine dependence, other tobacco product, uncomplicated: Secondary | ICD-10-CM | POA: Diagnosis present

## 2019-03-06 DIAGNOSIS — L0231 Cutaneous abscess of buttock: Secondary | ICD-10-CM | POA: Diagnosis not present

## 2019-03-06 DIAGNOSIS — Z7984 Long term (current) use of oral hypoglycemic drugs: Secondary | ICD-10-CM | POA: Diagnosis not present

## 2019-03-06 DIAGNOSIS — E669 Obesity, unspecified: Secondary | ICD-10-CM | POA: Diagnosis present

## 2019-03-06 DIAGNOSIS — Z955 Presence of coronary angioplasty implant and graft: Secondary | ICD-10-CM

## 2019-03-06 DIAGNOSIS — I252 Old myocardial infarction: Secondary | ICD-10-CM

## 2019-03-06 DIAGNOSIS — I2510 Atherosclerotic heart disease of native coronary artery without angina pectoris: Secondary | ICD-10-CM | POA: Diagnosis present

## 2019-03-06 LAB — LACTIC ACID, PLASMA
Lactic Acid, Venous: 1.1 mmol/L (ref 0.5–1.9)
Lactic Acid, Venous: 1.2 mmol/L (ref 0.5–1.9)

## 2019-03-06 LAB — CBC WITH DIFFERENTIAL/PLATELET
Abs Immature Granulocytes: 0.21 10*3/uL — ABNORMAL HIGH (ref 0.00–0.07)
Basophils Absolute: 0.1 10*3/uL (ref 0.0–0.1)
Basophils Relative: 1 %
Eosinophils Absolute: 0.5 10*3/uL (ref 0.0–0.5)
Eosinophils Relative: 4 %
HCT: 47.8 % (ref 39.0–52.0)
Hemoglobin: 16 g/dL (ref 13.0–17.0)
Immature Granulocytes: 2 %
Lymphocytes Relative: 7 %
Lymphs Abs: 0.9 10*3/uL (ref 0.7–4.0)
MCH: 28 pg (ref 26.0–34.0)
MCHC: 33.5 g/dL (ref 30.0–36.0)
MCV: 83.6 fL (ref 80.0–100.0)
Monocytes Absolute: 1 10*3/uL (ref 0.1–1.0)
Monocytes Relative: 8 %
Neutro Abs: 10.2 10*3/uL — ABNORMAL HIGH (ref 1.7–7.7)
Neutrophils Relative %: 78 %
Platelets: 376 10*3/uL (ref 150–400)
RBC: 5.72 MIL/uL (ref 4.22–5.81)
RDW: 12.4 % (ref 11.5–15.5)
WBC: 12.9 10*3/uL — ABNORMAL HIGH (ref 4.0–10.5)
nRBC: 0 % (ref 0.0–0.2)

## 2019-03-06 LAB — BASIC METABOLIC PANEL
Anion gap: 14 (ref 5–15)
BUN: 17 mg/dL (ref 6–20)
CO2: 25 mmol/L (ref 22–32)
Calcium: 9.1 mg/dL (ref 8.9–10.3)
Chloride: 96 mmol/L — ABNORMAL LOW (ref 98–111)
Creatinine, Ser: 0.71 mg/dL (ref 0.61–1.24)
GFR calc Af Amer: >60 mL/min (ref >60)
GFR calc non Af Amer: >60 mL/min (ref >60)
Glucose, Bld: 204 mg/dL — ABNORMAL HIGH (ref 70–99)
Potassium: 3.7 mmol/L (ref 3.5–5.1)
Sodium: 135 mmol/L (ref 135–145)

## 2019-03-06 LAB — GLUCOSE, CAPILLARY: Glucose-Capillary: 157 mg/dL — ABNORMAL HIGH (ref 70–99)

## 2019-03-06 MED ORDER — LIDOCAINE HCL (PF) 1 % IJ SOLN
5.0000 mL | Freq: Once | INTRAMUSCULAR | Status: AC
  Administered 2019-03-06: 5 mL via INTRADERMAL
  Filled 2019-03-06: qty 5

## 2019-03-06 MED ORDER — SODIUM CHLORIDE 0.9 % IV SOLN: INTRAVENOUS | Status: DC

## 2019-03-06 MED ORDER — ACETAMINOPHEN 325 MG PO TABS: 650.0000 mg | ORAL_TABLET | Freq: Four times a day (QID) | ORAL | Status: DC | PRN

## 2019-03-06 MED ORDER — INSULIN ASPART 100 UNIT/ML ~~LOC~~ SOLN
0.0000 [IU] | Freq: Three times a day (TID) | SUBCUTANEOUS | Status: DC
  Administered 2019-03-07 – 2019-03-08 (×4): 3 [IU] via SUBCUTANEOUS
  Filled 2019-03-06 (×4): qty 1

## 2019-03-06 MED ORDER — ACETAMINOPHEN 650 MG RE SUPP: 650.0000 mg | Freq: Four times a day (QID) | RECTAL | Status: DC | PRN

## 2019-03-06 MED ORDER — SODIUM CHLORIDE 0.9 % IV BOLUS
500.0000 mL | Freq: Once | INTRAVENOUS | Status: AC
  Administered 2019-03-06: 500 mL via INTRAVENOUS

## 2019-03-06 MED ORDER — LISINOPRIL 10 MG PO TABS
10.0000 mg | ORAL_TABLET | Freq: Every day | ORAL | Status: DC
  Administered 2019-03-07 – 2019-03-08 (×2): 10 mg via ORAL
  Filled 2019-03-06 (×2): qty 1

## 2019-03-06 MED ORDER — VANCOMYCIN HCL 2000 MG/400ML IV SOLN
2000.0000 mg | Freq: Once | INTRAVENOUS | Status: AC
  Administered 2019-03-07: 2000 mg via INTRAVENOUS
  Filled 2019-03-06: qty 400

## 2019-03-06 MED ORDER — NITROGLYCERIN 0.4 MG SL SUBL: 0.4000 mg | SUBLINGUAL_TABLET | SUBLINGUAL | Status: DC | PRN

## 2019-03-06 MED ORDER — TRAMADOL HCL 50 MG PO TABS
50.0000 mg | ORAL_TABLET | Freq: Four times a day (QID) | ORAL | Status: DC | PRN
  Administered 2019-03-07 – 2019-03-08 (×2): 50 mg via ORAL
  Filled 2019-03-06 (×2): qty 1

## 2019-03-06 MED ORDER — LINAGLIPTIN 5 MG PO TABS
5.0000 mg | ORAL_TABLET | Freq: Every day | ORAL | Status: DC
  Administered 2019-03-07 – 2019-03-08 (×2): 5 mg via ORAL
  Filled 2019-03-06 (×2): qty 1

## 2019-03-06 MED ORDER — TRAMADOL HCL 50 MG PO TABS
50.0000 mg | ORAL_TABLET | Freq: Once | ORAL | Status: AC
  Administered 2019-03-06: 50 mg via ORAL
  Filled 2019-03-06: qty 1

## 2019-03-06 MED ORDER — SODIUM CHLORIDE 0.9 % IV SOLN
2.0000 g | Freq: Three times a day (TID) | INTRAVENOUS | Status: DC
  Administered 2019-03-07: 2 g via INTRAVENOUS
  Filled 2019-03-06 (×3): qty 2

## 2019-03-06 MED ORDER — IOHEXOL 300 MG/ML  SOLN
100.0000 mL | Freq: Once | INTRAMUSCULAR | Status: AC | PRN
  Administered 2019-03-06: 100 mL via INTRAVENOUS
  Filled 2019-03-06: qty 100

## 2019-03-06 MED ORDER — ATORVASTATIN CALCIUM 20 MG PO TABS
80.0000 mg | ORAL_TABLET | Freq: Every day | ORAL | Status: DC
  Administered 2019-03-07: 80 mg via ORAL
  Filled 2019-03-06: qty 4

## 2019-03-06 MED ORDER — METOPROLOL TARTRATE 25 MG PO TABS
25.0000 mg | ORAL_TABLET | Freq: Two times a day (BID) | ORAL | Status: DC
  Administered 2019-03-06 – 2019-03-08 (×4): 25 mg via ORAL
  Filled 2019-03-06 (×4): qty 1

## 2019-03-06 MED ORDER — KETOROLAC TROMETHAMINE 30 MG/ML IJ SOLN
30.0000 mg | Freq: Four times a day (QID) | INTRAMUSCULAR | Status: DC | PRN
  Administered 2019-03-07 (×3): 30 mg via INTRAVENOUS
  Filled 2019-03-06 (×3): qty 1

## 2019-03-06 MED ORDER — CLOPIDOGREL BISULFATE 75 MG PO TABS
75.0000 mg | ORAL_TABLET | Freq: Every day | ORAL | Status: DC
  Administered 2019-03-07 – 2019-03-08 (×2): 75 mg via ORAL
  Filled 2019-03-06 (×2): qty 1

## 2019-03-06 MED ORDER — ONDANSETRON HCL 4 MG PO TABS: 4.0000 mg | ORAL_TABLET | Freq: Four times a day (QID) | ORAL | Status: DC | PRN

## 2019-03-06 MED ORDER — PIPERACILLIN-TAZOBACTAM 3.375 G IVPB 30 MIN
3.3750 g | Freq: Once | INTRAVENOUS | Status: AC
  Administered 2019-03-06: 3.375 g via INTRAVENOUS
  Filled 2019-03-06: qty 50

## 2019-03-06 MED ORDER — DULAGLUTIDE 0.75 MG/0.5ML ~~LOC~~ SOAJ: 0.7500 mg | SUBCUTANEOUS | Status: DC

## 2019-03-06 MED ORDER — ONDANSETRON HCL 4 MG/2ML IJ SOLN: 4.0000 mg | Freq: Four times a day (QID) | INTRAMUSCULAR | Status: DC | PRN

## 2019-03-06 MED ORDER — ONDANSETRON HCL 4 MG/2ML IJ SOLN
4.0000 mg | Freq: Once | INTRAMUSCULAR | Status: DC
  Filled 2019-03-06: qty 2

## 2019-03-06 MED ORDER — ENOXAPARIN SODIUM 40 MG/0.4ML ~~LOC~~ SOLN
40.0000 mg | Freq: Two times a day (BID) | SUBCUTANEOUS | Status: DC
  Administered 2019-03-07 (×3): 40 mg via SUBCUTANEOUS
  Filled 2019-03-06 (×3): qty 0.4

## 2019-03-06 MED ORDER — MORPHINE SULFATE (PF) 2 MG/ML IV SOLN
2.0000 mg | Freq: Once | INTRAVENOUS | Status: DC
  Filled 2019-03-06: qty 1

## 2019-03-06 MED ORDER — INSULIN ASPART 100 UNIT/ML ~~LOC~~ SOLN
0.0000 [IU] | Freq: Every day | SUBCUTANEOUS | Status: DC
  Administered 2019-03-07: 2 [IU] via SUBCUTANEOUS
  Filled 2019-03-06: qty 1

## 2019-03-06 MED ORDER — DOXYCYCLINE HYCLATE 100 MG PO TABS
100.0000 mg | ORAL_TABLET | Freq: Two times a day (BID) | ORAL | Status: DC
  Administered 2019-03-07: 100 mg via ORAL
  Filled 2019-03-06: qty 1

## 2019-03-06 NOTE — H&P (Signed)
History and Physical   Jesse [name]:[id number] DOB: 06-30-1970 DOA: 03/06/2019  Referring MD/NP/PA: Dr. Derrill Kay  PCP: Leanna Sato, MD   Outpatient Specialists: Cardiology  Patient coming from: Home  Chief Complaint: Rectal pain and abscess  HPI: Jesse [name] is a 49 y.o. male with medical history significant of diabetes, non-insulin-dependent, coronary artery disease status post stents, hypertension, hyperlipidemia, morbid obesity and obstructive sleep apnea who apparently noted a boil around his gluteal cleft a few days ago.  Patient called his primary care physician where he had a televisit and initiated on doxycycline.  He is taking it for 4 days now but the lesion has gotten progressively worse, had fever and chills.  Also very painful and swollen.  Patient unable to sit down on his buttocks due to significant pain.  He was seen in the ER and evaluated.  Patient found to have perirectal abscess on the scan.  Incision and drainage done in the ER and surgery consulted.  Abscess had been drained and sent to the lab.  Patient being admitted to the hospital for management of his perirectal abscess.  He has had stable coronary artery disease.  His blood sugar has been poorly controlled.  Patient also denied any sick contact.  He has been negative for COVID-19.  Patient also has no prior MRSA infection.  At this point he would be admitted for IV antibiotics and continued care.  ED Course: Temperature 99 blood pressure 150/77 pulse 150 respirate of 18 oxygen sats 99% on room air.  Lactic acid 1.1 COVID-19 is negative CT abdomen pelvis showed abscess and possible phlegmon around the left gluteal cleft with extension into the ischio anal fossa and anteriorly with overall cellulitis in the gluteal region.  There is segmental thickening of the sigmoid colon with acute pericolonic or diverticular inflammation.  White control 0.9 and glucose 204 otherwise CBC and chemistry all are within normal.   Patient being admitted for IV antibiotics  Review of Systems: As per HPI otherwise 10 point review of systems negative.    Past Medical History:  Diagnosis Date  . Coronary artery disease    Inferior myocardial infarction in 2004.  He was treated with thrombolytics at [facility].  He then had an inferior MI in March 2007.  He received a Horizon study stent 3.5 x 20 mm in the distal RCA at [facility].  No other obstructive disease.  . Diabetes mellitus    TYPE II  . Erectile dysfunction   . Hyperlipidemia   . Hypertension   . MI (myocardial infarction) ([facility]) 3710,6269  . Morbid obesity ([facility])   . Sleep apnea     Past Surgical History:  Procedure Laterality Date  . CARDIAC CATHETERIZATION     @ [facility]  . LEFT HEART CATH AND CORONARY ANGIOGRAPHY N/A 07/05/2016   Procedure: Left Heart Cath and Coronary Angiography;  Surgeon: Alwyn Pea, MD;  Location: [facility] INVASIVE [facility];  Service: Cardiovascular;  Laterality: N/A;  . SEPTOPLASTY       reports that he has been smoking cigars. He has a 30.00 pack-year smoking history. He has never used smokeless tobacco. He reports that he does not drink alcohol or use drugs.  No Known Allergies  Family History  Adopted: Yes     Prior to Admission medications   Medication Sig Start Date End Date Taking? Authorizing Provider  atorvastatin (LIPITOR) 40 MG tablet Take 80 mg by mouth daily.    Yes [provider]  clopidogrel (PLAVIX) 75 MG tablet Take 1 tablet (75 mg total) by mouth daily. 07/06/16  Yes Gladstone Lighter, MD  doxycycline (VIBRA-TABS) 100 MG tablet Take 100 mg by mouth 2 (two) times daily. 03/02/19  Yes [provider]  Dulaglutide (TRULICITY) 1.28 NO/6.7EH SOPN Inject 0.75 mg into the skin once a week.   Yes [provider]  lisinopril (PRINIVIL,ZESTRIL) 10 MG tablet Take 1 tablet (10 mg total) by mouth daily. 07/07/16  Yes Gladstone Lighter, MD  metFORMIN (GLUCOPHAGE) 1000 MG tablet Take 1 tablet (1,000 mg  total) by mouth 2 (two) times daily with a meal. 07/06/16  Yes Gladstone Lighter, MD  metoprolol tartrate (LOPRESSOR) 25 MG tablet Take 1 tablet (25 mg total) by mouth 2 (two) times daily. 07/06/16  Yes Gladstone Lighter, MD  TRADJENTA 5 MG TABS tablet Take 5 mg by mouth daily. 02/22/17  Yes [provider]  traMADol (ULTRAM) 50 MG tablet Take 50 mg by mouth every 6 (six) hours as needed for pain. 02/13/17  Yes [provider]  aspirin 81 MG EC tablet Take 1 tablet (81 mg total) by mouth daily. Patient not taking: Reported on 03/06/2019 07/06/16   Gladstone Lighter, MD  isosorbide mononitrate (IMDUR) 30 MG 24 hr tablet Take 1 tablet (30 mg total) by mouth daily. Patient not taking: Reported on 03/06/2019 03/01/17   Bettey Costa, MD  nitroGLYCERIN (NITROSTAT) 0.4 MG SL tablet Place 1 tablet (0.4 mg total) under the tongue every 5 (five) minutes as needed for chest pain. 02/28/17   Bettey Costa, MD    Physical Exam: Vitals:   03/06/19 1611 03/06/19 2034 03/06/19 2242 03/06/19 2302  BP: (!) 144/96 138/70 136/79   Pulse: (!) 115 96 100   Resp: 16 16 16    Temp: 97.8 F (36.6 C)   99 F (37.2 C)  TempSrc: Oral   Oral  SpO2: 99% 95% 97%   Weight: 117.9 kg     Height: 5\' 11"  (1.803 m)         Constitutional: Acutely ill looking no acute distress Vitals:   03/06/19 1611 03/06/19 2034 03/06/19 2242 03/06/19 2302  BP: (!) 144/96 138/70 136/79   Pulse: (!) 115 96 100   Resp: 16 16 16    Temp: 97.8 F (36.6 C)   99 F (37.2 C)  TempSrc: Oral   Oral  SpO2: 99% 95% 97%   Weight: 117.9 kg     Height: 5\' 11"  (1.803 m)      Eyes: PERRL, lids and conjunctivae normal ENMT: Mucous membranes are moist. Posterior pharynx clear of any exudate or lesions.Normal dentition.  Neck: normal, supple, no masses, no thyromegaly Respiratory: clear to auscultation bilaterally, no wheezing, no crackles. Normal respiratory effort. No accessory muscle use.  Cardiovascular: Sinus tachycardia, no murmurs  / rubs / gallops. No extremity edema. 2+ pedal pulses. No carotid bruits.  Abdomen: no tenderness, no masses palpated. No hepatosplenomegaly. Bowel sounds positive.  Musculoskeletal: no clubbing / cyanosis. No joint deformity upper and lower extremities. Good ROM, no contractures. Normal muscle tone.  Skin: Extensive cellulitis of the gluteal area, induration, status post I&D with packing in place Neurologic: CN 2-12 grossly intact. Sensation intact, DTR normal. Strength 5/5 in all 4.  Psychiatric: Normal judgment and insight. Alert and oriented x 3. Normal mood.     Labs on Admission: I have personally reviewed following labs and imaging studies  CBC: Recent Labs  Lab 03/06/19 1830  WBC 12.9*  NEUTROABS 10.2*  HGB 16.0  HCT 47.8  MCV 83.6  PLT 376   Basic Metabolic Panel: Recent Labs  Lab 03/06/19 1830  NA 135  K 3.7  CL 96*  CO2 25  GLUCOSE 204*  BUN 17  CREATININE 0.71  CALCIUM 9.1   GFR: Estimated Creatinine Clearance: 147.4 mL/min (by C-G formula based on SCr of 0.71 mg/dL). Liver Function Tests: No results for input(s): AST, ALT, ALKPHOS, BILITOT, PROT, ALBUMIN in the last 168 hours. No results for input(s): LIPASE, AMYLASE in the last 168 hours. No results for input(s): AMMONIA in the last 168 hours. Coagulation Profile: No results for input(s): INR, PROTIME in the last 168 hours. Cardiac Enzymes: No results for input(s): CKTOTAL, CKMB, CKMBINDEX, TROPONINI in the last 168 hours. BNP (last 3 results) No results for input(s): PROBNP in the last 8760 hours. HbA1C: No results for input(s): HGBA1C in the last 72 hours. CBG: No results for input(s): GLUCAP in the last 168 hours. Lipid Profile: No results for input(s): CHOL, HDL, LDLCALC, TRIG, CHOLHDL, LDLDIRECT in the last 72 hours. Thyroid Function Tests: No results for input(s): TSH, T4TOTAL, FREET4, T3FREE, THYROIDAB in the last 72 hours. Anemia Panel: No results for input(s): VITAMINB12, FOLATE,  FERRITIN, TIBC, IRON, RETICCTPCT in the last 72 hours. Urine analysis:    Component Value Date/Time   COLORURINE STRAW (A) 10/11/2014 1009   APPEARANCEUR CLEAR (A) 10/11/2014 1009   LABSPEC 1.029 10/11/2014 1009   PHURINE 7.0 10/11/2014 1009   GLUCOSEU >500 (A) 10/11/2014 1009   HGBUR NEGATIVE 10/11/2014 1009   BILIRUBINUR NEGATIVE 10/11/2014 1009   KETONESUR NEGATIVE 10/11/2014 1009   PROTEINUR NEGATIVE 10/11/2014 1009   NITRITE NEGATIVE 10/11/2014 1009   LEUKOCYTESUR NEGATIVE 10/11/2014 1009   Sepsis Labs: @LABRCNTIP (procalcitonin:4,lacticidven:4) )No results found for this or any previous visit (from the past 240 hour(s)).   Radiological Exams on Admission: CT PELVIS W CONTRAST  Result Date: 03/06/2019 CLINICAL DATA:  Right buttock abscess, no drainage EXAM: CT PELVIS WITH CONTRAST TECHNIQUE: Multidetector CT imaging of the pelvis was performed using the standard protocol following the bolus administration of intravenous contrast. CONTRAST:  03/08/2019 OMNIPAQUE IOHEXOL 300 MG/ML  SOLN COMPARISON:  CT abdomen 11/23/2009 FINDINGS: Urinary Tract: Inferior portions of the kidneys are unremarkable. Included ureters free of acute abnormality. No bladder wall thickening, debris or calculi. Bowel: No small bowel dilatation or wall thickening. There are scattered colonic diverticula without acute diverticular inflammation to suggest active diverticulitis. A segment of the distal sigmoid colon appears circumferentially thickened which is overall similar to comparison exam and may reflect sequela of chronic inflammation. Mild distal rectal thickening with stranding in the intersphincteric plane, detailed further below. Vascular/Lymphatic: Atherosclerotic plaque within the normal caliber aorta. No suspicious or enlarged lymph nodes in the included lymphatic chains. Reproductive:  The prostate and seminal vesicles are unremarkable. Other: There is mild distal rectal thickening with hazy stranding in the  intersphincteric plane. Phlegmonous change extends beyond the level of the levator plate extending posteriorly into the ischioanal fossa and along the right gluteal cleft with ill-defined phlegmon and possible fluid collection along the anti inferomedial gluteal cleft measuring up to 9.3 x 4.6 x 3.4 cm with overlying skin thickening and features of cellulitis. Inflammation and phlegmonous change extends anteriorly as well across the peroneal soft tissues to the base of the scrotum. No evidence of soft tissue gas. Inflammatory features are limited predominantly to the right of midline. No bowel containing hernias. No free air or fluid in the imaged pelvis. Musculoskeletal: No acute osseous  abnormality or suspicious osseous lesion. IMPRESSION: Inflammation, phlegmon and possible abscess along the right medial gluteal cleft with extension into the ischioanal fossa and anteriorly with likely transsphincteric given thickening of the levator plate, intersphincteric fat stranding, and rectal wall thickening. A fistulous connection is not clearly discernible within the resolution of CT imaging. Overlying cellulitis and associated inflammatory features extend across the right medial gluteal cleft and anteriorly to the base of the scrotum. No soft tissue gas is seen. Segmental thickening of the sigmoid colon without acute pericolonic or diverticular inflammation. Such findings can be seen as sequela of chronic inflammation/prior episodes of diverticulitis however correlation with outpatient colonoscopy should be considered if not performed recently to exclude underlying mass lesions. Electronically Signed   By: Kreg Shropshire M.D.   On: 03/06/2019 20:05      Assessment/Plan Principal Problem:   Perirectal abscess Active Problems:   Hyperlipemia   Obese   Essential hypertension   CAD, NATIVE VESSEL   Ischemic cardiomyopathy   Poorly controlled diabetes mellitus ([facility])     #1 perirectal abscess: Extensive  abscess.  Status post initial I&D.  Admit the patient initiate IV vancomycin and cefepime.  Await cultures from I&D.  Surgical consultation.  Continue with all supportive care.  #2 coronary artery disease: Stable with no symptoms.  Continue monitor  #3 hypertension: Continue blood pressure control.  #4 diabetes: Poorly controlled.  Check hemoglobin A1c.  Hold Metformin and initiate sliding scale insulin.  Long-acting insulin to be added in the setting of infection.  #5 hyperlipidemia: Continue with statin   DVT prophylaxis: Lovenox Code Status: Full code Family Communication: Wife over the phone Disposition Plan: Home Consults called: General surgery Dr. Aleen Campi Admission status: Inpatient  Severity of Illness: The appropriate patient status for this patient is INPATIENT. Inpatient status is judged to be reasonable and necessary in order to provide the required intensity of service to ensure the patient's safety. The patient's presenting symptoms, physical exam findings, and initial radiographic and laboratory data in the context of their chronic comorbidities is felt to place them at high risk for further clinical deterioration. Furthermore, it is not anticipated that the patient will be medically stable for discharge from the hospital within 2 midnights of admission. The following factors support the patient status of inpatient.   " The patient's presenting symptoms include perirectal pain and swelling. " The worrisome physical exam findings include tenderness and evidence of abscess in the gluteal area. " The initial radiographic and laboratory data are worrisome because of CT pelvis showing significant abscess. " The chronic co-morbidities include diabetes with coronary artery disease.   * I certify that at the point of admission it is my clinical judgment that the patient will require inpatient hospital care spanning beyond 2 midnights from the point of admission due to high intensity  of service, high risk for further deterioration and high frequency of surveillance required.Lonia Blood MD [facility] Pager [phone]  If 7PM-7AM, please contact night-coverage [email or web address] Password [email or web address]  03/06/2019, 11:09 PM

## 2019-03-06 NOTE — ED Notes (Signed)
Lidocaine pulled and handed to ED provider

## 2019-03-06 NOTE — ED Provider Notes (Addendum)
[facility] Emergency Department Provider Note ____________________________________________  Time seen: 1758  I have reviewed the triage vital signs and the nursing notes.  HISTORY  Chief Complaint  Abscess  HPI Jesse [name] is a 49 y.o. male presents himself to the ED for evaluation of acute pain to the right buttocks.  Patient dropped about a week ago he noted an area to his buttock, that appears at as an area of scaling peeling skin.  He was evaluated via telemedicine visit by his primary care provider.  They started the patient on doxycycline.  He describes in the interim he has  experience fevers with a T-max of 103 F, chills, and malaise.  He presents today due to increasing pain and swelling to the right buttocks.  He denies any nausea, vomiting, or dizziness.  He denies any history of previous buttocks or cutaneous abscesses.  He reports a negative rapid Covid test at the advice of his primary care provider, 2 days prior.  Past Medical History:  Diagnosis Date  . Coronary artery disease    Inferior myocardial infarction in 2004.  He was treated with thrombolytics at [facility].  He then had an inferior MI in March 2007.  He received a Horizon study stent 3.5 x 20 mm in the distal RCA at [facility].  No other obstructive disease.  . Diabetes mellitus    TYPE II  . Erectile dysfunction   . Hyperlipidemia   . Hypertension   . MI (myocardial infarction) (HCC) 0347,4259  . Morbid obesity (HCC)   . Sleep apnea     Patient Active Problem List   Diagnosis Date Noted  . NSTEMI (non-ST elevated myocardial infarction) (HCC) 02/28/2017  . STEMI involving right coronary artery (HCC) 07/05/2016  . Ischemic cardiomyopathy 07/05/2016  . Polysubstance abuse (HCC) 07/05/2016  . H/O noncompliance with medical treatment, presenting hazards to health 07/05/2016  . Poorly controlled diabetes mellitus (HCC) 07/05/2016  . DYSPNEA 09/28/2008  . HYPERLIPIDEMIA-MIXED  08/13/2008  . Obese 08/13/2008  . Essential hypertension 08/13/2008  . CAD, NATIVE VESSEL 08/13/2008    Past Surgical History:  Procedure Laterality Date  . CARDIAC CATHETERIZATION     @ [facility]  . LEFT HEART CATH AND CORONARY ANGIOGRAPHY N/A 07/05/2016   Procedure: Left Heart Cath and Coronary Angiography;  Surgeon: Alwyn Pea, MD;  Location: [facility] INVASIVE CV LAB;  Service: Cardiovascular;  Laterality: N/A;  . SEPTOPLASTY      Prior to Admission medications   Medication Sig Start Date End Date Taking? Authorizing Provider  aspirin 81 MG EC tablet Take 1 tablet (81 mg total) by mouth daily. 07/06/16   Enid Baas, MD  atorvastatin (LIPITOR) 40 MG tablet Take 40 mg by mouth daily.    [provider]  clopidogrel (PLAVIX) 75 MG tablet Take 1 tablet (75 mg total) by mouth daily. 07/06/16   Enid Baas, MD  Dulaglutide (TRULICITY) 0.75 MG/0.5ML SOPN Inject 0.75 mg into the skin once a week.    [provider]  isosorbide mononitrate (IMDUR) 30 MG 24 hr tablet Take 1 tablet (30 mg total) by mouth daily. 03/01/17   Adrian Saran, MD  lisinopril (PRINIVIL,ZESTRIL) 10 MG tablet Take 1 tablet (10 mg total) by mouth daily. 07/07/16   Enid Baas, MD  metFORMIN (GLUCOPHAGE) 1000 MG tablet Take 1 tablet (1,000 mg total) by mouth 2 (two) times daily with a meal. 07/06/16   Enid Baas, MD  metoprolol tartrate (LOPRESSOR) 25 MG tablet Take 1 tablet (  25 mg total) by mouth 2 (two) times daily. 07/06/16   Enid Baas, MD  nitroGLYCERIN (NITROSTAT) 0.4 MG SL tablet Place 1 tablet (0.4 mg total) under the tongue every 5 (five) minutes as needed for chest pain. 02/28/17   Adrian Saran, MD  TRADJENTA 5 MG TABS tablet Take 5 mg by mouth daily. 02/22/17   [provider]  traMADol (ULTRAM) 50 MG tablet Take 50 mg by mouth every 6 (six) hours as needed for pain. 02/13/17   [provider]    Allergies Patient has no known allergies.  Family History   Adopted: Yes    Social History Social History   Tobacco Use  . Smoking status: Current Every Day Smoker    Packs/day: 2.00    Years: 15.00    Pack years: 30.00    Types: Cigars  . Smokeless tobacco: Never Used  Substance Use Topics  . Alcohol use: No  . Drug use: No    Comment: prior hx of cocaine and marijuana    Review of Systems  Constitutional: Positive for fevers and chills. ardiovascular: Negative for chest pain. Respiratory: Negative for shortness of breath. Gastrointestinal: Negative for abdominal pain, vomiting and diarrhea. Genitourinary: Negative for dysuria. Musculoskeletal: Negative for back pain. Skin: Negative for rash. Right buttock abscess.  Neurological: Negative for headaches, focal weakness or numbness. ____________________________________________  PHYSICAL EXAM:  VITAL SIGNS: ED Triage Vitals [03/06/19 1611]  Enc Vitals Group     BP (!) 144/96     Pulse Rate (!) 115     Resp 16     Temp 97.8 F (36.6 C)     Temp Source Oral     SpO2 99 %     Weight 260 lb (117.9 kg)     Height 5\' 11"  (1.803 m)     Head Circumference      Peak Flow      Pain Score 10     Pain Loc      Pain Edu?      Excl. in GC?     Constitutional: Alert and oriented. Well appearing and in no distress. Head: Normocephalic and atraumatic. Eyes: Conjunctivae are normal. Normal extraocular movements Cardiovascular: Normal rate, regular rhythm. Normal distal pulses. Respiratory: Normal respiratory effort. No wheezes/rales/rhonchi. Gastrointestinal: Soft and nontender. No distention. Normal bowel sounds. Musculoskeletal: Nontender with normal range of motion in all extremities.  Neurologic:  Normal gait without ataxia. Normal speech and language. No gross focal neurologic deficits are appreciated. Skin:  Skin is warm, dry and intact. No rash noted.  Patient with a large rounded fluctuant area of erythema to the right buttocks.  The area is present to the midportion of the  right buttocks and extends towards the rectum.  There is also extension towards the scrotum.  No appreciable punctum or spontaneous drainage is noted.  There is surrounding erythema and induration. ____________________________________________   LABS (pertinent positives/negatives) Labs Reviewed  BASIC METABOLIC PANEL  CBC WITH DIFFERENTIAL/PLATELET  ____________________________________________   RADIOLOGY  CT Pelvis w/ CM  Pending ____________________________________________  PROCEDURES  Morphine 4 mg IVP Zofran 4 mg IVP Procedures ____________________________________________  INITIAL IMPRESSION / ASSESSMENT AND PLAN / ED COURSE  Patient with ED evaluation of a 1 week complaint of increasing pain tenderness to the right buttocks.  Patient is currently being treated with doxycycline for a buttocks abscess.  He presents now for increasing pain and swelling.  CT scan and base labs are pending at the time of this note.  Patient's clinical picture is consistent with a focal abscess but there is concern for septicemia given his history of fevers, current tachycardia, and suspected leukocytosis.  CT scan will determine further intervention whether local I&D procedure versus a surgical incision and drainage is warranted.  Patient care and disposition is transferred to my colleague A. Earleen Newport, PA-C.  Jesse [name] was evaluated in Emergency Department on 03/06/2019 for the symptoms described in the history of present illness. He was evaluated in the context of the global COVID-19 pandemic, which necessitated consideration that the patient might be at risk for infection with the SARS-CoV-2 virus that causes COVID-19. Institutional protocols and algorithms that pertain to the evaluation of patients at risk for COVID-19 are in a state of rapid change based on information released by regulatory bodies including the CDC and federal and state organizations. These policies and algorithms were followed  during the patient's care in the ED. ____________________________________________  FINAL CLINICAL IMPRESSION(S) / ED DIAGNOSES  Final diagnoses:  Abscess of buttock, right      Gilma Bessette, Dannielle Karvonen, PA-C 03/06/19 1838    Melvenia Needles, PA-C 03/06/19 1839    Arta Silence, MD 03/06/19 2208

## 2019-03-06 NOTE — ED Triage Notes (Signed)
Pt c/o abscess on right buttocks X1week. Denies drainage.

## 2019-03-06 NOTE — ED Provider Notes (Signed)
[facility] Emergency Department Provider Note  ____________________________________________  Time seen: Approximately 10:21 PM  I have reviewed the triage vital signs and the nursing notes.   HISTORY  Chief Complaint Abscess    HPI Jesse [name] is a 49 y.o. male with past medical history of uncontrolled diabetes, hypertension, CAD, MI that presents to the emergency department for evaluation of perirectal abscess.  Patient had a fever last weekend, up to 103.  He has had no fevers today.  He had a virtual visit with primary care this week and was started on doxycycline.  Patient states that abscess has continued to grow so he came to the emergency department. He feels as if he is sitting on a baseball.   Past Medical History:  Diagnosis Date  . Coronary artery disease    Inferior myocardial infarction in 2004.  He was treated with thrombolytics at [facility].  He then had an inferior MI in March 2007.  He received a Horizon study stent 3.5 x 20 mm in the distal RCA at [facility].  No other obstructive disease.  . Diabetes mellitus    TYPE II  . Erectile dysfunction   . Hyperlipidemia   . Hypertension   . MI (myocardial infarction) (HCC) 4503,8882  . Morbid obesity (HCC)   . Sleep apnea     Patient Active Problem List   Diagnosis Date Noted  . NSTEMI (non-ST elevated myocardial infarction) (HCC) 02/28/2017  . STEMI involving right coronary artery (HCC) 07/05/2016  . Ischemic cardiomyopathy 07/05/2016  . Polysubstance abuse (HCC) 07/05/2016  . H/O noncompliance with medical treatment, presenting hazards to health 07/05/2016  . Poorly controlled diabetes mellitus (HCC) 07/05/2016  . DYSPNEA 09/28/2008  . HYPERLIPIDEMIA-MIXED 08/13/2008  . Obese 08/13/2008  . Essential hypertension 08/13/2008  . CAD, NATIVE VESSEL 08/13/2008    Past Surgical History:  Procedure Laterality Date  . CARDIAC CATHETERIZATION     @ [facility]  . LEFT HEART CATH AND CORONARY  ANGIOGRAPHY N/A 07/05/2016   Procedure: Left Heart Cath and Coronary Angiography;  Surgeon: Alwyn Pea, MD;  Location: [facility] INVASIVE CV LAB;  Service: Cardiovascular;  Laterality: N/A;  . SEPTOPLASTY      Prior to Admission medications   Medication Sig Start Date End Date Taking? Authorizing Provider  aspirin 81 MG EC tablet Take 1 tablet (81 mg total) by mouth daily. 07/06/16   Enid Baas, MD  atorvastatin (LIPITOR) 40 MG tablet Take 40 mg by mouth daily.    [provider]  clopidogrel (PLAVIX) 75 MG tablet Take 1 tablet (75 mg total) by mouth daily. 07/06/16   Enid Baas, MD  doxycycline (VIBRA-TABS) 100 MG tablet Take 100 mg by mouth 2 (two) times daily. 03/02/19   [provider]  Dulaglutide (TRULICITY) 0.75 MG/0.5ML SOPN Inject 0.75 mg into the skin once a week.    [provider]  isosorbide mononitrate (IMDUR) 30 MG 24 hr tablet Take 1 tablet (30 mg total) by mouth daily. 03/01/17   Adrian Saran, MD  lisinopril (PRINIVIL,ZESTRIL) 10 MG tablet Take 1 tablet (10 mg total) by mouth daily. 07/07/16   Enid Baas, MD  metFORMIN (GLUCOPHAGE) 1000 MG tablet Take 1 tablet (1,000 mg total) by mouth 2 (two) times daily with a meal. 07/06/16   Enid Baas, MD  metoprolol tartrate (LOPRESSOR) 25 MG tablet Take 1 tablet (25 mg total) by mouth 2 (two) times daily. 07/06/16   Enid Baas, MD  nitroGLYCERIN (NITROSTAT) 0.4 MG SL tablet  Place 1 tablet (0.4 mg total) under the tongue every 5 (five) minutes as needed for chest pain. 02/28/17   Adrian Saran, MD  TRADJENTA 5 MG TABS tablet Take 5 mg by mouth daily. 02/22/17   [provider]  traMADol (ULTRAM) 50 MG tablet Take 50 mg by mouth every 6 (six) hours as needed for pain. 02/13/17   [provider]    Allergies Patient has no known allergies.  Family History  Adopted: Yes    Social History Social History   Tobacco Use  . Smoking status: Current Every Day Smoker     Packs/day: 2.00    Years: 15.00    Pack years: 30.00    Types: Cigars  . Smokeless tobacco: Never Used  Substance Use Topics  . Alcohol use: No  . Drug use: No    Comment: prior hx of cocaine and marijuana     Review of Systems  Constitutional: No current fever/chills Cardiovascular: No chest pain. Respiratory: No SOB. Gastrointestinal: No abdominal pain.  No nausea, no vomiting.  Musculoskeletal: Negative for musculoskeletal pain. Skin: Negative for rash, abrasions, lacerations, ecchymosis. Neurological: Negative for headaches   ____________________________________________   PHYSICAL EXAM:  VITAL SIGNS: ED Triage Vitals [03/06/19 1611]  Enc Vitals Group     BP (!) 144/96     Pulse Rate (!) 115     Resp 16     Temp 97.8 F (36.6 C)     Temp Source Oral     SpO2 99 %     Weight 260 lb (117.9 kg)     Height 5\' 11"  (1.803 m)     Head Circumference      Peak Flow      Pain Score 10     Pain Loc      Pain Edu?      Excl. in GC?      Constitutional: Alert and oriented. Well appearing and in no acute distress. Eyes: Conjunctivae are normal. PERRL. EOMI. Head: Atraumatic. ENT:      Ears:      Nose: No congestion/rhinnorhea.      Mouth/Throat: Mucous membranes are moist.  Neck: No stridor.  Cardiovascular: Normal rate, regular rhythm.  Good peripheral circulation. Respiratory: Normal respiratory effort without tachypnea or retractions. Lungs CTAB. Good air entry to the bases with no decreased or absent breath sounds. Musculoskeletal: Full range of motion to all extremities. No gross deformities appreciated. Neurologic:  Normal speech and language. No gross focal neurologic deficits are appreciated.  Skin:  Skin is warm, dry and intact.  Area of swelling and fluctuance to right gluteal cleft with surrounding induration and cellulitis that extends to scrotum and rectum. Psychiatric: Mood and affect are normal. Speech and behavior are normal. Patient exhibits  appropriate insight and judgement.   ____________________________________________   LABS (all labs ordered are listed, but only abnormal results are displayed)  Labs Reviewed  BASIC METABOLIC PANEL - Abnormal; Notable for the following components:      Result Value   Chloride 96 (*)    Glucose, Bld 204 (*)    All other components within normal limits  CBC WITH DIFFERENTIAL/PLATELET - Abnormal; Notable for the following components:   WBC 12.9 (*)    Neutro Abs 10.2 (*)    Abs Immature Granulocytes 0.21 (*)    All other components within normal limits  SARS CORONAVIRUS 2 (TAT 6-24 HRS)  LACTIC ACID, PLASMA  LACTIC ACID, PLASMA   ____________________________________________  EKG  ____________________________________________  RADIOLOGY Robinette Haines, personally viewed and evaluated these images (plain radiographs) as part of my medical decision making, as well as reviewing the written report by the radiologist.  CT PELVIS W CONTRAST  Result Date: 03/06/2019 CLINICAL DATA:  Right buttock abscess, no drainage EXAM: CT PELVIS WITH CONTRAST TECHNIQUE: Multidetector CT imaging of the pelvis was performed using the standard protocol following the bolus administration of intravenous contrast. CONTRAST:  124mL OMNIPAQUE IOHEXOL 300 MG/ML  SOLN COMPARISON:  CT abdomen 11/23/2009 FINDINGS: Urinary Tract: Inferior portions of the kidneys are unremarkable. Included ureters free of acute abnormality. No bladder wall thickening, debris or calculi. Bowel: No small bowel dilatation or wall thickening. There are scattered colonic diverticula without acute diverticular inflammation to suggest active diverticulitis. A segment of the distal sigmoid colon appears circumferentially thickened which is overall similar to comparison exam and may reflect sequela of chronic inflammation. Mild distal rectal thickening with stranding in the intersphincteric plane, detailed further below. Vascular/Lymphatic:  Atherosclerotic plaque within the normal caliber aorta. No suspicious or enlarged lymph nodes in the included lymphatic chains. Reproductive:  The prostate and seminal vesicles are unremarkable. Other: There is mild distal rectal thickening with hazy stranding in the intersphincteric plane. Phlegmonous change extends beyond the level of the levator plate extending posteriorly into the ischioanal fossa and along the right gluteal cleft with ill-defined phlegmon and possible fluid collection along the anti inferomedial gluteal cleft measuring up to 9.3 x 4.6 x 3.4 cm with overlying skin thickening and features of cellulitis. Inflammation and phlegmonous change extends anteriorly as well across the peroneal soft tissues to the base of the scrotum. No evidence of soft tissue gas. Inflammatory features are limited predominantly to the right of midline. No bowel containing hernias. No free air or fluid in the imaged pelvis. Musculoskeletal: No acute osseous abnormality or suspicious osseous lesion. IMPRESSION: Inflammation, phlegmon and possible abscess along the right medial gluteal cleft with extension into the ischioanal fossa and anteriorly with likely transsphincteric given thickening of the levator plate, intersphincteric fat stranding, and rectal wall thickening. A fistulous connection is not clearly discernible within the resolution of CT imaging. Overlying cellulitis and associated inflammatory features extend across the right medial gluteal cleft and anteriorly to the base of the scrotum. No soft tissue gas is seen. Segmental thickening of the sigmoid colon without acute pericolonic or diverticular inflammation. Such findings can be seen as sequela of chronic inflammation/prior episodes of diverticulitis however correlation with outpatient colonoscopy should be considered if not performed recently to exclude underlying mass lesions. Electronically Signed   By: Lovena Le M.D.   On: 03/06/2019 20:05     ____________________________________________    PROCEDURES  Procedure(s) performed:    Procedures  INCISION AND DRAINAGE Performed by: Laban Emperor Consent: Verbal consent obtained. Risks and benefits: risks, benefits and alternatives were discussed Type: abscess  Body area: perirectal  Anesthesia: local infiltration  Incision was made with a scalpel.  Local anesthetic: lidocaine 1 % without epinephrine  Anesthetic total: 4 ml  Complexity: complex Blunt dissection to break up loculations  Drainage: purulent  Drainage amount: significant  Packing material: 1/4 in iodoform gauze  Patient tolerance: Patient tolerated the procedure well with no immediate complications.    Medications  ondansetron (ZOFRAN) injection 4 mg (4 mg Intravenous Not Given 03/06/19 1852)  lidocaine (PF) (XYLOCAINE) 1 % injection 5 mL (has no administration in time range)  traMADol (ULTRAM) tablet 50 mg (50 mg Oral Given 03/06/19 1855)  iohexol (  OMNIPAQUE) 300 MG/ML solution 100 mL (100 mLs Intravenous Contrast Given 03/06/19 1947)  sodium chloride 0.9 % bolus 500 mL (0 mLs Intravenous Stopped 03/06/19 2215)  piperacillin-tazobactam (ZOSYN) IVPB 3.375 g (0 g Intravenous Stopped 03/06/19 2059)     ____________________________________________   INITIAL IMPRESSION / ASSESSMENT AND PLAN / ED COURSE  Pertinent labs & imaging results that were available during my care of the patient were reviewed by me and considered in my medical decision making (see chart for details).  Review of the [place] CSRS was performed in accordance of the [facility] prior to dispensing any controlled drugs.   Patient presented to emergency department for evaluation of perirectal abscess.  Vital signs and exam are reassuring.  Patient has a mild leukocytosis of 12.9.  CT scan consistent with large area of cellulitis and possible abscess up to 9.3 x 4.6 x 3.4 cm.  Exam is consistent with large abscess.  Dr. Aleen Campi was  consulted in the emergency department and recommends bedside incision and drainage.  Abscess was drained in the emergency department with significant drainage.  Abscess was packed.  Patient will be admitted to the emergency department for perirectal abscess given the significant size of the abscess, as patient is an uncontrolled diabetic and has already trialed oral antibiotics at home.  Dr. Aleen Campi will follow up with the patient in hospital tomorrow.  Patient was given IV fluids and Zosyn for infection.  Dr. Mikeal Hawthorne will admit patient.  Jesse [name] was evaluated in Emergency Department on 03/06/2019 for the symptoms described in the history of present illness. He was evaluated in the context of the global COVID-19 pandemic, which necessitated consideration that the patient might be at risk for infection with the SARS-CoV-2 virus that causes COVID-19. Institutional protocols and algorithms that pertain to the evaluation of patients at risk for COVID-19 are in a state of rapid change based on information released by regulatory bodies including the CDC and federal and state organizations. These policies and algorithms were followed during the patient's care in the ED.     ____________________________________________  FINAL CLINICAL IMPRESSION(S) / ED DIAGNOSES  Final diagnoses:  Abscess of buttock, right  Perirectal abscess      NEW MEDICATIONS STARTED DURING THIS VISIT:  ED Discharge Orders    None          This chart was dictated using voice recognition software/Dragon. Despite best efforts to proofread, errors can occur which can change the meaning. Any change was purely unintentional.    Enid Derry, PA-C 03/06/19 2306    Phineas Semen, MD 03/06/19 [phone]

## 2019-03-06 NOTE — ED Notes (Signed)
hospitalist in to see pt.

## 2019-03-07 DIAGNOSIS — I10 Essential (primary) hypertension: Secondary | ICD-10-CM

## 2019-03-07 DIAGNOSIS — I255 Ischemic cardiomyopathy: Secondary | ICD-10-CM

## 2019-03-07 DIAGNOSIS — I25110 Atherosclerotic heart disease of native coronary artery with unstable angina pectoris: Secondary | ICD-10-CM

## 2019-03-07 LAB — COMPREHENSIVE METABOLIC PANEL
ALT: 29 U/L (ref 0–44)
AST: 24 U/L (ref 15–41)
Albumin: 2.7 g/dL — ABNORMAL LOW (ref 3.5–5.0)
Alkaline Phosphatase: 73 U/L (ref 38–126)
Anion gap: 10 (ref 5–15)
BUN: 12 mg/dL (ref 6–20)
CO2: 25 mmol/L (ref 22–32)
Calcium: 8.3 mg/dL — ABNORMAL LOW (ref 8.9–10.3)
Chloride: 99 mmol/L (ref 98–111)
Creatinine, Ser: 0.53 mg/dL — ABNORMAL LOW (ref 0.61–1.24)
GFR calc Af Amer: >60 mL/min (ref >60)
GFR calc non Af Amer: >60 mL/min (ref >60)
Glucose, Bld: 189 mg/dL — ABNORMAL HIGH (ref 70–99)
Potassium: 3.7 mmol/L (ref 3.5–5.1)
Sodium: 134 mmol/L — ABNORMAL LOW (ref 135–145)
Total Bilirubin: 0.7 mg/dL (ref 0.3–1.2)
Total Protein: 6.6 g/dL (ref 6.5–8.1)

## 2019-03-07 LAB — CBC
HCT: 41.6 % (ref 39.0–52.0)
Hemoglobin: 14 g/dL (ref 13.0–17.0)
MCH: 27.9 pg (ref 26.0–34.0)
MCHC: 33.7 g/dL (ref 30.0–36.0)
MCV: 83 fL (ref 80.0–100.0)
Platelets: 367 10*3/uL (ref 150–400)
RBC: 5.01 MIL/uL (ref 4.22–5.81)
RDW: 12.6 % (ref 11.5–15.5)
WBC: 10.9 10*3/uL — ABNORMAL HIGH (ref 4.0–10.5)
nRBC: 0 % (ref 0.0–0.2)

## 2019-03-07 LAB — GLUCOSE, CAPILLARY
Glucose-Capillary: 194 mg/dL — ABNORMAL HIGH (ref 70–99)
Glucose-Capillary: 168 mg/dL — ABNORMAL HIGH (ref 70–99)
Glucose-Capillary: 180 mg/dL — ABNORMAL HIGH (ref 70–99)
Glucose-Capillary: 205 mg/dL — ABNORMAL HIGH (ref 70–99)

## 2019-03-07 LAB — HEMOGLOBIN A1C
Hgb A1c MFr Bld: 11.5 % — ABNORMAL HIGH (ref 4.8–5.6)
Mean Plasma Glucose: 283.35 mg/dL

## 2019-03-07 LAB — SARS CORONAVIRUS 2 (TAT 6-24 HRS): SARS Coronavirus 2: NEGATIVE

## 2019-03-07 LAB — HIV ANTIBODY (ROUTINE TESTING W REFLEX): HIV Screen 4th Generation wRfx: NONREACTIVE

## 2019-03-07 MED ORDER — SODIUM CHLORIDE 0.9 % IV SOLN
1.0000 g | INTRAVENOUS | Status: DC
  Administered 2019-03-07 – 2019-03-08 (×2): 1 g via INTRAVENOUS
  Filled 2019-03-07: qty 10
  Filled 2019-03-07: qty 1

## 2019-03-07 MED ORDER — METRONIDAZOLE IN NACL 5-0.79 MG/ML-% IV SOLN
500.0000 mg | Freq: Three times a day (TID) | INTRAVENOUS | Status: DC
  Administered 2019-03-07 – 2019-03-08 (×4): 500 mg via INTRAVENOUS
  Filled 2019-03-07 (×7): qty 100

## 2019-03-07 MED ORDER — VANCOMYCIN HCL 1750 MG/350ML IV SOLN: 1750.0000 mg | Freq: Two times a day (BID) | INTRAVENOUS | Status: DC

## 2019-03-07 MED ORDER — VANCOMYCIN HCL 1500 MG/300ML IV SOLN
1500.0000 mg | Freq: Two times a day (BID) | INTRAVENOUS | Status: DC
  Administered 2019-03-07 – 2019-03-08 (×2): 1500 mg via INTRAVENOUS
  Filled 2019-03-07 (×4): qty 300

## 2019-03-07 MED ORDER — SODIUM CHLORIDE 0.9 % IV SOLN
INTRAVENOUS | Status: DC | PRN
  Administered 2019-03-07 (×2): 30 mL via INTRAVENOUS
  Administered 2019-03-07: 250 mL via INTRAVENOUS
  Administered 2019-03-07: 30 mL via INTRAVENOUS
  Administered 2019-03-07: 40 mL via INTRAVENOUS
  Administered 2019-03-07: 30 mL via INTRAVENOUS
  Administered 2019-03-08: 250 mL via INTRAVENOUS

## 2019-03-07 NOTE — Progress Notes (Signed)
PROGRESS NOTE    Jesse [name]  UXL:[id number] DOB: January 13, 1971 DOA: 03/06/2019 PCP: Jesse Sato, MD      Brief Narrative:  Jesse [name] is a 49 y.o. M with DM, CAD s/p PCI 2007, HTN, OSA and MO who presented with worsening rectal pain and swelling.  Had recently developed a boil around his right buttock, which became progressively worse.  He saw his PCP via televisit and started doxycycline, but this continued to worsen, and developed new fever and chills despite doxycycline, and he could not sit down so he came to the ER.  In the ER pulse 150, WBC 12 K, and CT abdomen and pelvis showed an abscess with phlegmon and extension of cellulitis in the gluteal region.  The lesion was I&D in the ER, general surgery were consulted, the patient was admitted for IV antibiotics for failed outpatient management of perirectal abscess.           Assessment & Plan:  Perirectal abscess SIRS, sepsis ruled out, no endorgan damage The patient failed outpatient therapy with doxycycline, and developed a large abscess with cellulitis.  This is now been I&D, so definitive source control has been obtained.  Cultures were not sent.  -Continue vancomycin -Change cefepime to ceftriaxone and Flagyl    Coronary disease secondary prevention Hypertension BP controlled -Continue atorvastatin, Plavix, lisinopril, metoprolol  Sleep apnea Morbid obesity  Diabetes Glucose is well controlled.  A1c 11.5%. -Continue linagliptin -Continue sliding scale corrections -Hold Trulicity and metformin       Disposition: The patient was admitted with perirectal abscess that failed outpatient therapy.  He underwent I&D in the ER, we will continue IV antibiotics, likely home tomorrow to complete the course of oral antibiotics and follow-up with general surgery as an outpatient.         MDM: The below labs and imaging reports were reviewed and summarized above.  Medication management as  above.   DVT prophylaxis: Lovenox Code Status: Full code Family Communication: Wife by phone    Consultants:   General surgery  Procedures:   2/13 bedside I&D  Antimicrobials:   Doxycycline, cefepime, Zosyn x1 2/13  Vancomycin 2/13>>  Ceftriaxone, Flagyl 2/14>>  Culture data:   None obtained -- called to micro lab          Subjective: No fever overnight.  Right buttock swelling is completely resolved.  No vomiting, confusion, chest pain, abdominal pain.  Objective: Vitals:   03/06/19 2325 03/07/19 0429 03/07/19 0803 03/07/19 1245  BP: (!) 151/77 123/82 115/61 121/79  Pulse: 100 86 83 86  Resp: 18 20 16    Temp: 98.6 F (37 C) 98 F (36.7 C) 97.8 F (36.6 C) 97.9 F (36.6 C)  TempSrc: Oral Oral Oral Oral  SpO2: 98% 95% 97% 97%  Weight: 118.6 kg     Height: 5\' 11"  (1.803 m)       Intake/Output Summary (Last 24 hours) at 03/07/2019 1312 Last data filed at 03/07/2019 0547 Gross per 24 hour  Intake 1139.04 ml  Output 600 ml  Net 539.04 ml   Filed Weights   03/06/19 1611 03/06/19 2325  Weight: 117.9 kg 118.6 kg    Examination: General appearance:  adult male, alert and in no acute distress.   HEENT: Anicteric, conjunctiva pink, lids and lashes normal. No nasal deformity, discharge, epistaxis.  Lips moist, dentition normal, oropharynx moist, no oral lesions, hearing normal.   Skin: Warm and dry.  No jaundice.  No suspicious rashes or lesions.  Redness and swelling in the right buttock is improved.  There is expected drainage around the packed right buttock I&D site. Cardiac: RRR, nl S1-S2, no murmurs appreciated.  Capillary refill is brisk.  JVP normal.  No LE edema.  Radial pulses 2+ and symmetric. Respiratory: Normal respiratory rate and rhythm.  CTAB without rales or wheezes. Abdomen: Abdomen soft.  No TTP. No ascites, distension, hepatosplenomegaly.   MSK: No deformities or effusions. Neuro: Awake and alert.  EOMI, moves all extremities. Speech  fluent.    Psych: Sensorium intact and responding to questions, attention normal. Affect normal.  Judgment and insight appear normal.    Data Reviewed: I have personally reviewed following labs and imaging studies:  CBC: Recent Labs  Lab 03/06/19 1830 03/07/19 0431  WBC 12.9* 10.9*  NEUTROABS 10.2*  --   HGB 16.0 14.0  HCT 47.8 41.6  MCV 83.6 83.0  PLT 376 367   Basic Metabolic Panel: Recent Labs  Lab 03/06/19 1830 03/07/19 0431  NA 135 134*  K 3.7 3.7  CL 96* 99  CO2 25 25  GLUCOSE 204* 189*  BUN 17 12  CREATININE 0.71 0.53*  CALCIUM 9.1 8.3*   GFR: Estimated Creatinine Clearance: 147.9 mL/min (A) (by C-G formula based on SCr of 0.53 mg/dL (L)). Liver Function Tests: Recent Labs  Lab 03/07/19 0431  AST 24  ALT 29  ALKPHOS 73  BILITOT 0.7  PROT 6.6  ALBUMIN 2.7*   No results for input(s): LIPASE, AMYLASE in the last 168 hours. No results for input(s): AMMONIA in the last 168 hours. Coagulation Profile: No results for input(s): INR, PROTIME in the last 168 hours. Cardiac Enzymes: No results for input(s): CKTOTAL, CKMB, CKMBINDEX, TROPONINI in the last 168 hours. BNP (last 3 results) No results for input(s): PROBNP in the last 8760 hours. HbA1C: Recent Labs    03/06/19 1830  HGBA1C 11.5*   CBG: Recent Labs  Lab 03/06/19 2327 03/07/19 0746 03/07/19 1157  GLUCAP 157* 194* 168*   Lipid Profile: No results for input(s): CHOL, HDL, LDLCALC, TRIG, CHOLHDL, LDLDIRECT in the last 72 hours. Thyroid Function Tests: No results for input(s): TSH, T4TOTAL, FREET4, T3FREE, THYROIDAB in the last 72 hours. Anemia Panel: No results for input(s): VITAMINB12, FOLATE, FERRITIN, TIBC, IRON, RETICCTPCT in the last 72 hours. Urine analysis:    Component Value Date/Time   COLORURINE STRAW (A) 10/11/2014 1009   APPEARANCEUR CLEAR (A) 10/11/2014 1009   LABSPEC 1.029 10/11/2014 1009   PHURINE 7.0 10/11/2014 1009   GLUCOSEU >500 (A) 10/11/2014 1009   HGBUR NEGATIVE  10/11/2014 1009   BILIRUBINUR NEGATIVE 10/11/2014 1009   KETONESUR NEGATIVE 10/11/2014 1009   PROTEINUR NEGATIVE 10/11/2014 1009   NITRITE NEGATIVE 10/11/2014 1009   LEUKOCYTESUR NEGATIVE 10/11/2014 1009   Sepsis Labs: @LABRCNTIP (procalcitonin:4,lacticacidven:4)  ) Recent Results (from the past 240 hour(s))  SARS CORONAVIRUS 2 (TAT 6-24 HRS) Nasopharyngeal Nasopharyngeal Swab     Status: None   Collection Time: 03/06/19 10:15 PM   Specimen: Nasopharyngeal Swab  Result Value Ref Range Status   SARS Coronavirus 2 NEGATIVE NEGATIVE Final    Comment: (NOTE) SARS-CoV-2 target nucleic acids are NOT DETECTED. The SARS-CoV-2 RNA is generally detectable in upper and lower respiratory specimens during the acute phase of infection. Negative results do not preclude SARS-CoV-2 infection, do not rule out co-infections with other pathogens, and should not be used as the sole basis for treatment or other patient management decisions. Negative results must be combined with clinical observations, patient history,  and epidemiological information. The expected result is Negative. Fact Sheet for Patients: HairSlick.no Fact Sheet for Healthcare Providers: quierodirigir.com This test is not yet approved or cleared by the [place] FDA and  has been authorized for detection and/or diagnosis of SARS-CoV-2 by FDA under an Emergency Use Authorization (EUA). This EUA will remain  in effect (meaning this test can be used) for the duration of the COVID-19 declaration under Section 56 4(b)(1) of the Act, 21 U.S.C. section 360bbb-3(b)(1), unless the authorization is terminated or revoked sooner. Performed at [facility], [address], [place], [place] [address]          Radiology Studies: CT PELVIS W CONTRAST  Result Date: 03/06/2019 CLINICAL DATA:  Right buttock abscess, no drainage EXAM: CT PELVIS WITH CONTRAST TECHNIQUE:  Multidetector CT imaging of the pelvis was performed using the standard protocol following the bolus administration of intravenous contrast. CONTRAST:  OMNIPAQUE IOHEXOL 300 MG/ML  SOLN COMPARISON:  CT abdomen 11/23/2009 FINDINGS: Urinary Tract: Inferior portions of the kidneys are unremarkable. Included ureters free of acute abnormality. No bladder wall thickening, debris or calculi. Bowel: No small bowel dilatation or wall thickening. There are scattered colonic diverticula without acute diverticular inflammation to suggest active diverticulitis. A segment of the distal sigmoid colon appears circumferentially thickened which is overall similar to comparison exam and may reflect sequela of chronic inflammation. Mild distal rectal thickening with stranding in the intersphincteric plane, detailed further below. Vascular/Lymphatic: Atherosclerotic plaque within the normal caliber aorta. No suspicious or enlarged lymph nodes in the included lymphatic chains. Reproductive:  The prostate and seminal vesicles are unremarkable. Other: There is mild distal rectal thickening with hazy stranding in the intersphincteric plane. Phlegmonous change extends beyond the level of the levator plate extending posteriorly into the ischioanal fossa and along the right gluteal cleft with ill-defined phlegmon and possible fluid collection along the anti inferomedial gluteal cleft measuring up to 9.3 x 4.6 x 3.4 cm with overlying skin thickening and features of cellulitis. Inflammation and phlegmonous change extends anteriorly as well across the peroneal soft tissues to the base of the scrotum. No evidence of soft tissue gas. Inflammatory features are limited predominantly to the right of midline. No bowel containing hernias. No free air or fluid in the imaged pelvis. Musculoskeletal: No acute osseous abnormality or suspicious osseous lesion. IMPRESSION: Inflammation, phlegmon and possible abscess along the right medial gluteal cleft  with extension into the ischioanal fossa and anteriorly with likely transsphincteric given thickening of the levator plate, intersphincteric fat stranding, and rectal wall thickening. A fistulous connection is not clearly discernible within the resolution of CT imaging. Overlying cellulitis and associated inflammatory features extend across the right medial gluteal cleft and anteriorly to the base of the scrotum. No soft tissue gas is seen. Segmental thickening of the sigmoid colon without acute pericolonic or diverticular inflammation. Such findings can be seen as sequela of chronic inflammation/prior episodes of diverticulitis however correlation with outpatient colonoscopy should be considered if not performed recently to exclude underlying mass lesions. Electronically Signed   By: Kreg Shropshire M.D.   On: 03/06/2019 20:05        Scheduled Meds: . atorvastatin  80 mg Oral Daily  . clopidogrel  75 mg Oral Daily  . enoxaparin (LOVENOX) injection  40 mg Subcutaneous BID  . insulin aspart  0-15 Units Subcutaneous TID WC  . insulin aspart  0-5 Units Subcutaneous QHS  . linagliptin  5 mg Oral Daily  . lisinopril  10 mg Oral Daily  . metoprolol tartrate  25 mg Oral BID  . ondansetron (ZOFRAN) IV  4 mg Intravenous Once   Continuous Infusions: . sodium chloride 40 mL (03/07/19 1209)  . cefTRIAXone (ROCEPHIN)  IV 1 g (03/07/19 0900)  . metronidazole 500 mg (03/07/19 0906)  . vancomycin 1,500 mg (03/07/19 1211)     LOS: 1 day    Time spent: 25 minutes    Edwin Dada, MD [facility] 03/07/2019, 1:12 PM     Please page though [place] or Epic secure chat:  For [employer], Adult nurse

## 2019-03-07 NOTE — Consult Note (Signed)
Date of Consultation:  03/07/2019  Requesting Physician:  Enid Derry, PA-C  Reason for Consultation:  Peri-rectal abscess  History of Present Illness: [name] is a 49 y.o. male presenting with a 1-week history of right buttocks and perianal pain.  He reports that about a week ago, he noticed some dry skin/scaling at the medial right gluteal cleft.  There was no pain at that point, but then a couple days later, while showering, felt an "egg-size" lump in his medial right buttocks.  It was not painful then, but then it progressively became larger and painful as well.  The overlying skin became more erythematous, indurated, and almost ulcerated.  He took pictures to document the progression.  He also had fevers earlier in the course, and after talking to his PCP about his symptoms, he was started on doxycycline and also had a COVID test which was negative.  As things were worsening, he called his PCP again and was advised to go to the ER for further evaluation.  In the ER, he was found to have a WBC of 12.9, normal lactic acid of 1.1, but glucose of 204.  CT scan of the pelvis was done, showing a 9.3 x 4.6 x 3.4 area of fluid and phlegmon in the right medial buttocks, going to the perirectal region.  I have independently viewed the imaging studies and agree with the findings.  He had an I&D at bedside in the ED, and a lot of purulent fluid was expressed.  A 1/4 inch gauze was packed and the patient was admitted for IV antibiotic management and surgical consult.  Today, the patient reports he feels much better.  His pain is much improved.  Prior to the procedure, he was having pain sitting down and now he tolerates it better.    Past Medical History: Past Medical History:  Diagnosis Date  . Coronary artery disease    Inferior myocardial infarction in 2004.  He was treated with thrombolytics at [facility].  He then had an inferior MI in March 2007.  He received a Horizon study stent 3.5 x 20 mm in  the distal RCA at [facility].  No other obstructive disease.  . Diabetes mellitus    TYPE II  . Erectile dysfunction   . Hyperlipidemia   . Hypertension   . MI (myocardial infarction) (HCC) 9381,0175  . Morbid obesity (HCC)   . Sleep apnea      Past Surgical History: Past Surgical History:  Procedure Laterality Date  . CARDIAC CATHETERIZATION     @ [facility]  . LEFT HEART CATH AND CORONARY ANGIOGRAPHY N/A 07/05/2016   Procedure: Left Heart Cath and Coronary Angiography;  Surgeon: Alwyn Pea, MD;  Location: [facility] INVASIVE CV LAB;  Service: Cardiovascular;  Laterality: N/A;  . SEPTOPLASTY      Home Medications: Prior to Admission medications   Medication Sig Start Date End Date Taking? Authorizing Provider  atorvastatin (LIPITOR) 40 MG tablet Take 80 mg by mouth daily.    Yes [provider]  clopidogrel (PLAVIX) 75 MG tablet Take 1 tablet (75 mg total) by mouth daily. 07/06/16  Yes Enid Baas, MD  doxycycline (VIBRA-TABS) 100 MG tablet Take 100 mg by mouth 2 (two) times daily. 03/02/19  Yes [provider]  Dulaglutide (TRULICITY) 0.75 MG/0.5ML SOPN Inject 0.75 mg into the skin once a week.   Yes [provider]  lisinopril (PRINIVIL,ZESTRIL) 10 MG tablet Take 1 tablet (10 mg total) by mouth daily. 07/07/16  Yes Gladstone Lighter, MD  metFORMIN (GLUCOPHAGE) 1000 MG tablet Take 1 tablet (1,000 mg total) by mouth 2 (two) times daily with a meal. 07/06/16  Yes Gladstone Lighter, MD  metoprolol tartrate (LOPRESSOR) 25 MG tablet Take 1 tablet (25 mg total) by mouth 2 (two) times daily. 07/06/16  Yes Gladstone Lighter, MD  TRADJENTA 5 MG TABS tablet Take 5 mg by mouth daily. 02/22/17  Yes [provider]  traMADol (ULTRAM) 50 MG tablet Take 50 mg by mouth every 6 (six) hours as needed for pain. 02/13/17  Yes [provider]  aspirin 81 MG EC tablet Take 1 tablet (81 mg total) by mouth daily. Patient not taking: Reported on 03/06/2019 07/06/16    Gladstone Lighter, MD  isosorbide mononitrate (IMDUR) 30 MG 24 hr tablet Take 1 tablet (30 mg total) by mouth daily. Patient not taking: Reported on 03/06/2019 03/01/17   Bettey Costa, MD  nitroGLYCERIN (NITROSTAT) 0.4 MG SL tablet Place 1 tablet (0.4 mg total) under the tongue every 5 (five) minutes as needed for chest pain. 02/28/17   Bettey Costa, MD    Allergies: No Known Allergies  Social History:  reports that he has been smoking cigars. He has a 30.00 pack-year smoking history. He has never used smokeless tobacco. He reports that he does not drink alcohol or use drugs.   Family History: Family History  Adopted: Yes    Review of Systems: Review of Systems  Constitutional: Positive for fever. Negative for chills.  HENT: Negative for hearing loss.   Respiratory: Negative for shortness of breath.   Cardiovascular: Negative for chest pain.  Gastrointestinal: Negative for abdominal pain, nausea and vomiting.  Genitourinary: Negative for dysuria.  Musculoskeletal: Negative for myalgias.  Skin: Negative for rash.       Right medial buttocks skin changes and pain with palpable lump.  Neurological: Negative for dizziness.  Psychiatric/Behavioral: Negative for depression.    Physical Exam BP 115/61 (BP Location: Left Arm)   Pulse 83   Temp 97.8 F (36.6 C) (Oral)   Resp 16   Ht 5\' 11"  (1.803 m)   Wt 118.6 kg   SpO2 97%   BMI 36.47 kg/m  CONSTITUTIONAL: No acute distress HEENT:  Normocephalic, atraumatic, extraocular motion intact. NECK: Trachea is midline, and there is no jugular venous distension. RESPIRATORY:  Lungs are clear, and breath sounds are equal bilaterally. Normal respiratory effort without pathologic use of accessory muscles. CARDIOVASCULAR: Heart is regular without murmurs, gallops, or rubs. GI: The abdomen is soft, non-distended, non-tender.  MUSCULOSKELETAL:  Normal muscle strength and tone in all four extremities.  No peripheral edema or cyanosis. SKIN:  S/p  I&D of the medial right buttocks, near perianal region.  There is some residual induration at the periphery of the abscess are, but no residual fluctuance.  Upon pressing, there is serosanguinous fluid with minimal purulence.  Much improved pain on palpation.  No dressing change made today, and 1/4 inch gauze is still in place.  NEUROLOGIC:  Motor and sensation is grossly normal.  Cranial nerves are grossly intact. PSYCH:  Alert and oriented to person, place and time. Affect is normal.  Laboratory Analysis: Results for orders placed or performed during the hospital encounter of 03/06/19 (from the past 24 hour(s))  Basic metabolic panel     Status: Abnormal   Collection Time: 03/06/19  6:30 PM  Result Value Ref Range   Sodium 135 135 - 145 mmol/L   Potassium 3.7 3.5 - 5.1 mmol/L  Chloride 96 (L) 98 - 111 mmol/L   CO2 25 22 - 32 mmol/L   Glucose, Bld 204 (H) 70 - 99 mg/dL   BUN 17 6 - 20 mg/dL   Creatinine, Ser 0.10 0.61 - 1.24 mg/dL   Calcium 9.1 8.9 - 93.2 mg/dL   GFR calc non Af Amer >60 >60 mL/min   GFR calc Af Amer >60 >60 mL/min   Anion gap 14 5 - 15  CBC with Differential     Status: Abnormal   Collection Time: 03/06/19  6:30 PM  Result Value Ref Range   WBC 12.9 (H) 4.0 - 10.5 K/uL   RBC 5.72 4.22 - 5.81 MIL/uL   Hemoglobin 16.0 13.0 - 17.0 g/dL   HCT 35.5 73.2 - 20.2 %   MCV 83.6 80.0 - 100.0 fL   MCH 28.0 26.0 - 34.0 pg   MCHC 33.5 30.0 - 36.0 g/dL   RDW 54.2 70.6 - 23.7 %   Platelets 376 150 - 400 K/uL   nRBC 0.0 0.0 - 0.2 %   Neutrophils Relative % 78 %   Neutro Abs 10.2 (H) 1.7 - 7.7 K/uL   Lymphocytes Relative 7 %   Lymphs Abs 0.9 0.7 - 4.0 K/uL   Monocytes Relative 8 %   Monocytes Absolute 1.0 0.1 - 1.0 K/uL   Eosinophils Relative 4 %   Eosinophils Absolute 0.5 0.0 - 0.5 K/uL   Basophils Relative 1 %   Basophils Absolute 0.1 0.0 - 0.1 K/uL   Immature Granulocytes 2 %   Abs Immature Granulocytes 0.21 (H) 0.00 - 0.07 K/uL  Hemoglobin A1c     Status: Abnormal    Collection Time: 03/06/19  6:30 PM  Result Value Ref Range   Hgb A1c MFr Bld 11.5 (H) 4.8 - 5.6 %   Mean Plasma Glucose 283.35 mg/dL  Lactic acid, plasma     Status: None   Collection Time: 03/06/19  9:03 PM  Result Value Ref Range   Lactic Acid, Venous 1.1 0.5 - 1.9 mmol/L  SARS CORONAVIRUS 2 (TAT 6-24 HRS) Nasopharyngeal Nasopharyngeal Swab     Status: None   Collection Time: 03/06/19 10:15 PM   Specimen: Nasopharyngeal Swab  Result Value Ref Range   SARS Coronavirus 2 NEGATIVE NEGATIVE  Lactic acid, plasma     Status: None   Collection Time: 03/06/19 10:43 PM  Result Value Ref Range   Lactic Acid, Venous 1.2 0.5 - 1.9 mmol/L  Glucose, capillary     Status: Abnormal   Collection Time: 03/06/19 11:27 PM  Result Value Ref Range   Glucose-Capillary 157 (H) 70 - 99 mg/dL  Comprehensive metabolic panel     Status: Abnormal   Collection Time: 03/07/19  4:31 AM  Result Value Ref Range   Sodium 134 (L) 135 - 145 mmol/L   Potassium 3.7 3.5 - 5.1 mmol/L   Chloride 99 98 - 111 mmol/L   CO2 25 22 - 32 mmol/L   Glucose, Bld 189 (H) 70 - 99 mg/dL   BUN 12 6 - 20 mg/dL   Creatinine, Ser 6.28 (L) 0.61 - 1.24 mg/dL   Calcium 8.3 (L) 8.9 - 10.3 mg/dL   Total Protein 6.6 6.5 - 8.1 g/dL   Albumin 2.7 (L) 3.5 - 5.0 g/dL   AST 24 15 - 41 U/L   ALT 29 0 - 44 U/L   Alkaline Phosphatase 73 38 - 126 U/L   Total Bilirubin 0.7 0.3 - 1.2 mg/dL   GFR  calc non Af Amer >60 >60 mL/min   GFR calc Af Amer >60 >60 mL/min   Anion gap 10 5 - 15  CBC     Status: Abnormal   Collection Time: 03/07/19  4:31 AM  Result Value Ref Range   WBC 10.9 (H) 4.0 - 10.5 K/uL   RBC 5.01 4.22 - 5.81 MIL/uL   Hemoglobin 14.0 13.0 - 17.0 g/dL   HCT 83.4 19.6 - 22.2 %   MCV 83.0 80.0 - 100.0 fL   MCH 27.9 26.0 - 34.0 pg   MCHC 33.7 30.0 - 36.0 g/dL   RDW 97.9 89.2 - 11.9 %   Platelets 367 150 - 400 K/uL   nRBC 0.0 0.0 - 0.2 %  Glucose, capillary     Status: Abnormal   Collection Time: 03/07/19  7:46 AM  Result  Value Ref Range   Glucose-Capillary 194 (H) 70 - 99 mg/dL  Glucose, capillary     Status: Abnormal   Collection Time: 03/07/19 11:57 AM  Result Value Ref Range   Glucose-Capillary 168 (H) 70 - 99 mg/dL    Imaging: CT PELVIS W CONTRAST  Result Date: 03/06/2019 CLINICAL DATA:  Right buttock abscess, no drainage EXAM: CT PELVIS WITH CONTRAST TECHNIQUE: Multidetector CT imaging of the pelvis was performed using the standard protocol following the bolus administration of intravenous contrast. CONTRAST:  OMNIPAQUE IOHEXOL 300 MG/ML  SOLN COMPARISON:  CT abdomen 11/23/2009 FINDINGS: Urinary Tract: Inferior portions of the kidneys are unremarkable. Included ureters free of acute abnormality. No bladder wall thickening, debris or calculi. Bowel: No small bowel dilatation or wall thickening. There are scattered colonic diverticula without acute diverticular inflammation to suggest active diverticulitis. A segment of the distal sigmoid colon appears circumferentially thickened which is overall similar to comparison exam and may reflect sequela of chronic inflammation. Mild distal rectal thickening with stranding in the intersphincteric plane, detailed further below. Vascular/Lymphatic: Atherosclerotic plaque within the normal caliber aorta. No suspicious or enlarged lymph nodes in the included lymphatic chains. Reproductive:  The prostate and seminal vesicles are unremarkable. Other: There is mild distal rectal thickening with hazy stranding in the intersphincteric plane. Phlegmonous change extends beyond the level of the levator plate extending posteriorly into the ischioanal fossa and along the right gluteal cleft with ill-defined phlegmon and possible fluid collection along the anti inferomedial gluteal cleft measuring up to 9.3 x 4.6 x 3.4 cm with overlying skin thickening and features of cellulitis. Inflammation and phlegmonous change extends anteriorly as well across the peroneal soft tissues to the base  of the scrotum. No evidence of soft tissue gas. Inflammatory features are limited predominantly to the right of midline. No bowel containing hernias. No free air or fluid in the imaged pelvis. Musculoskeletal: No acute osseous abnormality or suspicious osseous lesion. IMPRESSION: Inflammation, phlegmon and possible abscess along the right medial gluteal cleft with extension into the ischioanal fossa and anteriorly with likely transsphincteric given thickening of the levator plate, intersphincteric fat stranding, and rectal wall thickening. A fistulous connection is not clearly discernible within the resolution of CT imaging. Overlying cellulitis and associated inflammatory features extend across the right medial gluteal cleft and anteriorly to the base of the scrotum. No soft tissue gas is seen. Segmental thickening of the sigmoid colon without acute pericolonic or diverticular inflammation. Such findings can be seen as sequela of chronic inflammation/prior episodes of diverticulitis however correlation with outpatient colonoscopy should be considered if not performed recently to exclude underlying mass lesions. Electronically Signed  By: Kreg Shropshire M.D.   On: 03/06/2019 20:05    Assessment and Plan: This is a 49 y.o. male s/p I&D of large perianal/perirectal abscess.  --The I&D done worked really well.  The area looks much improved relative to the pictures pre-procedure that the patient showed me.  There is no significant purulent fluid remaining.   --Recommend continuing IV antibiotics for now given the size of the abscess.  His WBC is improved today as well, but still mildly elevated.  Will repeat tomorrow.  Will also do wick dressing change tomorrow too.  Pending his improvement, could potentially d/c home tomorrow.  Face-to-face time spent with the patient and care providers was 55 minutes, with more than 50% of the time spent counseling, educating, and coordinating care of the patient.     Howie Ill, MD [facility] Pg:  [phone]

## 2019-03-07 NOTE — Progress Notes (Signed)
Pharmacy Antibiotic Note  Jesse [name] is a 49 y.o. male admitted on 03/06/2019 with cellulitis.  Pharmacy has been consulted for vanc/cefepime dosing.  Plan: Patient received vanc 2g IV load and cefepime 2g IV x 1  Vancomycin 1500 mg IV Q 12 hrs. Goal AUC 400-550. Expected AUC: 485.6 SCr used: 0.8 Cssmin: 11.9  02/14 @ 0500 Scr lower than last night's, will continue w/ above regimen and continue to monitor.  Height: 5\' 11"  (180.3 cm) Weight: 261 lb 7.5 oz (118.6 kg) IBW/kg (Calculated) : 75.3  Temp (24hrs), Avg:98.4 F (36.9 C), Min:97.8 F (36.6 C), Max:99 F (37.2 C)  Recent Labs  Lab 03/06/19 1830 03/06/19 2103 03/06/19 2243 03/07/19 0431  WBC 12.9*  --   --  10.9*  CREATININE 0.71  --   --  0.53*  LATICACIDVEN  --  1.1 1.2  --     Estimated Creatinine Clearance: 147.9 mL/min (A) (by C-G formula based on SCr of 0.53 mg/dL (L)).    No Known Allergies  Thank you for allowing pharmacy to be a part of this patient's care.  03/09/19, PharmD, BCPS Clinical Pharmacist 03/07/2019 5:31 AM

## 2019-03-07 NOTE — Progress Notes (Signed)
Pharmacy Antibiotic Note  Jesse [name] is a 49 y.o. male admitted on 03/06/2019 with cellulitis.  Pharmacy has been consulted for vanc/cefepime dosing.  Plan: Patient received vanc 2g IV load and cefepime 2g IV x 1  Vancomycin 1500 mg IV Q 12 hrs. Goal AUC 400-550. Expected AUC: 485.6 SCr used: 0.8 Cssmin: 11.9  Will continue cefepime 2g IV q8h and will continue to monitor renal function w/ am labs and adjust regimen if any changes in renal function occur.  Height: 5\' 11"  (180.3 cm) Weight: 261 lb 7.5 oz (118.6 kg) IBW/kg (Calculated) : 75.3  Temp (24hrs), Avg:98.5 F (36.9 C), Min:97.8 F (36.6 C), Max:99 F (37.2 C)  Recent Labs  Lab 03/06/19 1830 03/06/19 2103 03/06/19 2243  WBC 12.9*  --   --   CREATININE 0.71  --   --   LATICACIDVEN  --  1.1 1.2    Estimated Creatinine Clearance: 147.9 mL/min (by C-G formula based on SCr of 0.71 mg/dL).    No Known Allergies  Thank you for allowing pharmacy to be a part of this patient's care.  03/08/19, PharmD, BCPS Clinical Pharmacist 03/07/2019 2:54 AM

## 2019-03-08 DIAGNOSIS — L0231 Cutaneous abscess of buttock: Secondary | ICD-10-CM

## 2019-03-08 LAB — BASIC METABOLIC PANEL
Anion gap: 9 (ref 5–15)
BUN: 12 mg/dL (ref 6–20)
CO2: 24 mmol/L (ref 22–32)
Calcium: 8.2 mg/dL — ABNORMAL LOW (ref 8.9–10.3)
Chloride: 101 mmol/L (ref 98–111)
Creatinine, Ser: 0.6 mg/dL — ABNORMAL LOW (ref 0.61–1.24)
GFR calc Af Amer: >60 mL/min (ref >60)
GFR calc non Af Amer: >60 mL/min (ref >60)
Glucose, Bld: 195 mg/dL — ABNORMAL HIGH (ref 70–99)
Potassium: 3.8 mmol/L (ref 3.5–5.1)
Sodium: 134 mmol/L — ABNORMAL LOW (ref 135–145)

## 2019-03-08 LAB — CBC
HCT: 41.1 % (ref 39.0–52.0)
Hemoglobin: 13.7 g/dL (ref 13.0–17.0)
MCH: 27.8 pg (ref 26.0–34.0)
MCHC: 33.3 g/dL (ref 30.0–36.0)
MCV: 83.5 fL (ref 80.0–100.0)
Platelets: 360 10*3/uL (ref 150–400)
RBC: 4.92 MIL/uL (ref 4.22–5.81)
RDW: 12.4 % (ref 11.5–15.5)
WBC: 7.6 10*3/uL (ref 4.0–10.5)
nRBC: 0 % (ref 0.0–0.2)

## 2019-03-08 LAB — GLUCOSE, CAPILLARY: Glucose-Capillary: 185 mg/dL — ABNORMAL HIGH (ref 70–99)

## 2019-03-08 MED ORDER — BLOOD GLUCOSE MONITOR KIT: PACK | 0 refills | Status: DC

## 2019-03-08 MED ORDER — AMOXICILLIN-POT CLAVULANATE 875-125 MG PO TABS: 1.0000 | ORAL_TABLET | Freq: Two times a day (BID) | ORAL | 0 refills | Status: AC

## 2019-03-08 MED ORDER — OXYCODONE HCL 5 MG PO TABS: 5.0000 mg | ORAL_TABLET | Freq: Three times a day (TID) | ORAL | 0 refills | Status: DC | PRN

## 2019-03-08 MED ORDER — NOVOLOG FLEXPEN 100 UNIT/ML ~~LOC~~ SOPN: 0.0000 [IU] | PEN_INJECTOR | Freq: Three times a day (TID) | SUBCUTANEOUS | 0 refills | Status: DC

## 2019-03-08 NOTE — Progress Notes (Signed)
Jesse [name] to be D/C'd Home per MD order.  Discussed prescriptions and follow up appointments with the patient. Prescriptions given to patient, medication list explained in detail. Pt verbalized understanding.  Allergies as of 03/08/2019   No Known Allergies     Medication List    STOP taking these medications   isosorbide mononitrate 30 MG 24 hr tablet Commonly known as: IMDUR     TAKE these medications   amoxicillin-clavulanate 875-125 MG tablet Commonly known as: Augmentin Take 1 tablet by mouth 2 (two) times daily for 6 days.   aspirin 81 MG EC tablet Take 1 tablet (81 mg total) by mouth daily.   atorvastatin 40 MG tablet Commonly known as: LIPITOR Take 80 mg by mouth daily.   blood glucose meter kit and supplies Kit Dispense based on patient and insurance preference. Use up to four times daily as directed. (FOR ICD-9 250.00, 250.01).   clopidogrel 75 MG tablet Commonly known as: PLAVIX Take 1 tablet (75 mg total) by mouth daily.   doxycycline 100 MG tablet Commonly known as: VIBRA-TABS Take 100 mg by mouth 2 (two) times daily.   lisinopril 10 MG tablet Commonly known as: ZESTRIL Take 1 tablet (10 mg total) by mouth daily.   metFORMIN 1000 MG tablet Commonly known as: GLUCOPHAGE Take 1 tablet (1,000 mg total) by mouth 2 (two) times daily with a meal.   metoprolol tartrate 25 MG tablet Commonly known as: LOPRESSOR Take 1 tablet (25 mg total) by mouth 2 (two) times daily.   nitroGLYCERIN 0.4 MG SL tablet Commonly known as: NITROSTAT Place 1 tablet (0.4 mg total) under the tongue every 5 (five) minutes as needed for chest pain.   NovoLOG FlexPen 100 UNIT/ML FlexPen Generic drug: insulin aspart Inject 0-10 Units into the skin 3 (three) times daily with meals. According to separate sliding scale   oxyCODONE 5 MG immediate release tablet Commonly known as: Roxicodone Take 1 tablet (5 mg total) by mouth every 8 (eight) hours as needed.   Tradjenta 5 MG  Tabs tablet Generic drug: linagliptin Take 5 mg by mouth daily.   traMADol 50 MG tablet Commonly known as: ULTRAM Take 50 mg by mouth every 6 (six) hours as needed for pain.   Trulicity 8.12 XN/1.7GY Sopn Generic drug: Dulaglutide Inject 0.75 mg into the skin once a week.       Vitals:   03/08/19 0422 03/08/19 0800  BP: 131/79 131/89  Pulse: 80 81  Resp: 20   Temp: 98 F (36.7 C) 97.7 F (36.5 C)  SpO2: 98% 98%    Skin clean, dry and intact without evidence of skin break down, no evidence of skin tears noted. IV catheter discontinued intact. Site without signs and symptoms of complications. Dressing and pressure applied. Pt denies pain at this time. No complaints noted.  An After Visit Summary was printed and given to the patient. Patient escorted via [place], and D/C home via private auto.  Fuller Mandril, RN

## 2019-03-08 NOTE — Discharge Instructions (Signed)
Dressing changes:  Please change packing gauze dressing once daily and as needed if the gauze falls off.  Use tweezers to push the gauze into the wound and pack the wound until resistance is met, and then stop.  Cut the packing gauze leaving a tail outside the wound.  Cover the wound with dry gauze and tape.

## 2019-03-08 NOTE — Progress Notes (Signed)
Phoned patient and spoke with his wife.  She states that they have been discharged.  Briefly discussed A1C of 11.5% and the addition of Novolog correction prior to meals.  Wife states that they have done this in the past and feel comfortable administering insulin and checking blood sugars.  Encouraged follow-up with PCP.  They verbalized understanding.   Thanks  Beryl Meager, RN, BC-ADM Inpatient Diabetes Coordinator Pager [phone]

## 2019-03-08 NOTE — Discharge Summary (Signed)
Physician Discharge Summary  [name]:[id number] DOB: [birth year] DOA: 03/06/2019  PCP: [name], MD  Admit date: 03/06/2019 Discharge date: 03/08/2019  Admitted From: Home  Disposition:  Home   Recommendations for Outpatient Follow-up:  1. Follow up with General Surgery in 1-2 weeks 2. Dr. Lennox Grumbles: Please refer for colonoscopy in 4-6 weeks due to CT findings below     Home Health: None  Equipment/Devices: None  Discharge Condition: Good  CODE STATUS: FULL Diet recommendation: Diabetic  Brief/Interim Summary: Mr. Jesse [name] is a 49 y.o. M with DM, CAD s/p PCI 2007, HTN, OSA and MO who presented with worsening rectal pain and swelling.  Had recently developed a boil around his right buttock, which became progressively worse.  He saw his PCP via televisit and started doxycycline, but this continued to worsen, and developed new fever and chills despite doxycycline, and he could not sit down so he came to the ER.  In the ER pulse 150, WBC 12 K, and CT abdomen and pelvis showed an abscess with phlegmon and extension of cellulitis in the gluteal region.  The lesion was I&D in the ER, general surgery were consulted, the patient was admitted for IV antibiotics for failed outpatient management of perirectal abscess.      PRINCIPAL HOSPITAL DIAGNOSIS: Perirectal abscess    Discharge Diagnoses:  Perirectal abscess SIRS present at admission, sepsis ruled out, no endorgan damage.  The patient failed outpatient therapy with doxycycline, and developed a large abscess with cellulitis.  This underwent I&D in the ER, cultures unfortunately not sent.  Patient had rapid improvement after I&D.  He was treated with 48 hours vancomycin, CTX and flagyl and continued to improve.  Discharged to complete 6 more days doxycycline for MRSA coverage and Augmentin as standard perirectal abscess coverage.    Follow up with General Surgery   Coronary disease secondary  prevention Hypertension Continue atorvastatin, Plavix, lisinopril, metoprolol  Sleep apnea Morbid obesity  Diabetes A1c 11.5%, poor control at baseline. Continue linagliptin, Trulicity, metformin Temporary Novolog Flexpen with sliding scale corrections while infection healing.     Abnormal CT colon Patient had incidentally noted colon thickening on CT. Radiology and I recommend colonoscopic follow up in 4-6 weeks after resolution of abscess.             Discharge Instructions  Discharge Instructions    Discharge instructions   Complete by: As directed    From Dr. Loleta Books: You were admitted for perirectal abscess  Take the rest of the doxycycline 100 mg twice daily until gone Take Augmentin 875-125 mg twice daily for 6 days  Pack the wound daily and cover as instructed by Dr. Hampton Abbot Follow up with Dr. Hampton Abbot within 2 weeks, or sooner if instructed, see contact info below  For the next 2 weeks, take Novolog Flexpen as directed below: Check your sugar three times daily before meals Based on the sugar, give yourself this much insulins:  If blood sugar LESS THAN 70 mg/dL: drink some orange juice or something sugary and check again in 15 minutes.  If not better, call 9-1-1  If blood sugar 70 - 150 mg/dL: 0 units If blood sugar 151 - 200 mg/dL: 2 units If blood sugar 201 - 250 mg/dL: 4 units If blood sugar 251 - 300 mg/dL: 6 units If blood sugar 301 - 350 mg/dL: 8 units If blood sugar 351 - 400 mg/dL: 10 units  If blood sugar GREATER THAN 400 mg/dL: Give 10 units and call  your primary care doctor   Increase activity slowly   Complete by: As directed      Allergies as of 03/08/2019   No Known Allergies     Medication List    STOP taking these medications   isosorbide mononitrate 30 MG 24 hr tablet Commonly known as: IMDUR     TAKE these medications   amoxicillin-clavulanate 875-125 MG tablet Commonly known as: Augmentin Take 1 tablet by mouth 2  (two) times daily for 6 days.   aspirin 81 MG EC tablet Take 1 tablet (81 mg total) by mouth daily.   atorvastatin 40 MG tablet Commonly known as: LIPITOR Take 80 mg by mouth daily.   blood glucose meter kit and supplies Kit Dispense based on patient and insurance preference. Use up to four times daily as directed. (FOR ICD-9 250.00, 250.01).   clopidogrel 75 MG tablet Commonly known as: PLAVIX Take 1 tablet (75 mg total) by mouth daily.   doxycycline 100 MG tablet Commonly known as: VIBRA-TABS Take 100 mg by mouth 2 (two) times daily.   lisinopril 10 MG tablet Commonly known as: ZESTRIL Take 1 tablet (10 mg total) by mouth daily.   metFORMIN 1000 MG tablet Commonly known as: GLUCOPHAGE Take 1 tablet (1,000 mg total) by mouth 2 (two) times daily with a meal.   metoprolol tartrate 25 MG tablet Commonly known as: LOPRESSOR Take 1 tablet (25 mg total) by mouth 2 (two) times daily.   nitroGLYCERIN 0.4 MG SL tablet Commonly known as: NITROSTAT Place 1 tablet (0.4 mg total) under the tongue every 5 (five) minutes as needed for chest pain.   NovoLOG FlexPen 100 UNIT/ML FlexPen Generic drug: insulin aspart Inject 0-10 Units into the skin 3 (three) times daily with meals. According to separate sliding scale   Tradjenta 5 MG Tabs tablet Generic drug: linagliptin Take 5 mg by mouth daily.   traMADol 50 MG tablet Commonly known as: ULTRAM Take 50 mg by mouth every 6 (six) hours as needed for pain.   Trulicity 4.94 WH/6.7RF Sopn Generic drug: Dulaglutide Inject 0.75 mg into the skin once a week.       No Known Allergies  Consultations:  General Surgery   Procedures/Studies: CT PELVIS W CONTRAST  Result Date: 03/06/2019 CLINICAL DATA:  Right buttock abscess, no drainage EXAM: CT PELVIS WITH CONTRAST TECHNIQUE: Multidetector CT imaging of the pelvis was performed using the standard protocol following the bolus administration of intravenous contrast. CONTRAST:  123m  OMNIPAQUE IOHEXOL 300 MG/ML  SOLN COMPARISON:  CT abdomen 11/23/2009 FINDINGS: Urinary Tract: Inferior portions of the kidneys are unremarkable. Included ureters free of acute abnormality. No bladder wall thickening, debris or calculi. Bowel: No small bowel dilatation or wall thickening. There are scattered colonic diverticula without acute diverticular inflammation to suggest active diverticulitis. A segment of the distal sigmoid colon appears circumferentially thickened which is overall similar to comparison exam and may reflect sequela of chronic inflammation. Mild distal rectal thickening with stranding in the intersphincteric plane, detailed further below. Vascular/Lymphatic: Atherosclerotic plaque within the normal caliber aorta. No suspicious or enlarged lymph nodes in the included lymphatic chains. Reproductive:  The prostate and seminal vesicles are unremarkable. Other: There is mild distal rectal thickening with hazy stranding in the intersphincteric plane. Phlegmonous change extends beyond the level of the levator plate extending posteriorly into the ischioanal fossa and along the right gluteal cleft with ill-defined phlegmon and possible fluid collection along the anti inferomedial gluteal cleft measuring up to 9.3 x  4.6 x 3.4 cm with overlying skin thickening and features of cellulitis. Inflammation and phlegmonous change extends anteriorly as well across the peroneal soft tissues to the base of the scrotum. No evidence of soft tissue gas. Inflammatory features are limited predominantly to the right of midline. No bowel containing hernias. No free air or fluid in the imaged pelvis. Musculoskeletal: No acute osseous abnormality or suspicious osseous lesion. IMPRESSION: Inflammation, phlegmon and possible abscess along the right medial gluteal cleft with extension into the ischioanal fossa and anteriorly with likely transsphincteric given thickening of the levator plate, intersphincteric fat stranding,  and rectal wall thickening. A fistulous connection is not clearly discernible within the resolution of CT imaging. Overlying cellulitis and associated inflammatory features extend across the right medial gluteal cleft and anteriorly to the base of the scrotum. No soft tissue gas is seen. Segmental thickening of the sigmoid colon without acute pericolonic or diverticular inflammation. Such findings can be seen as sequela of chronic inflammation/prior episodes of diverticulitis however correlation with outpatient colonoscopy should be considered if not performed recently to exclude underlying mass lesions. Electronically Signed   By: Lovena Le M.D.   On: 03/06/2019 20:05       Subjective: Patient is feeling well.  No fever, chills, malaise, vomiting.  Discharge Exam: Vitals:   03/08/19 0422 03/08/19 0800  BP: 131/79 131/89  Pulse: 80 81  Resp: 20   Temp: 98 F (36.7 C) 97.7 F (36.5 C)  SpO2: 98% 98%   Vitals:   03/07/19 1245 03/07/19 2038 03/08/19 0422 03/08/19 0800  BP: 121/79 127/87 131/79 131/89  Pulse: 86 86 80 81  Resp:  16 20   Temp: 97.9 F (36.6 C) 97.8 F (36.6 C) 98 F (36.7 C) 97.7 F (36.5 C)  TempSrc: Oral Oral Oral Oral  SpO2: 97% 99% 98% 98%  Weight:      Height:        General: Pt is alert, awake, not in acute distress Cardiovascular: RRR, nl S1-S2, no murmurs appreciated.   No LE edema.   Respiratory: Normal respiratory rate and rhythm.  CTAB without rales or wheezes. Abdominal: Abdomen soft and non-tender.  No distension or HSM.   GU: Minimal redness and induration around the right buttock, which has scant purulent discharge, appropriate, packed. Neuro/Psych: Strength symmetric in upper and lower extremities.  Judgment and insight appear normal.   The results of significant diagnostics from this hospitalization (including imaging, microbiology, ancillary and laboratory) are listed below for reference.     Microbiology: Recent Results (from the past  240 hour(s))  SARS CORONAVIRUS 2 (TAT 6-24 HRS) Nasopharyngeal Nasopharyngeal Swab     Status: None   Collection Time: 03/06/19 10:15 PM   Specimen: Nasopharyngeal Swab  Result Value Ref Range Status   SARS Coronavirus 2 NEGATIVE NEGATIVE Final    Comment: (NOTE) SARS-CoV-2 target nucleic acids are NOT DETECTED. The SARS-CoV-2 RNA is generally detectable in upper and lower respiratory specimens during the acute phase of infection. Negative results do not preclude SARS-CoV-2 infection, do not rule out co-infections with other pathogens, and should not be used as the sole basis for treatment or other patient management decisions. Negative results must be combined with clinical observations, patient history, and epidemiological information. The expected result is Negative. Fact Sheet for Patients: SugarRoll.be Fact Sheet for Healthcare Providers: https://www.woods-mathews.com/ This test is not yet approved or cleared by the [place] FDA and  has been authorized for detection and/or diagnosis of SARS-CoV-2 by FDA  under an Emergency Use Authorization (EUA). This EUA will remain  in effect (meaning this test can be used) for the duration of the COVID-19 declaration under Section 56 4(b)(1) of the Act, 21 U.S.C. section 360bbb-3(b)(1), unless the authorization is terminated or revoked sooner. Performed at [facility], [place] [address] [place], [place] [address]      Labs: BNP (last 3 results) No results for input(s): BNP in the last 8760 hours. Basic Metabolic Panel: Recent Labs  Lab 03/06/19 1830 03/07/19 0431 03/08/19 0453  NA 135 134* 134*  K 3.7 3.7 3.8  CL 96* 99 101  CO2 25 25 24   GLUCOSE 204* 189* 195*  BUN 17 12 12   CREATININE 0.71 0.53* 0.60*  CALCIUM 9.1 8.3* 8.2*   Liver Function Tests: Recent Labs  Lab 03/07/19 0431  AST 24  ALT 29  ALKPHOS 73  BILITOT 0.7  PROT 6.6  ALBUMIN 2.7*   No results for  input(s): LIPASE, AMYLASE in the last 168 hours. No results for input(s): AMMONIA in the last 168 hours. CBC: Recent Labs  Lab 03/06/19 1830 03/07/19 0431 03/08/19 0453  WBC 12.9* 10.9* 7.6  NEUTROABS 10.2*  --   --   HGB 16.0 14.0 13.7  HCT 47.8 41.6 41.1  MCV 83.6 83.0 83.5  PLT 376 367 360   Cardiac Enzymes: No results for input(s): CKTOTAL, CKMB, CKMBINDEX, TROPONINI in the last 168 hours. BNP: Invalid input(s): POCBNP CBG: Recent Labs  Lab 03/07/19 0746 03/07/19 1157 03/07/19 1641 03/07/19 2055 03/08/19 0749  GLUCAP 194* 168* 180* 205* 185*   D-Dimer No results for input(s): DDIMER in the last 72 hours. Hgb A1c Recent Labs    03/06/19 1830  HGBA1C 11.5*   Lipid Profile No results for input(s): CHOL, HDL, LDLCALC, TRIG, CHOLHDL, LDLDIRECT in the last 72 hours. Thyroid function studies No results for input(s): TSH, T4TOTAL, T3FREE, THYROIDAB in the last 72 hours.  Invalid input(s): FREET3 Anemia work up No results for input(s): VITAMINB12, FOLATE, FERRITIN, TIBC, IRON, RETICCTPCT in the last 72 hours. Urinalysis    Component Value Date/Time   COLORURINE STRAW (A) 10/11/2014 1009   APPEARANCEUR CLEAR (A) 10/11/2014 1009   LABSPEC 1.029 10/11/2014 1009   PHURINE 7.0 10/11/2014 1009   GLUCOSEU >500 (A) 10/11/2014 1009   HGBUR NEGATIVE 10/11/2014 1009   BILIRUBINUR NEGATIVE 10/11/2014 1009   KETONESUR NEGATIVE 10/11/2014 1009   PROTEINUR NEGATIVE 10/11/2014 1009   NITRITE NEGATIVE 10/11/2014 1009   LEUKOCYTESUR NEGATIVE 10/11/2014 1009   Sepsis Labs Invalid input(s): PROCALCITONIN,  WBC,  LACTICIDVEN Microbiology Recent Results (from the past 240 hour(s))  SARS CORONAVIRUS 2 (TAT 6-24 HRS) Nasopharyngeal Nasopharyngeal Swab     Status: None   Collection Time: 03/06/19 10:15 PM   Specimen: Nasopharyngeal Swab  Result Value Ref Range Status   SARS Coronavirus 2 NEGATIVE NEGATIVE Final    Comment: (NOTE) SARS-CoV-2 target nucleic acids are NOT  DETECTED. The SARS-CoV-2 RNA is generally detectable in upper and lower respiratory specimens during the acute phase of infection. Negative results do not preclude SARS-CoV-2 infection, do not rule out co-infections with other pathogens, and should not be used as the sole basis for treatment or other patient management decisions. Negative results must be combined with clinical observations, patient history, and epidemiological information. The expected result is Negative. Fact Sheet for Patients: SugarRoll.be Fact Sheet for Healthcare Providers: https://www.woods-mathews.com/ This test is not yet approved or cleared by the [place] and  has been authorized  for detection and/or diagnosis of SARS-CoV-2 by FDA under an Emergency Use Authorization (EUA). This EUA will remain  in effect (meaning this test can be used) for the duration of the COVID-19 declaration under Section 56 4(b)(1) of the Act, 21 U.S.C. section 360bbb-3(b)(1), unless the authorization is terminated or revoked sooner. Performed at [facility], [place] [address], [place], [place] [address]      Time coordinating discharge: 35 minutes The [place] controlled substances registry was reviewed for this patient prior to filling the <5 days supply controlled substances script.      SIGNED:   Edwin Dada, MD  [facility] 03/08/2019, 10:59 AM

## 2019-03-08 NOTE — Progress Notes (Signed)
[facility] SURGICAL PROGRESS NOTE (cpt [phone])  Hospital Day(s): 2.   Interval History: Patient seen and examined, no acute events or new complaints overnight. Patient reports he is feeling much better this morning. He is still having some soreness on his right buttock. No fever or chills. Leukocytosis is resolved. Plan for dressing change today and hopefully home.   Review of Systems:  Constitutional: denies fever, chills  HEENT: denies cough or congestion  Respiratory: denies any shortness of breath  Cardiovascular: denies chest pain or palpitations  Gastrointestinal: denies abdominal pain, N/V, or diarrhea/and bowel function as per interval history Genitourinary: denies burning with urination or urinary frequency Musculoskeletal: + right medial buttock soreness  Vital signs in last 24 hours: [min-max] current  Temp:  [97.8 F (36.6 C)-98 F (36.7 C)] 98 F (36.7 C) (02/15 0422) Pulse Rate:  [80-86] 80 (02/15 0422) Resp:  [16-20] 20 (02/15 0422) BP: (115-131)/(61-87) 131/79 (02/15 0422) SpO2:  [97 %-99 %] 98 % (02/15 0422)     Height: 5\' 11"  (180.3 cm) Weight: 118.6 kg BMI (Calculated): 36.48   Intake/Output last 2 shifts:  02/14 0701 - 02/15 0700 In: 1158.5 [P.O.:240; I.V.:7.8; IV Piggyback:910.8] Out: 1175 [Urine:1175]   Physical Exam:  Constitutional: alert, cooperative and no distress  HENT: normocephalic without obvious abnormality  Eyes: PERRL, EOM's grossly intact and symmetric  Respiratory: breathing non-labored at rest  Cardiovascular: regular rate and sinus rhythm  Integumentary: Small 0.5 cm incision to the right medial gluteal crease, there is surrounding induration, no additional fluctuance, some fibrinous drainage, no additional erythema   Labs:  CBC Latest Ref Rng & Units 03/08/2019 03/07/2019 03/06/2019  WBC 4.0 - 10.5 K/uL 7.6 10.9(H) 12.9(H)  Hemoglobin 13.0 - 17.0 g/dL 03/08/2019 41.3 24.4  Hematocrit 39.0 - 52.0 % 41.1 41.6 47.8  Platelets  150 - 400 K/uL 360 367 376   CMP Latest Ref Rng & Units 03/08/2019 03/07/2019 03/06/2019  Glucose 70 - 99 mg/dL 03/08/2019) 272(Z) 366(Y)  BUN 6 - 20 mg/dL 12 12 17   Creatinine 0.61 - 1.24 mg/dL 403(K) ) 7.42(V  Sodium 135 - 145 mmol/L 134(L) 134(L) 135  Potassium 3.5 - 5.1 mmol/L 3.8 3.7 3.7  Chloride 98 - 111 mmol/L 101 99 96(L)  CO2 22 - 32 mmol/L 24 25 25   Calcium 8.9 - 10.3 mg/dL 8.2(L) 8.3(L) 9.1  Total Protein 6.5 - 8.1 g/dL - 6.6 -  Total Bilirubin 0.3 - 1.2 mg/dL - 0.7 -  Alkaline Phos 38 - 126 U/L - 73 -  AST 15 - 41 U/L - 24 -  ALT 0 - 44 U/L - 29 -    Imaging studies: No new pertinent imaging studies   Assessment/Plan: (ICD-10's: L02.31) 49 y.o. male overall doing better with improvement in leukocytosis attributable to peri-rectal/peri-anal abscess s/p I&D in the ED on 02/13   - Dressing change preformed at bedside and wife educated on changes; asked RN to ensure supplies for home   - Will prescribe ABx (Augmentin x10 days) and pain control for dressing changes   - Continue dressing changes daily   - Further management per primary team; okay for discharge; follow up in 1 week with surgery   All of the above findings and recommendations were discussed with the patient, patient's family (Wife), and the medical team, and all of patient's questions were answered to their expressed satisfaction.  -- 30, PA-C [facility] 03/08/2019, 7:18 AM [phone] M-F: 7am - 4pm

## 2019-03-12 ENCOUNTER — Ambulatory Visit (INDEPENDENT_AMBULATORY_CARE_PROVIDER_SITE_OTHER): Payer: Medicare Other | Admitting: Surgery

## 2019-03-12 ENCOUNTER — Encounter: Payer: Self-pay | Admitting: Surgery

## 2019-03-12 ENCOUNTER — Other Ambulatory Visit: Payer: Self-pay

## 2019-03-12 VITALS — BP 160/90 | HR 97 | Temp 97.9°F | Resp 14 | Ht 71.0 in | Wt 262.0 lb

## 2019-03-12 DIAGNOSIS — K611 Rectal abscess: Secondary | ICD-10-CM

## 2019-03-12 MED ORDER — HYDROCODONE-ACETAMINOPHEN 10-325 MG PO TABS: 1.0000 | ORAL_TABLET | Freq: Four times a day (QID) | ORAL | 0 refills | Status: AC | PRN

## 2019-03-12 NOTE — Patient Instructions (Addendum)
Take a Tylenol with your Oxycodone.  Continue to pack the area once daily. We have sent 1/2 inch packing with you which may work better with the larger area.  Follow up here in one week  Finish your antibiotics.

## 2019-03-12 NOTE — Progress Notes (Signed)
[name]  03/12/2019  History of Present Illness: Jesse [name] is a 49 y.o. male with a history of right gluteal abscess s/p I&D by the EDP on 02/13. He was admitted to [facility] from 02/13-02/15 and did well.   This morning, he reports that he is doing better. The pain at the abscess site has nearly resolved and the majority of his discomfort is associated with dressing changes. He denied any fever or chills at home. He has been tolerating the dressing changes at home well and using his pain medications prior to these. He notes that the drainage has slowed down over the last few days and has been "pinkish" in color. Interestingly, he notes that with his dressing change yesterday a large piece of tissue which appeared to look like "chicken fat" came out of the wound with the packing. He will finish his ABx over the next few days. No additional issues or complaints.    Past Medical History: Past Medical History:  Diagnosis Date  . Coronary artery disease    Inferior myocardial infarction in 2004.  He was treated with thrombolytics at [name].  He then had an inferior MI in March 2007.  He received a Horizon study stent 3.5 x 20 mm in the distal RCA at [facility].  No other obstructive disease.  . Diabetes mellitus    TYPE II  . Erectile dysfunction   . Hyperlipidemia   . Hypertension   . MI (myocardial infarction) ([place]) 5993,5701  . Morbid obesity ([place])   . Sleep apnea      Past Surgical History: Past Surgical History:  Procedure Laterality Date  . CARDIAC CATHETERIZATION     @ [place]  . LEFT HEART CATH AND CORONARY ANGIOGRAPHY N/A 07/05/2016   Procedure: Left Heart Cath and Coronary Angiography;  Surgeon: Yolonda Kida, MD;  Location: [place] [facility];  Service: Cardiovascular;  Laterality: N/A;  . SEPTOPLASTY      Home Medications: Prior to Admission medications   Medication Sig Start Date End Date Taking? Authorizing Provider    amoxicillin-clavulanate (AUGMENTIN) 875-125 MG tablet Take 1 tablet by mouth 2 (two) times daily for 6 days. 03/08/19 03/14/19  Edwin Dada, MD  aspirin 81 MG EC tablet Take 1 tablet (81 mg total) by mouth daily. Patient not taking: Reported on 03/06/2019 07/06/16   Gladstone Lighter, MD  atorvastatin (LIPITOR) 40 MG tablet Take 80 mg by mouth daily.     [provider]  blood glucose meter kit and supplies KIT Dispense based on patient and insurance preference. Use up to four times daily as directed. (FOR ICD-9 250.00, 250.01). 03/08/19   Danford, Suann Larry, MD  clopidogrel (PLAVIX) 75 MG tablet Take 1 tablet (75 mg total) by mouth daily. 07/06/16   Gladstone Lighter, MD  doxycycline (VIBRA-TABS) 100 MG tablet Take 100 mg by mouth 2 (two) times daily. 03/02/19   [provider]  Dulaglutide (TRULICITY) 7.79 TJ/0.3ES SOPN Inject 0.75 mg into the skin once a week.    [provider]  insulin aspart (NOVOLOG FLEXPEN) 100 UNIT/ML FlexPen Inject 0-10 Units into the skin 3 (three) times daily with meals. According to separate sliding scale 03/08/19   Danford, Suann Larry, MD  lisinopril (PRINIVIL,ZESTRIL) 10 MG tablet Take 1 tablet (10 mg total) by mouth daily. 07/07/16   Gladstone Lighter, MD  metFORMIN (GLUCOPHAGE) 1000 MG tablet Take 1 tablet (1,000 mg total) by mouth 2 (two) times daily with a  meal. 07/06/16   Gladstone Lighter, MD  metoprolol tartrate (LOPRESSOR) 25 MG tablet Take 1 tablet (25 mg total) by mouth 2 (two) times daily. 07/06/16   Gladstone Lighter, MD  nitroGLYCERIN (NITROSTAT) 0.4 MG SL tablet Place 1 tablet (0.4 mg total) under the tongue every 5 (five) minutes as needed for chest pain. 02/28/17   Bettey Costa, MD  oxyCODONE (ROXICODONE) 5 MG immediate release tablet Take 1 tablet (5 mg total) by mouth every 8 (eight) hours as needed. 03/08/19 03/07/20  Danford, Suann Larry, MD  TRADJENTA 5 MG TABS tablet Take 5 mg by mouth daily. 02/22/17   [provider]  traMADol (ULTRAM) 50 MG tablet Take 50 mg by mouth every 6 (six) hours as needed for pain. 02/13/17   [provider]    Allergies: No Known Allergies  Review of Systems: Review of Systems  Constitutional: Negative for chills and fever.  Gastrointestinal: Negative for diarrhea, nausea and vomiting.  Skin:       + Right Gluteal Abscess (Improving)  All other systems reviewed and are negative.   Physical Exam BP (!) 160/90   Pulse 97   Temp 97.9 F (36.6 C)   Resp 14   Ht 5' 11"  (1.803 m)   Wt 262 lb (118.8 kg)   SpO2 98%   BMI 36.54 kg/m   Physical Exam Vitals and nursing note reviewed. Exam conducted with a chaperone present.  Constitutional:      General: He is not in acute distress.    Appearance: Normal appearance. He is obese. He is not ill-appearing.  HENT:     Head: Normocephalic and atraumatic.  Eyes:     General: No scleral icterus.    Conjunctiva/sclera: Conjunctivae normal.     Comments: Wearing Glasses  Pulmonary:     Effort: Pulmonary effort is normal. No respiratory distress.  Genitourinary:      Comments: There is an approximately 1 cm incision to the right medial gluteal crease lateral to the anal opening. The surrounding tissue is non-erythematous and soft, no palpable fluctuance. Packing removed which revealed what appeared to be abscess/cyst wall which was sharply excised. No drainage appreciable. Wound bed with healthy appearing tissue. Packing replaced Musculoskeletal:     Right lower leg: No edema.     Left lower leg: No edema.  Skin:    General: Skin is warm and dry.  Neurological:     General: No focal deficit present.     Mental Status: He is alert and oriented to person, place, and time.  Psychiatric:        Mood and Affect: Mood normal.        Behavior: Behavior normal.     Labs/Imaging: No new pertinent imaging or labs   Assessment and Plan: This is a 49 y.o. male with a history of right gluteal abscess  s/p I&D by the EDP on 02/13.   - Abscess/Cyst wall sharply excised from wound bed, wound irrigated, and 1/4 iodoform packing placed into wound and covered with guaze. Patient tolerated well  - Reviewed wound care instructions  - Pain medications refilled for use prior to dressing changes   -Complete ABx as prescribed  - Will follow up with Dr Hampton Abbot in 1 week for wound check  Face-to-face time spent with the patient and care providers was 15 minutes, with more than 50% of the time spent counseling, educating, and coordinating care of the patient.    -- Jesse Simon, PA-C [facility] 03/12/2019, 11:01 AM [phone] M-F: 7am - 4pm

## 2019-03-18 ENCOUNTER — Encounter: Payer: Self-pay | Admitting: Urology

## 2019-03-18 ENCOUNTER — Other Ambulatory Visit: Payer: Self-pay

## 2019-03-18 ENCOUNTER — Ambulatory Visit (INDEPENDENT_AMBULATORY_CARE_PROVIDER_SITE_OTHER): Payer: Medicare Other | Admitting: Urology

## 2019-03-18 VITALS — BP 168/102 | HR 96 | Ht 71.0 in | Wt 264.0 lb

## 2019-03-18 DIAGNOSIS — N5201 Erectile dysfunction due to arterial insufficiency: Secondary | ICD-10-CM

## 2019-03-18 MED ORDER — TADALAFIL 20 MG PO TABS: ORAL_TABLET | ORAL | 0 refills | Status: DC

## 2019-03-18 NOTE — Progress Notes (Signed)
03/18/2019 2:40 PM   Jesse [name] Nov 27, 1970 [id number]  Referring provider: [name], [place] [address] [place],  [place] [address]  Chief Complaint  Patient presents with  . Erectile Dysfunction    HPI: Jesse [name] is a 49 YO male seen in consultation at the request of Dr. Lennox Grumbles for evaluation of erectile dysfunction.  He presents with a 1-2 year history of difficulty achieving and maintaining an erection.  He ordered a vacuum erection device and felt the compression band was too tight.  After using it a few times he noted curvature of his penis to the left however this has significantly improved.  He currently has partial erections which are not firm enough for penetration.  He has tried sildenafil at 50 and 100 mg which was not effective.   Organic risk factors include coronary artery disease, diabetes, hyperlipidemia, hypertension and antihypertensive medications.  He feels his symptoms worsen after his last heart catheterization.   PMH: Past Medical History:  Diagnosis Date  . Coronary artery disease    Inferior myocardial infarction in 2004.  He was treated with thrombolytics at [facility].  He then had an inferior MI in March 2007.  He received a Horizon study stent 3.5 x 20 mm in the distal RCA at [facility].  No other obstructive disease.  . Diabetes mellitus    TYPE II  . Erectile dysfunction   . Hyperlipidemia   . Hypertension   . MI (myocardial infarction) ([place]) 9030,0923  . Morbid obesity ([place])   . Sleep apnea     Surgical History: Past Surgical History:  Procedure Laterality Date  . CARDIAC CATHETERIZATION     @ [place]  . LEFT HEART CATH AND CORONARY ANGIOGRAPHY N/A 07/05/2016   Procedure: Left Heart Cath and Coronary Angiography;  Surgeon: Yolonda Kida, MD;  Location: [place] [facility];  Service: Cardiovascular;  Laterality: N/A;  . SEPTOPLASTY      Home Medications:  Allergies as of 03/18/2019   No Known Allergies     Medication List         Accurate as of March 18, 2019  2:40 PM. If you have any questions, ask your nurse or doctor.        aspirin 81 MG EC tablet Take 1 tablet (81 mg total) by mouth daily.   atorvastatin 40 MG tablet Commonly known as: LIPITOR Take 80 mg by mouth daily.   blood glucose meter kit and supplies Kit Dispense based on patient and insurance preference. Use up to four times daily as directed. (FOR ICD-9 250.00, 250.01).   clopidogrel 75 MG tablet Commonly known as: PLAVIX Take 1 tablet (75 mg total) by mouth daily.   doxycycline 100 MG tablet Commonly known as: VIBRA-TABS Take 100 mg by mouth 2 (two) times daily.   HYDROcodone-acetaminophen 10-325 MG tablet Commonly known as: NORCO Take 1 tablet by mouth every 6 (six) hours as needed (For dressing changes).   lisinopril 10 MG tablet Commonly known as: ZESTRIL Take 1 tablet (10 mg total) by mouth daily.   metFORMIN 1000 MG tablet Commonly known as: GLUCOPHAGE Take 1 tablet (1,000 mg total) by mouth 2 (two) times daily with a meal.   metoprolol tartrate 25 MG tablet Commonly known as: LOPRESSOR Take 1 tablet (25 mg total) by mouth 2 (two) times daily.   nitroGLYCERIN 0.4 MG SL tablet Commonly known as: NITROSTAT Place 1 tablet (0.4 mg total) under the tongue every 5 (five) minutes as  needed for chest pain.   NovoLOG FlexPen 100 UNIT/ML FlexPen Generic drug: insulin aspart Inject 0-10 Units into the skin 3 (three) times daily with meals. According to separate sliding scale   oxyCODONE 5 MG immediate release tablet Commonly known as: Roxicodone Take 1 tablet (5 mg total) by mouth every 8 (eight) hours as needed.   Tradjenta 5 MG Tabs tablet Generic drug: linagliptin Take 5 mg by mouth daily.   traMADol 50 MG tablet Commonly known as: ULTRAM Take 50 mg by mouth every 6 (six) hours as needed for pain.   Trulicity 2.42 AS/3.4HD Sopn Generic drug: Dulaglutide Inject 0.75 mg into the skin once a week.        Allergies: No Known Allergies  Family History: Family History  Adopted: Yes    Social History:  reports that he has been smoking cigars. He has a 15.00 pack-year smoking history. He has never used smokeless tobacco. He reports that he does not drink alcohol or use drugs.   Physical Exam: BP (!) 168/102   Pulse 96   Ht 5' 11"  (1.803 m)   Wt 264 lb (119.7 kg)   BMI 36.82 kg/m   Constitutional:  Alert and oriented, No acute distress. HEENT: [place] AT, moist mucus membranes.  Trachea midline, no masses. Cardiovascular: No clubbing, cyanosis, or edema. Respiratory: Normal respiratory effort, no increased work of breathing. GI: Abdomen is soft, nontender, nondistended, no abdominal masses GU: Phallus without lesions, testes descended bilaterally without masses or tenderness.  Normal size.  Spermatic cord/epididymis palpably normal bilaterally. Skin: No rashes, bruises or suspicious lesions. Neurologic: Grossly intact, no focal deficits, moving all 4 extremities. Psychiatric: Normal mood and affect.   Assessment & Plan:    - Erectile dysfunction Significant organic risk factors.  He failed a trial of sildenafil.  We discussed the efficacy of PDE 5 inhibitors is around 70% across the board however some patients do like to try a second PDE 5 inhibitor.  Prior vacuum erection device was not effective.  Intracavernosal injections were discussed and he was given website literature for review.  He has initially interested in a second PDE 5 inhibitor trial and Rx tadalafil 20 mg was sent to pharmacy.  He was instructed to utilize this medication on at least 5 occasions before efficacy is determined.  He will call back if he desires to pursue intracavernosal injections.   Abbie Sons, [place] [address], [place], [place] [address] [phone]

## 2019-03-19 ENCOUNTER — Ambulatory Visit: Payer: Medicare Other | Admitting: Physician Assistant

## 2019-03-21 ENCOUNTER — Encounter: Payer: Self-pay | Admitting: Urology

## 2019-03-22 ENCOUNTER — Other Ambulatory Visit: Payer: Self-pay

## 2019-03-22 ENCOUNTER — Encounter: Payer: Self-pay | Admitting: Physician Assistant

## 2019-03-22 ENCOUNTER — Ambulatory Visit (INDEPENDENT_AMBULATORY_CARE_PROVIDER_SITE_OTHER): Payer: Medicare Other | Admitting: Physician Assistant

## 2019-03-22 VITALS — BP 155/102 | HR 103 | Temp 97.5°F | Resp 12 | Ht 71.0 in | Wt 252.0 lb

## 2019-03-22 DIAGNOSIS — K611 Rectal abscess: Secondary | ICD-10-CM | POA: Diagnosis not present

## 2019-03-22 NOTE — Patient Instructions (Addendum)
Zach unpacked your wound area. Zach repacked it with 1/4in gauze and placed a dry gauze over the area. Keep packing the area once a day.   We will see you in 2 weeks. See your follow up appointment below.

## 2019-03-22 NOTE — Progress Notes (Signed)
[facility]  03/22/2019  History of Present Illness: Jesse [name] is a 49 y.o. male with a history of right gluteal abscess s/p I&D by the EDP on 02/13. He was admitted to [facility] from 02/13-02/15 and did well  Today, he reports that he is continue to heal well and has no major issues. He denied any pain at the I&D site. He continues to do daily dressing and packing changes with his wife without issue. No fever, chills, nausea, esis, or bowel changes. He has completed ABx. Minimal serous drainage reported. No new issues.    Past Medical History: Past Medical History:  Diagnosis Date  . Coronary artery disease    Inferior myocardial infarction in 2004.  He was treated with thrombolytics at [facility].  He then had an inferior MI in March 2007.  He received a Horizon study stent 3.5 x 20 mm in the distal RCA at [facility].  No other obstructive disease.  . Diabetes mellitus    TYPE II  . Erectile dysfunction   . Hyperlipidemia   . Hypertension   . MI (myocardial infarction) ([place]) 8841,6606  . Morbid obesity ([place])   . Sleep apnea      Past Surgical History: Past Surgical History:  Procedure Laterality Date  . CARDIAC CATHETERIZATION     @ [place]  . LEFT HEART CATH AND CORONARY ANGIOGRAPHY N/A 07/05/2016   Procedure: Left Heart Cath and Coronary Angiography;  Surgeon: Yolonda Kida, MD;  Location: [place] [facility];  Service: Cardiovascular;  Laterality: N/A;  . SEPTOPLASTY      Home Medications: Prior to Admission medications   Medication Sig Start Date End Date Taking? Authorizing Provider  aspirin 81 MG EC tablet Take 1 tablet (81 mg total) by mouth daily. Patient not taking: Reported on 03/06/2019 07/06/16   Gladstone Lighter, MD  atorvastatin (LIPITOR) 40 MG tablet Take 80 mg by mouth daily.     [provider]  blood glucose meter kit and supplies KIT Dispense based on patient and insurance preference. Use up to four times daily  as directed. (FOR ICD-9 250.00, 250.01). 03/08/19   Danford, Suann Larry, MD  clopidogrel (PLAVIX) 75 MG tablet Take 1 tablet (75 mg total) by mouth daily. 07/06/16   Gladstone Lighter, MD  doxycycline (VIBRA-TABS) 100 MG tablet Take 100 mg by mouth 2 (two) times daily. 03/02/19   [provider]  Dulaglutide (TRULICITY) 3.01 SW/1.0XN SOPN Inject 0.75 mg into the skin once a week.    [provider]  HYDROcodone-acetaminophen (NORCO) 10-325 MG tablet Take 1 tablet by mouth every 6 (six) hours as needed (For dressing changes). 03/12/19 03/11/20  Tylene Fantasia, PA-C  insulin aspart (NOVOLOG FLEXPEN) 100 UNIT/ML FlexPen Inject 0-10 Units into the skin 3 (three) times daily with meals. According to separate sliding scale 03/08/19   Danford, Suann Larry, MD  lisinopril (PRINIVIL,ZESTRIL) 10 MG tablet Take 1 tablet (10 mg total) by mouth daily. 07/07/16   Gladstone Lighter, MD  metFORMIN (GLUCOPHAGE) 1000 MG tablet Take 1 tablet (1,000 mg total) by mouth 2 (two) times daily with a meal. 07/06/16   Gladstone Lighter, MD  metoprolol tartrate (LOPRESSOR) 25 MG tablet Take 1 tablet (25 mg total) by mouth 2 (two) times daily. 07/06/16   Gladstone Lighter, MD  nitroGLYCERIN (NITROSTAT) 0.4 MG SL tablet Place 1 tablet (0.4 mg total) under the tongue every 5 (five) minutes as needed for chest pain. 02/28/17   Bettey Costa,  MD  oxyCODONE (ROXICODONE) 5 MG immediate release tablet Take 1 tablet (5 mg total) by mouth every 8 (eight) hours as needed. 03/08/19 03/07/20  Edwin Dada, MD  tadalafil (CIALIS) 20 MG tablet 1 tablet 1 hour prior to intercourse 03/18/19   Stoioff, Ronda Fairly, MD  TRADJENTA 5 MG TABS tablet Take 5 mg by mouth daily. 02/22/17   [provider]  traMADol (ULTRAM) 50 MG tablet Take 50 mg by mouth every 6 (six) hours as needed for pain. 02/13/17   [provider]    Allergies: No Known Allergies  Review of Systems: Review of Systems  Constitutional:  Negative for chills and fever.  Gastrointestinal: Negative for diarrhea, nausea and vomiting.  Skin: Negative for itching and rash.       + Right Gluteal Abscess (Healing)  All other systems reviewed and are negative.   Physical Exam There were no vitals taken for this visit.  Physical Exam Vitals and nursing note reviewed. Exam conducted with a chaperone present.  Constitutional:      General: He is not in acute distress.    Appearance: Normal appearance. He is obese. He is not ill-appearing.  HENT:     Head: Normocephalic and atraumatic.  Eyes:     General: No scleral icterus.    Conjunctiva/sclera: Conjunctivae normal.  Pulmonary:     Effort: Pulmonary effort is normal. No respiratory distress.  Genitourinary:   Musculoskeletal:     Right lower leg: No edema.     Left lower leg: No edema.  Skin:    General: Skin is warm and dry.     Findings: No erythema.  Neurological:     General: No focal deficit present.     Mental Status: He is alert and oriented to person, place, and time.  Psychiatric:        Mood and Affect: Mood normal.        Behavior: Behavior normal.      Labs/Imaging: No new pertinent labs or imaging   Assessment and Plan: This is a 49 y.o. male with a history of right gluteal abscess s/p I&D by the EDP on 02/13   - Packing changed today  - Continue packing daily; reviewed wound care instructions and provided supplies  - No need for additional Abx or surgical intervention  - Follow up in 2 weeks for wound re-check   Face-to-face time spent with the patient and care providers was 15 minutes, with more than 50% of the time spent counseling, educating, and coordinating care of the patient.    -- Edison Simon, PA-C [facility] 03/22/2019, 10:02 AM [phone] M-F: 7am - 4pm

## 2019-04-08 ENCOUNTER — Other Ambulatory Visit: Payer: Self-pay

## 2019-04-08 ENCOUNTER — Ambulatory Visit (INDEPENDENT_AMBULATORY_CARE_PROVIDER_SITE_OTHER): Payer: Medicare Other | Admitting: Physician Assistant

## 2019-04-08 ENCOUNTER — Encounter: Payer: Self-pay | Admitting: Physician Assistant

## 2019-04-08 VITALS — BP 157/99 | HR 93 | Temp 97.3°F | Ht 70.0 in | Wt 259.0 lb

## 2019-04-08 DIAGNOSIS — K611 Rectal abscess: Secondary | ICD-10-CM

## 2019-04-08 NOTE — Patient Instructions (Addendum)
Patient will continue to pack the wound for another week and then change over to normal gauze.   Patient will give our office a call if he notices any drainage, fever, chills or vomit.  Follow-up with our office as needed.  Please call and ask to speak with a nurse if you develop questions or concerns.

## 2019-04-08 NOTE — Progress Notes (Signed)
[facility] NOTE  04/08/2019  History of Present Illness: Jesse [name] is a 49 y.o. male with a history of right gluteal abscess s/p I&D by the EDP on 02/13. He was admitted to [facility] from 02/13-02/15 and did well.   Jesse [name] presents to the clinic today for 2 week wound check of right gluteal abscess. He reports that he is doing well. No issues with pain. The wound continues to heal and his wife is using less and less packing. No redness or drainage reported. No fever or chills at home. No new complaints.   Past Medical History: Past Medical History:  Diagnosis Date  . Coronary artery disease    Inferior myocardial infarction in 2004.  He was treated with thrombolytics at [facility].  He then had an inferior MI in March 2007.  He received a Horizon study stent 3.5 x 20 mm in the distal RCA at [facility].  No other obstructive disease.  . Diabetes mellitus    TYPE II  . Erectile dysfunction   . Hyperlipidemia   . Hypertension   . MI (myocardial infarction) ([place]) 9390,3009  . Morbid obesity ([place])   . Sleep apnea      Past Surgical History: Past Surgical History:  Procedure Laterality Date  . CARDIAC CATHETERIZATION     @ [place]  . LEFT HEART CATH AND CORONARY ANGIOGRAPHY N/A 07/05/2016   Procedure: Left Heart Cath and Coronary Angiography;  Surgeon: Yolonda Kida, MD;  Location: [place] [facility];  Service: Cardiovascular;  Laterality: N/A;  . SEPTOPLASTY      Home Medications: Prior to Admission medications   Medication Sig Start Date End Date Taking? Authorizing Provider  aspirin 81 MG EC tablet Take 1 tablet (81 mg total) by mouth daily. 07/06/16  Yes Gladstone Lighter, MD  atorvastatin (LIPITOR) 40 MG tablet Take 80 mg by mouth daily.    Yes [provider]  blood glucose meter kit and supplies KIT Dispense based on patient and insurance preference. Use up to four times daily as directed. (FOR ICD-9 250.00, 250.01). 03/08/19   Yes Danford, Suann Larry, MD  clopidogrel (PLAVIX) 75 MG tablet Take 1 tablet (75 mg total) by mouth daily. 07/06/16  Yes Gladstone Lighter, MD  Dulaglutide (TRULICITY) 2.33 AQ/7.6AU SOPN Inject 0.75 mg into the skin once a week.   Yes [provider]  insulin aspart (NOVOLOG FLEXPEN) 100 UNIT/ML FlexPen Inject 0-10 Units into the skin 3 (three) times daily with meals. According to separate sliding scale 03/08/19  Yes Danford, Suann Larry, MD  lisinopril (PRINIVIL,ZESTRIL) 10 MG tablet Take 1 tablet (10 mg total) by mouth daily. 07/07/16  Yes Gladstone Lighter, MD  metFORMIN (GLUCOPHAGE) 1000 MG tablet Take 1 tablet (1,000 mg total) by mouth 2 (two) times daily with a meal. 07/06/16  Yes Gladstone Lighter, MD  metoprolol tartrate (LOPRESSOR) 25 MG tablet Take 1 tablet (25 mg total) by mouth 2 (two) times daily. 07/06/16  Yes Gladstone Lighter, MD  nitroGLYCERIN (NITROSTAT) 0.4 MG SL tablet Place 1 tablet (0.4 mg total) under the tongue every 5 (five) minutes as needed for chest pain. 02/28/17  Yes Bettey Costa, MD  tadalafil (CIALIS) 20 MG tablet 1 tablet 1 hour prior to intercourse 03/18/19  Yes Stoioff, Ronda Fairly, MD  TRADJENTA 5 MG TABS tablet Take 5 mg by mouth daily. 02/22/17  Yes [provider]  traMADol (ULTRAM) 50 MG tablet Take 50 mg by mouth every 6 (six) hours  as needed for pain. 02/13/17  Yes [provider]  HYDROcodone-acetaminophen (NORCO) 10-325 MG tablet Take 1 tablet by mouth every 6 (six) hours as needed (For dressing changes). Patient not taking: Reported on 04/08/2019 03/12/19 03/11/20  Tylene Fantasia, PA-C    Allergies: No Known Allergies  Review of Systems: Review of Systems  Constitutional: Negative for chills and fever.  Gastrointestinal: Negative for diarrhea, nausea and vomiting.  Skin: Negative for itching and rash.       + Right Gluteal Abscess (Healing)  All other systems reviewed and are negative.   Physical Exam BP (!) 157/99   Pulse  93   Temp (!) 97.3 F (36.3 C) (Temporal)   Ht 5' 10"  (1.778 m)   Wt 259 lb (117.5 kg)   SpO2 95%   BMI 37.16 kg/m   Physical Exam Vitals and nursing note reviewed. Exam conducted with a chaperone present.  Constitutional:      General: He is not in acute distress.    Appearance: Normal appearance. He is obese. He is not ill-appearing.  HENT:     Head: Normocephalic and atraumatic.  Eyes:     General: No scleral icterus.    Conjunctiva/sclera: Conjunctivae normal.  Pulmonary:     Effort: Pulmonary effort is normal. No respiratory distress.  Genitourinary:   Musculoskeletal:     Right lower leg: No edema.     Left lower leg: No edema.  Skin:    General: Skin is warm and dry.  Neurological:     General: No focal deficit present.     Mental Status: He is alert and oriented to person, place, and time.  Psychiatric:        Mood and Affect: Mood normal.        Behavior: Behavior normal.     Labs/Imaging: No new labs or imaging  Assessment and Plan: This is a 49 y.o. male with a history of right gluteal abscess s/p I&D by the EDP on 02/13   - Packing changed today             - Continue packing daily; suspected he will only need about another week of packing and can then switch to superficial dressings, reviewed wound care instructions and provided supplies             - No need for additional Abx or surgical intervention             - rtc prn  Face-to-face time spent with the patient and care providers was 15 minutes, with more than 50% of the time spent counseling, educating, and coordinating care of the patient.     Edison Simon, PA-C [facility] 04/08/2019, 10:11 AM [phone] M-F: 7am - 4pm

## 2020-05-02 ENCOUNTER — Other Ambulatory Visit: Payer: Self-pay

## 2020-05-02 ENCOUNTER — Emergency Department
Admission: EM | Admit: 2020-05-02 | Discharge: 2020-05-02 | Disposition: A | Discharge status: Home / Self Care | Payer: Medicare Other | Attending: Emergency Medicine | Admitting: Emergency Medicine

## 2020-05-02 DIAGNOSIS — Z7902 Long term (current) use of antithrombotics/antiplatelets: Secondary | ICD-10-CM | POA: Diagnosis not present

## 2020-05-02 DIAGNOSIS — R197 Diarrhea, unspecified: Secondary | ICD-10-CM

## 2020-05-02 DIAGNOSIS — Z79899 Other long term (current) drug therapy: Secondary | ICD-10-CM | POA: Insufficient documentation

## 2020-05-02 DIAGNOSIS — I10 Essential (primary) hypertension: Secondary | ICD-10-CM | POA: Diagnosis not present

## 2020-05-02 DIAGNOSIS — I2510 Atherosclerotic heart disease of native coronary artery without angina pectoris: Secondary | ICD-10-CM | POA: Diagnosis not present

## 2020-05-02 DIAGNOSIS — E860 Dehydration: Secondary | ICD-10-CM

## 2020-05-02 DIAGNOSIS — E1169 Type 2 diabetes mellitus with other specified complication: Secondary | ICD-10-CM | POA: Insufficient documentation

## 2020-05-02 DIAGNOSIS — F17290 Nicotine dependence, other tobacco product, uncomplicated: Secondary | ICD-10-CM | POA: Insufficient documentation

## 2020-05-02 DIAGNOSIS — Z794 Long term (current) use of insulin: Secondary | ICD-10-CM | POA: Diagnosis not present

## 2020-05-02 DIAGNOSIS — Z7982 Long term (current) use of aspirin: Secondary | ICD-10-CM | POA: Diagnosis not present

## 2020-05-02 DIAGNOSIS — E785 Hyperlipidemia, unspecified: Secondary | ICD-10-CM | POA: Diagnosis not present

## 2020-05-02 LAB — CBC
HCT: 55.4 % — ABNORMAL HIGH (ref 39.0–52.0)
Hemoglobin: 18.8 g/dL — ABNORMAL HIGH (ref 13.0–17.0)
MCH: 28.1 pg (ref 26.0–34.0)
MCHC: 33.9 g/dL (ref 30.0–36.0)
MCV: 82.9 fL (ref 80.0–100.0)
Platelets: 351 10*3/uL (ref 150–400)
RBC: 6.68 MIL/uL — ABNORMAL HIGH (ref 4.22–5.81)
RDW: 13.9 % (ref 11.5–15.5)
WBC: 10.8 10*3/uL — ABNORMAL HIGH (ref 4.0–10.5)
nRBC: 0 % (ref 0.0–0.2)

## 2020-05-02 LAB — COMPREHENSIVE METABOLIC PANEL
ALT: 29 U/L (ref 0–44)
AST: 19 U/L (ref 15–41)
Albumin: 4.5 g/dL (ref 3.5–5.0)
Alkaline Phosphatase: 76 U/L (ref 38–126)
Anion gap: 13 (ref 5–15)
BUN: 12 mg/dL (ref 6–20)
CO2: 20 mmol/L — ABNORMAL LOW (ref 22–32)
Calcium: 9.3 mg/dL (ref 8.9–10.3)
Chloride: 102 mmol/L (ref 98–111)
Creatinine, Ser: 0.76 mg/dL (ref 0.61–1.24)
GFR, Estimated: >60 mL/min (ref >60)
Glucose, Bld: 153 mg/dL — ABNORMAL HIGH (ref 70–99)
Potassium: 4 mmol/L (ref 3.5–5.1)
Sodium: 135 mmol/L (ref 135–145)
Total Bilirubin: 0.9 mg/dL (ref 0.3–1.2)
Total Protein: 8.3 g/dL — ABNORMAL HIGH (ref 6.5–8.1)

## 2020-05-02 LAB — URINALYSIS, COMPLETE (UACMP) WITH MICROSCOPIC
Bacteria, UA: NONE SEEN
Bilirubin Urine: NEGATIVE
Glucose, UA: >=500 mg/dL — AB
Ketones, ur: 20 mg/dL — AB
Leukocytes,Ua: NEGATIVE
Nitrite: NEGATIVE
Protein, ur: 100 mg/dL — AB
Specific Gravity, Urine: 1.036 — ABNORMAL HIGH (ref 1.005–1.030)
Squamous Epithelial / HPF: NONE SEEN (ref 0–5)
pH: 5 (ref 5.0–8.0)

## 2020-05-02 LAB — LIPASE, BLOOD: Lipase: 29 U/L (ref 11–51)

## 2020-05-02 LAB — TROPONIN I (HIGH SENSITIVITY): Troponin I (High Sensitivity): 7 ng/L (ref <18)

## 2020-05-02 MED ORDER — LOPERAMIDE HCL 2 MG PO CAPS: 2.0000 mg | ORAL_CAPSULE | ORAL | 0 refills | Status: DC | PRN

## 2020-05-02 MED ORDER — ONDANSETRON 4 MG PO TBDP: 4.0000 mg | ORAL_TABLET | Freq: Three times a day (TID) | ORAL | 0 refills | Status: DC | PRN

## 2020-05-02 MED ORDER — LACTATED RINGERS IV BOLUS
1000.0000 mL | Freq: Once | INTRAVENOUS | Status: AC
  Administered 2020-05-02: 1000 mL via INTRAVENOUS

## 2020-05-02 NOTE — ED Notes (Signed)
IV fluid bolus still infusing. Preparing for discharge once bolus is complete.

## 2020-05-02 NOTE — ED Triage Notes (Signed)
Pt states diarrhea x few days. A&O, ambulatory. C/o abd pain when trying to eat. Think he's dehydrated. Denies vomiting/fever.

## 2020-05-02 NOTE — ED Provider Notes (Signed)
 [facility] Emergency Department Provider Note   ____________________________________________   Event Date/Time   First MD Initiated Contact with Patient 05/02/20 1404     (approximate)  I have reviewed the triage vital signs and the nursing notes.   HISTORY  Chief Complaint Diarrhea    HPI Jesse [name] is a 49 y.o. male with past medical history of hypertension, hyperlipidemia, diabetes, and CAD who presents the ED complaining of diarrhea.  Patient reports that he has had 4 to 5 days of persistent watery stools, denies blood in his stool.  He has not had any nausea or vomiting, does have occasional abdominal cramping but currently denies any abdominal pain.  He has not had any fevers and denies recent travel.  He does state that he had an episode of very brief sharp pain in the center of his chest this morning, that resolved after less than 1 minute.  He has had no further chest pain and denies any cough or shortness of breath.  His wife was concerned that his electrolytes could be off and he was dehydrated, causing issues with his heart.        Past Medical History:  Diagnosis Date  . Coronary artery disease    Inferior myocardial infarction in 2004.  He was treated with thrombolytics at [facility].  He then had an inferior MI in March 2007.  He received a Horizon study stent 3.5 x 20 mm in the distal RCA at [place].  No other obstructive disease.  . Diabetes mellitus    TYPE II  . Erectile dysfunction   . Hyperlipidemia   . Hypertension   . MI (myocardial infarction) (HCC) 2004,2007  . Morbid obesity (HCC)   . Sleep apnea     Patient Active Problem List   Diagnosis Date Noted  . Perirectal abscess 03/06/2019  . NSTEMI (non-ST elevated myocardial infarction) (HCC) 02/28/2017  . STEMI involving right coronary artery (HCC) 07/05/2016  . Ischemic cardiomyopathy 07/05/2016  . Polysubstance abuse (HCC) 07/05/2016  . H/O noncompliance with medical  treatment, presenting hazards to health 07/05/2016  . Poorly controlled diabetes mellitus (HCC) 07/05/2016  . DYSPNEA 09/28/2008  . Hyperlipemia 08/13/2008  . Obese 08/13/2008  . Essential hypertension 08/13/2008  . CAD, NATIVE VESSEL 08/13/2008    Past Surgical History:  Procedure Laterality Date  . CARDIAC CATHETERIZATION     @ [facility]  . LEFT HEART CATH AND CORONARY ANGIOGRAPHY N/A 07/05/2016   Procedure: Left Heart Cath and Coronary Angiography;  Surgeon: Callwood, Dwayne D, MD;  Location: [facility] INVASIVE CV LAB;  Service: Cardiovascular;  Laterality: N/A;  . SEPTOPLASTY      Prior to Admission medications   Medication Sig Start Date End Date Taking? Authorizing Provider  loperamide (IMODIUM) 2 MG capsule Take 1 capsule (2 mg total) by mouth as needed for diarrhea or loose stools. 05/02/20  Yes Jessup, Charles, MD  ondansetron (ZOFRAN ODT) 4 MG disintegrating tablet Take 1 tablet (4 mg total) by mouth every 8 (eight) hours as needed for nausea or vomiting. 05/02/20  Yes Jessup, Charles, MD  aspirin 81 MG EC tablet Take 1 tablet (81 mg total) by mouth daily. 07/06/16   Kalisetti, Radhika, MD  atorvastatin (LIPITOR) 40 MG tablet Take 80 mg by mouth daily.     [provider]  blood glucose meter kit and supplies KIT Dispense based on patient and insurance preference. Use up to four times daily as directed. (FOR ICD-9 250.00, 250.01). 03/08/19     Danford, Suann Larry, MD  clopidogrel (PLAVIX) 75 MG tablet Take 1 tablet (75 mg total) by mouth daily. 07/06/16   Gladstone Lighter, MD  Dulaglutide (TRULICITY) 6.56 CL/2.7NT SOPN Inject 0.75 mg into the skin once a week.    [provider]  insulin aspart (NOVOLOG FLEXPEN) 100 UNIT/ML FlexPen Inject 0-10 Units into the skin 3 (three) times daily with meals. According to separate sliding scale 03/08/19   Danford, Suann Larry, MD  lisinopril (PRINIVIL,ZESTRIL) 10 MG tablet Take 1 tablet (10 mg total) by mouth daily. 07/07/16    Gladstone Lighter, MD  metFORMIN (GLUCOPHAGE) 1000 MG tablet Take 1 tablet (1,000 mg total) by mouth 2 (two) times daily with a meal. 07/06/16   Gladstone Lighter, MD  metoprolol tartrate (LOPRESSOR) 25 MG tablet Take 1 tablet (25 mg total) by mouth 2 (two) times daily. 07/06/16   Gladstone Lighter, MD  nitroGLYCERIN (NITROSTAT) 0.4 MG SL tablet Place 1 tablet (0.4 mg total) under the tongue every 5 (five) minutes as needed for chest pain. 02/28/17   Bettey Costa, MD  tadalafil (CIALIS) 20 MG tablet 1 tablet 1 hour prior to intercourse 03/18/19   Stoioff, Ronda Fairly, MD  TRADJENTA 5 MG TABS tablet Take 5 mg by mouth daily. 02/22/17   [provider]  traMADol (ULTRAM) 50 MG tablet Take 50 mg by mouth every 6 (six) hours as needed for pain. 02/13/17   [provider]    Allergies Patient has no known allergies.  Family History  Adopted: Yes    Social History Social History   Tobacco Use  . Smoking status: Current Every Day Smoker    Packs/day: 1.00    Years: 15.00    Pack years: 15.00    Types: Cigars  . Smokeless tobacco: Never Used  Vaping Use  . Vaping Use: Never used  Substance Use Topics  . Alcohol use: No  . Drug use: No    Comment: prior hx of cocaine and marijuana    Review of Systems  Constitutional: No fever/chills Eyes: No visual changes. ENT: No sore throat. Cardiovascular: Positive for chest pain. Respiratory: Denies shortness of breath. Gastrointestinal: No abdominal pain.  No nausea, no vomiting.  Positive for diarrhea.  No constipation. Genitourinary: Negative for dysuria. Musculoskeletal: Negative for back pain. Skin: Negative for rash. Neurological: Negative for headaches, focal weakness or numbness.  ____________________________________________   PHYSICAL EXAM:  VITAL SIGNS: ED Triage Vitals  Enc Vitals Group     BP 05/02/20 1338 (!) 142/96     Pulse Rate 05/02/20 1336 (!) 126     Resp 05/02/20 1336 18     Temp 05/02/20 1336 97.7 F  (36.5 C)     Temp Source 05/02/20 1336 Oral     SpO2 05/02/20 1336 98 %     Weight 05/02/20 1337 248 lb (112.5 kg)     Height 05/02/20 1337 5' 11" (1.803 m)     Head Circumference --      Peak Flow --      Pain Score 05/02/20 1337 4     Pain Loc --      Pain Edu? --      Excl. in [place]? --     Constitutional: Alert and oriented. Eyes: Conjunctivae are normal. Head: Atraumatic. Nose: No congestion/rhinnorhea. Mouth/Throat: Mucous membranes are moist. Neck: Normal ROM Cardiovascular: Normal rate, regular rhythm. Grossly normal heart sounds.  2+ radial pulses bilaterally. Respiratory: Normal respiratory effort.  No retractions. Lungs CTAB. Gastrointestinal: Soft and  nontender. No distention. Genitourinary: deferred Musculoskeletal: No lower extremity tenderness nor edema. Neurologic:  Normal speech and language. No gross focal neurologic deficits are appreciated. Skin:  Skin is warm, dry and intact. No rash noted. Psychiatric: Mood and affect are normal. Speech and behavior are normal.  ____________________________________________   LABS (all labs ordered are listed, but only abnormal results are displayed)  Labs Reviewed  COMPREHENSIVE METABOLIC PANEL - Abnormal; Notable for the following components:      Result Value   CO2 20 (*)    Glucose, Bld 153 (*)    Total Protein 8.3 (*)    All other components within normal limits  CBC - Abnormal; Notable for the following components:   WBC 10.8 (*)    RBC 6.68 (*)    Hemoglobin 18.8 (*)    HCT 55.4 (*)    All other components within normal limits  URINALYSIS, COMPLETE (UACMP) WITH MICROSCOPIC - Abnormal; Notable for the following components:   Color, Urine YELLOW (*)    APPearance CLEAR (*)    Specific Gravity, Urine 1.036 (*)    Glucose, UA >=500 (*)    Hgb urine dipstick SMALL (*)    Ketones, ur 20 (*)    Protein, ur 100 (*)    All other components within normal limits  LIPASE, BLOOD  TROPONIN I (HIGH SENSITIVITY)    ____________________________________________  EKG  ED ECG REPORT I, Blake Divine, the attending physician, personally viewed and interpreted this ECG.   Date: 05/02/2020  EKG Time: 13:42  Rate: 107  Rhythm: sinus tachycardia  Axis: RAD  Intervals:none  ST&T Change: Inferior T wave inversions with Q waves, similar to previous   PROCEDURES  Procedure(s) performed (including Critical Care):  Procedures   ____________________________________________   INITIAL IMPRESSION / ASSESSMENT AND PLAN / ED COURSE       50 year old male with past medical history of hypertension, hyperlipidemia, diabetes, and CAD who presents to the ED for a few days of diarrhea associated with a fleeting episode of chest pain earlier today.  Patient denies any current chest pain and episode described sounds are atypical for ACS.  EKG appears similar to previous with no acute ischemic changes, we will screen troponin.  Lab work is unremarkable, UA does show ketones consistent with mild dehydration.  We will hydrate with IV fluids and reassess, no abdominal tenderness noted on exam and suspect gastroenteritis.  Patient feeling better following IV fluids, labs are unremarkable.  Troponin within normal limits and patient with no further chest pain here in the ED.  Patient is appropriate for discharge home with prescriptions for loperamide and Zofran, was counseled to return to the ED for new or worsening symptoms.  Patient agrees with plan.      ____________________________________________   FINAL CLINICAL IMPRESSION(S) / ED DIAGNOSES  Final diagnoses:  Diarrhea, unspecified type  Dehydration     ED Discharge Orders         Ordered    loperamide (IMODIUM) 2 MG capsule  As needed        05/02/20 1551    ondansetron (ZOFRAN ODT) 4 MG disintegrating tablet  Every 8 hours PRN        05/02/20 1551           Note:  This document was prepared using Dragon voice recognition software and may  include unintentional dictation errors.   Blake Divine, MD 05/02/20 [phone]

## 2020-06-13 ENCOUNTER — Other Ambulatory Visit: Payer: Self-pay | Admitting: Family Medicine

## 2020-09-26 ENCOUNTER — Emergency Department
Admission: EM | Admit: 2020-09-26 | Discharge: 2020-09-26 | Disposition: A | Discharge status: Home / Self Care | Payer: Medicare Other | Attending: Emergency Medicine | Admitting: Emergency Medicine

## 2020-09-26 ENCOUNTER — Other Ambulatory Visit: Payer: Self-pay

## 2020-09-26 ENCOUNTER — Emergency Department: Payer: Medicare Other

## 2020-09-26 DIAGNOSIS — E119 Type 2 diabetes mellitus without complications: Secondary | ICD-10-CM | POA: Diagnosis not present

## 2020-09-26 DIAGNOSIS — I2510 Atherosclerotic heart disease of native coronary artery without angina pectoris: Secondary | ICD-10-CM | POA: Diagnosis not present

## 2020-09-26 DIAGNOSIS — R630 Anorexia: Secondary | ICD-10-CM | POA: Diagnosis not present

## 2020-09-26 DIAGNOSIS — Z7902 Long term (current) use of antithrombotics/antiplatelets: Secondary | ICD-10-CM | POA: Diagnosis not present

## 2020-09-26 DIAGNOSIS — R5383 Other fatigue: Secondary | ICD-10-CM | POA: Insufficient documentation

## 2020-09-26 DIAGNOSIS — Z20822 Contact with and (suspected) exposure to covid-19: Secondary | ICD-10-CM | POA: Insufficient documentation

## 2020-09-26 DIAGNOSIS — Z7984 Long term (current) use of oral hypoglycemic drugs: Secondary | ICD-10-CM | POA: Insufficient documentation

## 2020-09-26 DIAGNOSIS — Z79899 Other long term (current) drug therapy: Secondary | ICD-10-CM | POA: Diagnosis not present

## 2020-09-26 DIAGNOSIS — Z7982 Long term (current) use of aspirin: Secondary | ICD-10-CM | POA: Insufficient documentation

## 2020-09-26 DIAGNOSIS — Z794 Long term (current) use of insulin: Secondary | ICD-10-CM | POA: Insufficient documentation

## 2020-09-26 DIAGNOSIS — F17210 Nicotine dependence, cigarettes, uncomplicated: Secondary | ICD-10-CM | POA: Diagnosis not present

## 2020-09-26 DIAGNOSIS — I10 Essential (primary) hypertension: Secondary | ICD-10-CM | POA: Diagnosis not present

## 2020-09-26 DIAGNOSIS — R140 Abdominal distension (gaseous): Secondary | ICD-10-CM | POA: Diagnosis not present

## 2020-09-26 LAB — URINALYSIS, COMPLETE (UACMP) WITH MICROSCOPIC
Bilirubin Urine: NEGATIVE
Glucose, UA: 500 mg/dL — AB
Ketones, ur: 40 mg/dL — AB
Leukocytes,Ua: NEGATIVE
Nitrite: NEGATIVE
Protein, ur: 100 mg/dL — AB
Specific Gravity, Urine: 1.01 (ref 1.005–1.030)
Squamous Epithelial / HPF: NONE SEEN (ref 0–5)
pH: 5 (ref 5.0–8.0)

## 2020-09-26 LAB — COMPREHENSIVE METABOLIC PANEL
ALT: 22 U/L (ref 0–44)
AST: 19 U/L (ref 15–41)
Albumin: 4.2 g/dL (ref 3.5–5.0)
Alkaline Phosphatase: 69 U/L (ref 38–126)
Anion gap: 10 (ref 5–15)
BUN: 12 mg/dL (ref 6–20)
CO2: 24 mmol/L (ref 22–32)
Calcium: 8.9 mg/dL (ref 8.9–10.3)
Chloride: 103 mmol/L (ref 98–111)
Creatinine, Ser: 0.57 mg/dL — ABNORMAL LOW (ref 0.61–1.24)
GFR, Estimated: >60 mL/min (ref >60)
Glucose, Bld: 170 mg/dL — ABNORMAL HIGH (ref 70–99)
Potassium: 3.8 mmol/L (ref 3.5–5.1)
Sodium: 137 mmol/L (ref 135–145)
Total Bilirubin: 1 mg/dL (ref 0.3–1.2)
Total Protein: 7.5 g/dL (ref 6.5–8.1)

## 2020-09-26 LAB — CBC
HCT: 54.2 % — ABNORMAL HIGH (ref 39.0–52.0)
Hemoglobin: 18.2 g/dL — ABNORMAL HIGH (ref 13.0–17.0)
MCH: 28.3 pg (ref 26.0–34.0)
MCHC: 33.6 g/dL (ref 30.0–36.0)
MCV: 84.2 fL (ref 80.0–100.0)
Platelets: 312 10*3/uL (ref 150–400)
RBC: 6.44 MIL/uL — ABNORMAL HIGH (ref 4.22–5.81)
RDW: 13.5 % (ref 11.5–15.5)
WBC: 11.9 10*3/uL — ABNORMAL HIGH (ref 4.0–10.5)
nRBC: 0 % (ref 0.0–0.2)

## 2020-09-26 LAB — RESP PANEL BY RT-PCR (FLU A&B, COVID) ARPGX2
Influenza A by PCR: NEGATIVE
Influenza B by PCR: NEGATIVE
SARS Coronavirus 2 by RT PCR: NEGATIVE

## 2020-09-26 LAB — TROPONIN I (HIGH SENSITIVITY): Troponin I (High Sensitivity): 9 ng/L (ref <18)

## 2020-09-26 LAB — LIPASE, BLOOD: Lipase: 28 U/L (ref 11–51)

## 2020-09-26 LAB — CBG MONITORING, ED: Glucose-Capillary: 194 mg/dL — ABNORMAL HIGH (ref 70–99)

## 2020-09-26 MED ORDER — SODIUM CHLORIDE 0.9 % IV BOLUS
1000.0000 mL | Freq: Once | INTRAVENOUS | Status: AC
  Administered 2020-09-26: 1000 mL via INTRAVENOUS

## 2020-09-26 MED ORDER — ONDANSETRON 4 MG PO TBDP
4.0000 mg | ORAL_TABLET | Freq: Once | ORAL | Status: AC
  Administered 2020-09-26: 4 mg via ORAL
  Filled 2020-09-26: qty 1

## 2020-09-26 NOTE — ED Triage Notes (Signed)
Pt c/o generalized abd pain with nausea for the past 3 days and in the past 24hrs having some abd pain, states they recently increased his diabetes meds and not sure if it is related to that

## 2020-09-26 NOTE — Discharge Instructions (Addendum)
Your blood work, EKG and x-ray were all reassuring.  Your COVID test was negative.  If you continue to feel poorly, please follow-up with your primary care physician.  If you have any new symptoms that are concerning to you, you can return to the emergency department.

## 2020-09-26 NOTE — ED Provider Notes (Signed)
[facility]  ____________________________________________   Event Date/Time   First MD Initiated Contact with Patient 09/26/20 1053     (approximate)  I have reviewed the triage vital signs and the nursing notes.   HISTORY  Chief Complaint Abdominal Pain    HPI LAWERENCE DERY is a 50 y.o. male past medical history of CAD, hypertension, hyperlipidemia, diabetes who presents with generalized fatigue.  Symptoms started several days ago.  He endorses decreased appetite, abdominal distention and bloating as well as overall fatigue.  He denies fevers or chills.  Denies cough.  He smokes cigars and did have some shortness of breath yesterday after he smoked but denies any ongoing dyspnea.  He denies chest pain.  He was initially feeling constipated and had some diarrhea today.  Denies urinary symptoms.  He recently was started back on Trulicity after being off it for several weeks at a higher dose, is not sure if this symptoms are related to this.         Past Medical History:  Diagnosis Date   Coronary artery disease    Inferior myocardial infarction in 2004.  He was treated with thrombolytics at [facility].  He then had an inferior MI in March 2007.  He received a Horizon study stent 3.5 x 20 mm in the distal RCA at [facility].  No other obstructive disease.   Diabetes mellitus    TYPE II   Erectile dysfunction    Hyperlipidemia    Hypertension    MI (myocardial infarction) ([place]) 9833,8250   Morbid obesity ([place])    Sleep apnea     Patient Active Problem List   Diagnosis Date Noted   Perirectal abscess 03/06/2019   NSTEMI (non-ST elevated myocardial infarction) ([place]) 02/28/2017   STEMI involving right coronary artery ([place]) 07/05/2016   Ischemic cardiomyopathy 07/05/2016   Polysubstance abuse ([place]) 07/05/2016   H/O noncompliance with medical treatment, presenting hazards to health 07/05/2016   Poorly controlled diabetes mellitus ([place]) 07/05/2016   DYSPNEA  09/28/2008   Hyperlipemia 08/13/2008   Obese 08/13/2008   Essential hypertension 08/13/2008   CAD, NATIVE VESSEL 08/13/2008    Past Surgical History:  Procedure Laterality Date   CARDIAC CATHETERIZATION     @ [facility]   LEFT HEART CATH AND CORONARY ANGIOGRAPHY N/A 07/05/2016   Procedure: Left Heart Cath and Coronary Angiography;  Surgeon: Yolonda Kida, MD;  Location: [place] CV LAB;  Service: Cardiovascular;  Laterality: N/A;   SEPTOPLASTY      Prior to Admission medications   Medication Sig Start Date End Date Taking? Authorizing Provider  aspirin 81 MG EC tablet Take 1 tablet (81 mg total) by mouth daily. 07/06/16   Gladstone Lighter, MD  atorvastatin (LIPITOR) 40 MG tablet Take 80 mg by mouth daily.     [provider]  blood glucose meter kit and supplies KIT Dispense based on patient and insurance preference. Use up to four times daily as directed. (FOR ICD-9 250.00, 250.01). 03/08/19   Danford, Suann Larry, MD  clopidogrel (PLAVIX) 75 MG tablet Take 1 tablet (75 mg total) by mouth daily. 07/06/16   Gladstone Lighter, MD  Dulaglutide (TRULICITY) 5.39 JQ/7.3AL SOPN Inject 0.75 mg into the skin once a week.    [provider]  insulin aspart (NOVOLOG FLEXPEN) 100 UNIT/ML FlexPen Inject 0-10 Units into the skin 3 (three) times daily with meals. According to separate sliding scale 03/08/19   Danford, Suann Larry, MD  lisinopril (PRINIVIL,ZESTRIL) 10 MG  tablet Take 1 tablet (10 mg total) by mouth daily. 07/07/16   Gladstone Lighter, MD  loperamide (IMODIUM) 2 MG capsule Take 1 capsule (2 mg total) by mouth as needed for diarrhea or loose stools. 05/02/20   Blake Divine, MD  metFORMIN (GLUCOPHAGE) 1000 MG tablet Take 1 tablet (1,000 mg total) by mouth 2 (two) times daily with a meal. 07/06/16   Gladstone Lighter, MD  metoprolol tartrate (LOPRESSOR) 25 MG tablet Take 1 tablet (25 mg total) by mouth 2 (two) times daily. 07/06/16   Gladstone Lighter, MD   nitroGLYCERIN (NITROSTAT) 0.4 MG SL tablet Place 1 tablet (0.4 mg total) under the tongue every 5 (five) minutes as needed for chest pain. 02/28/17   Bettey Costa, MD  ondansetron (ZOFRAN ODT) 4 MG disintegrating tablet Take 1 tablet (4 mg total) by mouth every 8 (eight) hours as needed for nausea or vomiting. 05/02/20   Blake Divine, MD  tadalafil (CIALIS) 20 MG tablet 1 tablet 1 hour prior to intercourse 03/18/19   Stoioff, Ronda Fairly, MD  TRADJENTA 5 MG TABS tablet Take 5 mg by mouth daily. 02/22/17   [provider]  traMADol (ULTRAM) 50 MG tablet Take 50 mg by mouth every 6 (six) hours as needed for pain. 02/13/17   [provider]    Allergies Patient has no known allergies.  Family History  Adopted: Yes    Social History Social History   Tobacco Use   Smoking status: Every Day    Packs/day: 1.00    Years: 15.00    Pack years: 15.00    Types: Cigars, Cigarettes   Smokeless tobacco: Never  Vaping Use   Vaping Use: Never used  Substance Use Topics   Alcohol use: No   Drug use: No    Comment: prior hx of cocaine and marijuana    Review of Systems   Review of Systems  Constitutional:  Positive for activity change, appetite change and fatigue. Negative for chills.  HENT:  Negative for congestion.   Respiratory:  Negative for cough, chest tightness and shortness of breath.   Cardiovascular:  Negative for chest pain, palpitations and leg swelling.  Gastrointestinal:  Positive for abdominal distention, diarrhea and nausea. Negative for abdominal pain and vomiting.  Genitourinary:  Negative for dysuria.  Musculoskeletal:  Negative for myalgias.  Neurological:  Negative for headaches.  All other systems reviewed and are negative.  Physical Exam Updated Vital Signs BP 122/90   Pulse 78   Temp 97.9 F (36.6 C) (Oral)   Resp 20   Ht 5' 11"  (1.803 m)   Wt 113.4 kg   SpO2 95%   BMI 34.87 kg/m   Physical Exam Vitals and nursing note reviewed.   Constitutional:      General: He is not in acute distress.    Appearance: Normal appearance. He is well-developed.  HENT:     Head: Normocephalic and atraumatic.     Mouth/Throat:     Comments: Dry mucous membranes Eyes:     General: No scleral icterus.    Conjunctiva/sclera: Conjunctivae normal.  Pulmonary:     Effort: Pulmonary effort is normal. No respiratory distress.     Breath sounds: Normal breath sounds. No wheezing.  Abdominal:     General: Abdomen is flat.     Palpations: Abdomen is soft.     Tenderness: There is no abdominal tenderness.  Musculoskeletal:        General: No deformity or signs of injury.  Cervical back: Normal range of motion.  Skin:    Coloration: Skin is not jaundiced or pale.  Neurological:     General: No focal deficit present.     Mental Status: He is alert and oriented to person, place, and time. Mental status is at baseline.  Psychiatric:        Mood and Affect: Mood normal.        Behavior: Behavior normal.     LABS (all labs ordered are listed, but only abnormal results are displayed)  Labs Reviewed  COMPREHENSIVE METABOLIC PANEL - Abnormal; Notable for the following components:      Result Value   Glucose, Bld 170 (*)    Creatinine, Ser 0.57 (*)    All other components within normal limits  CBC - Abnormal; Notable for the following components:   WBC 11.9 (*)    RBC 6.44 (*)    Hemoglobin 18.2 (*)    HCT 54.2 (*)    All other components within normal limits  URINALYSIS, COMPLETE (UACMP) WITH MICROSCOPIC - Abnormal; Notable for the following components:   Glucose, UA 500 (*)    Hgb urine dipstick TRACE (*)    Ketones, ur 40 (*)    Protein, ur 100 (*)    Bacteria, UA RARE (*)    All other components within normal limits  CBG MONITORING, ED - Abnormal; Notable for the following components:   Glucose-Capillary 194 (*)    All other components within normal limits  RESP PANEL BY RT-PCR (FLU A&B, COVID) ARPGX2  LIPASE, BLOOD   TROPONIN I (HIGH SENSITIVITY)   ____________________________________________  EKG  Normal sinus rhythm, normal intervals, normal axis, T wave inversions in lead III and aVF, similar to prior, no acute ischemic changes ____________________________________________  RADIOLOGY I, Madelin Headings, personally viewed and evaluated these images (plain radiographs) as part of my medical decision making, as well as reviewing the written report by the radiologist.  ED MD interpretation: I reviewed the chest x-ray which shows some increased interstitial markings no focal infiltrate    ____________________________________________   PROCEDURES  Procedure(s) performed (including Critical Care):  Procedures   ____________________________________________   INITIAL IMPRESSION / ASSESSMENT AND PLAN / ED COURSE     Patient is a 50 year old male who presents with several symptoms including fatigue abdominal distention poor appetite.  Vital signs are notable for mild tachycardia otherwise within normal limits.  He appears somewhat dry on exam but abdomen is benign without tenderness.  Screening labs obtained in triage are reassuring, does appear somewhat hemoconcentrated with a hemoglobin of 18 and ketones in his urine so will give a liter of fluids.  We will also check a troponin and EKG given his risk factors and vague fatigue to ensure that this is not an anginal equivalent.  Anticipate discharge if he is ruled out for cardiac ischemia.  Patient's EKG is within normal limits and his troponin is negative.  Patient is stable for discharge.  [place] following up with his primary care provider.  Return precautions discussed as well.      ____________________________________________   FINAL CLINICAL IMPRESSION(S) / ED DIAGNOSES  Final diagnoses:  Fatigue, unspecified type     ED Discharge Orders     None        Note:  This document was prepared using Dragon voice recognition  software and may include unintentional dictation errors.    Rada Hay, MD 09/26/20 [phone]

## 2021-02-07 ENCOUNTER — Encounter: Admission: RE | Payer: Self-pay | Source: Home / Self Care

## 2021-02-07 ENCOUNTER — Ambulatory Visit: Admission: RE | Admit: 2021-02-07 | Payer: Medicare Other | Source: Home / Self Care | Admitting: Internal Medicine

## 2021-02-07 SURGERY — COLONOSCOPY
Anesthesia: General

## 2021-04-18 ENCOUNTER — Ambulatory Visit: Admission: RE | Admit: 2021-04-18 | Payer: Medicare Other | Source: Home / Self Care | Admitting: Internal Medicine

## 2021-04-18 ENCOUNTER — Encounter: Admission: RE | Payer: Self-pay | Source: Home / Self Care

## 2021-04-18 SURGERY — COLONOSCOPY WITH PROPOFOL
Anesthesia: General

## 2021-04-19 ENCOUNTER — Ambulatory Visit (INDEPENDENT_AMBULATORY_CARE_PROVIDER_SITE_OTHER): Payer: Medicare Other | Admitting: Urology

## 2021-04-19 ENCOUNTER — Encounter: Payer: Self-pay | Admitting: Urology

## 2021-04-19 VITALS — BP 160/95 | HR 92 | Ht 71.0 in | Wt 250.0 lb

## 2021-04-19 DIAGNOSIS — N5201 Erectile dysfunction due to arterial insufficiency: Secondary | ICD-10-CM

## 2021-04-19 MED ORDER — AMBULATORY NON FORMULARY MEDICATION: 0 refills | Status: DC

## 2021-04-20 ENCOUNTER — Encounter: Payer: Self-pay | Admitting: Urology

## 2021-04-20 NOTE — Progress Notes (Signed)
? ?04/19/2021 ?7:39 AM  ? ?Jesse [name] ?10-23-1970 ?[id number] ? ?Referring provider: [name], MD ?[place] ?[place],  [place] [address] ? ?Chief Complaint  ?Patient presents with  ? Follow-up  ? ? ?HPI: ?51 y.o. male presents for follow-up of ED. ? ?Initially seen 03/18/2019 ?Significant organic risk factors ?Has failed PDE 5 inhibitors and vacuum erection device ?Interested in pursuing intracavernosal injections ? ? ?PMH: ?Past Medical History:  ?Diagnosis Date  ? Coronary artery disease   ? Inferior myocardial infarction in 2004.  He was treated with thrombolytics at [facility].  He then had an inferior MI in March 2007.  He received a Horizon study stent 3.5 x 20 mm in the distal RCA at [facility]  No other obstructive disease.  ? Diabetes mellitus   ? TYPE II  ? Erectile dysfunction   ? Hyperlipidemia   ? Hypertension   ? MI (myocardial infarction) ([place]) [id number]  ? Morbid obesity ([place])   ? Sleep apnea   ? ? ?Surgical History: ?Past Surgical History:  ?Procedure Laterality Date  ? CARDIAC CATHETERIZATION    ? @ [facility]  ? LEFT HEART CATH AND CORONARY ANGIOGRAPHY N/A 07/05/2016  ? Procedure: Left Heart Cath and Coronary Angiography;  Surgeon: Yolonda Kida, MD;  Location: [facility];  Service: Cardiovascular;  Laterality: N/A;  ? SEPTOPLASTY    ? ? ?Home Medications:  ?Allergies as of 04/19/2021   ?No Known Allergies ?  ? ?  ?Medication List  ?  ? ?  ? Accurate as of April 19, 2021 11:59 PM. If you have any questions, ask your nurse or doctor.  ?  ?  ? ?  ? ?STOP taking these medications   ? ?metFORMIN 1000 MG tablet ?Commonly known as: GLUCOPHAGE ?Stopped by: Abbie Sons, MD ?  ?NovoLOG FlexPen 100 UNIT/ML FlexPen ?Generic drug: insulin aspart ?Stopped by: Abbie Sons, MD ?  ? ?  ? ?TAKE these medications   ? ?AMBULATORY NON FORMULARY MEDICATION ?Trimix (30/1/10)-(Pap/Phent/PGE) ? ?Test Dose  4m vial  ? ?Qty #3 Refills 0 ? ?[facility] ?[phone]?Fax  [phone]?Started by: SAbbie Sons MD ?  ?aspirin 81 MG EC tablet ?Take 1 tablet (81 mg total) by mouth daily. ?  ?atorvastatin 40 MG tablet ?Commonly known as: LIPITOR ?Take 80 mg by mouth daily. ?  ?blood glucose meter kit and supplies Kit ?Dispense based on patient and insurance preference. Use up to four times daily as directed. (FOR ICD-9 250.00, 250.01). ?  ?clopidogrel 75 MG tablet ?Commonly known as: PLAVIX ?Take 1 tablet (75 mg total) by mouth daily. ?  ?Dulaglutide 0.75 MG/0.5ML Sopn ?Inject 0.75 mg into the skin once a week. ?  ?lisinopril 10 MG tablet ?Commonly known as: ZESTRIL ?Take 1 tablet (10 mg total) by mouth daily. ?  ?loperamide 2 MG capsule ?Commonly known as: IMODIUM ?Take 1 capsule (2 mg total) by mouth as needed for diarrhea or loose stools. ?  ?metoprolol tartrate 25 MG tablet ?Commonly known as: LOPRESSOR ?Take 1 tablet (25 mg total) by mouth 2 (two) times daily. ?  ?nitroGLYCERIN 0.4 MG SL tablet ?Commonly known as: NITROSTAT ?Place 1 tablet (0.4 mg total) under the tongue every 5 (five) minutes as needed for chest pain. ?  ?ondansetron 4 MG disintegrating tablet ?Commonly known as: Zofran ODT ?Take 1 tablet (4 mg total) by mouth every 8 (eight) hours as needed for nausea or vomiting. ?  ?tadalafil 20 MG  tablet ?Commonly known as: CIALIS ?1 tablet 1 hour prior to intercourse ?  ?Tradjenta 5 MG Tabs tablet ?Generic drug: linagliptin ?Take 5 mg by mouth daily. ?  ?traMADol 50 MG tablet ?Commonly known as: ULTRAM ?Take 50 mg by mouth every 6 (six) hours as needed for pain. ?  ? ?  ? ? ?Allergies: No Known Allergies ? ?Family History: ?Family History  ?Adopted: Yes  ? ? ?Social History:  reports that he has been smoking cigars and cigarettes. He has a 15.00 pack-year smoking history. He has never used smokeless tobacco. He reports that he does not drink alcohol and does not use drugs. ? ? ?Physical Exam: ?BP (!) 160/95   Pulse 92   Ht 5' 11"  (1.803 m)   Wt 250 lb (113.4 kg)   BMI  34.87 kg/m?   ?Constitutional:  Alert and oriented, No acute distress. ?HEENT: [place] AT, moist mucus membranes.  Trachea midline, no masses. ?Cardiovascular: No clubbing, cyanosis, or edema. ?Respiratory: Normal respiratory effort, no increased work of breathing. ?Psychiatric: Normal mood and affect. ? ? ?Assessment & Plan:   ? ?1.  Erectile dysfunction ?Refractory to PDE 5 inhibitor therapy and has tried a vacuum erection device ?Desires to start intracavernosal injections ?We discussed potential complications including priapism and corporal scarring ?He was provided literature on injections and will schedule a PA appointment for training/dosing ? ? ?Abbie Sons, MD ? ?[facility] ?[address] ?[place], [place] [address] ?[phone] ? ?

## 2021-05-16 NOTE — Progress Notes (Incomplete)
? ?  05/16/2021 ?10:41 AM ? ?Jesse [name] ?[birth year] ?[id number] ? ? ?Referring provider: Leanna Sato, MD ?[address] ?[place],  [place] [address] ? ? ?No chief complaint on file. ? ?Urological history  ?1.  Erectile dysfunction ?- Refractory to PDE 5 inhibitor therapy and has tried a vacuum erection device ? ?HPI: ?Jesse [name] is a 51 y.o. male who presents today for Trimix injection teaching.  ? ? ?Physical Exam:  ?There were no vitals taken for this visit.  ?Constitutional:  Well nourished. Alert and oriented, No acute distress. ?GU: No CVA tenderness.  No bladder fullness or masses.  Patient with circumcised/uncircumcised phallus. ***Foreskin easily retracted***  Urethral meatus is patent.  No penile discharge. No penile lesions or rashes. Scrotum without lesions, cysts, rashes and/or edema.  Testicles are located scrotally bilaterally. No masses are appreciated in the testicles. Left and right epididymis are normal. ?Psychiatric: Normal mood and affect. ? ? ?Procedure *** ?Patient's left corpus cavernosum is identified.  An area near the base of the penis is cleansed with rubbing alcohol.  Careful to avoid the dorsal vein, *** mcg of Trimix (papaverine 30 mg, phentolamine 1 mg and prostaglandin E1 10 mcg, Lot # ***@*** exp # *** is injected at a 90 degree angle into the left *** corpus cavernosum near the base of the penis.  Patient experienced a very firm erection in 15 minutes.   ? ? ?Assessment & Plan:   ? ?1.  ? ? ?  ?No follow-ups on file. ? ?[name], PA-C ? ?[facility] ?[address] ?[place], [place] [address] ?[phone] ? ?

## 2021-05-17 ENCOUNTER — Ambulatory Visit: Payer: Medicare Other | Admitting: Urology

## 2021-06-18 NOTE — Progress Notes (Unsigned)
   06/19/2021 10:04 AM  Jesse [name] [birth year] [id number]   Referring provider: Leanna Sato, MD [address] [place],  [place] [address]  Urological history: 1.  Erectile dysfunction -Contributing factors of age, CAD, HTN, MI, sleep apnea, diabetes, HLD, obesity, polysubstance abuse, anticoagulation therapy and smoking -Failed PDE 5 inhibitors and vacuum erection device  Chief Complaint  Patient presents with   Erectile Dysfunction    HPI: Jesse [name] is a 51 y.o. male who presents today for ICI titration and further management of his ED.     He is a patient of Dr. Heywood Footman and has failed PDE 5 inhibitors and vacuum erect devices.  Testosterone levels drawn per AUA guidelines for erectile dysfunction.  Patient is having  a rare spontaneous erections.  He denies any pain or curvature with erections.  He had a penile curvature several years ago, but it straightened itself out more time.  Physical Exam:  BP 137/82   Pulse 89   Ht 5\' 11"  (1.803 m)   Wt 250 lb (113.4 kg)   BMI 34.87 kg/m   Constitutional:  Well nourished. Alert and oriented, No acute distress. GU: No CVA tenderness.  No bladder fullness or masses.  Patient with uncircumcised phallus.  Foreskin easily retracted   Urethral meatus is patent.  No penile discharge. No penile lesions or rashes. Scrotum without lesions, cysts, rashes and/or edema.   Psychiatric: Normal mood and affect.   Procedure  Patient's left corpus cavernosum is identified.  An area near the base of the penis is cleansed with rubbing alcohol.  Careful to avoid the dorsal vein, 2 mcg of Trimix (papaverine 30 mg, phentolamine 1 mg and prostaglandin E1 10 mcg, Lot # [id number]  exp # 07/26/2021 is injected at a 90 degree angle into the left  corpus cavernosum near the base of the penis.  A Peyronie's plaque was present which caused difficulty with the injection of medication.  Patient experienced a semi firm erection in 15 minutes.     Patient's right corpus cavernosum is identified.  An area near the base of the penis is cleansed with rubbing alcohol.  Careful to avoid the dorsal vein, 1 mcg of Trimix (papaverine 30 mg, phentolamine 1 mg and prostaglandin E1 10 mcg, Lot # [id number]  exp # 07/26/2021 is injected at a 90 degree angle into the right corpus cavernosum near the base of the penis.  Patient experienced an erection in 15 minutes.     Assessment & Plan:    1. ED -Serum testosterone pending -achieved an erection after 3 mcg of Trimix -he will return next week for another possible titration if he finds his erection fades when he returns home this afternoon -Advised patient of the condition of priapism, painful erection lasting for more than four hours, and to contact the office immediately or seek treatment in the ED   2. Peyronie's disease -Plaque palpated at the base of the penis, but not causing the patient pain or significant curvature   Return in about 1 week (around 06/26/2021) for ICI titration .  [id number]  [facility] [address] [place], [facility] [address] [phone]

## 2021-06-19 ENCOUNTER — Encounter: Payer: Self-pay | Admitting: Urology

## 2021-06-19 ENCOUNTER — Ambulatory Visit (INDEPENDENT_AMBULATORY_CARE_PROVIDER_SITE_OTHER): Payer: Medicare Other | Admitting: Urology

## 2021-06-19 VITALS — BP 137/82 | HR 89 | Ht 71.0 in | Wt 250.0 lb

## 2021-06-19 DIAGNOSIS — N486 Induration penis plastica: Secondary | ICD-10-CM

## 2021-06-19 DIAGNOSIS — N5201 Erectile dysfunction due to arterial insufficiency: Secondary | ICD-10-CM | POA: Diagnosis not present

## 2021-06-19 NOTE — Patient Instructions (Signed)

## 2021-06-20 ENCOUNTER — Telehealth: Payer: Self-pay

## 2021-06-20 LAB — TESTOSTERONE: Testosterone: 486 ng/dL (ref 264–916)

## 2021-06-20 NOTE — Telephone Encounter (Signed)
Notified pt as advised, pt expressed understanding.  ?

## 2021-06-20 NOTE — Telephone Encounter (Signed)
-----   Message from [name], PA-C sent at 06/20/2021  8:11 AM EDT ----- Please let Jesse [name] know that his testosterone level is normal, so there is no indication for TRT.

## 2021-06-25 NOTE — Progress Notes (Deleted)
   06/25/2021 8:28 AM  [name] 03-10-1970 [id number]   Referring provider: [name], [place],  [place] [address]  Urological history: 1.  Erectile dysfunction -Contributing factors of age, CAD, HTN, MI, sleep apnea, diabetes, HLD, obesity, polysubstance abuse, anticoagulation therapy and smoking -Failed PDE 5 inhibitors and vacuum erection device  No chief complaint on file.   HPI: Jesse [name] is a 51 y.o. male who presents today for ICI titration and further management of his ED.     He is a patient of Dr. Dene Gentry and has failed PDE 5 inhibitors and vacuum erect devices.  Testosterone, 05/2021 - 486  Patient is having  a rare spontaneous erections.  He denies any pain or curvature with erections.  He had a penile curvature several years ago, but it straightened itself out over time.  Physical Exam:  There were no vitals taken for this visit.  Constitutional:  Well nourished. Alert and oriented, No acute distress. HEENT: [place] AT, moist mucus membranes.  Trachea midline Cardiovascular: No clubbing, cyanosis, or edema. Respiratory: Normal respiratory effort, no increased work of breathing. GU: No CVA tenderness.  No bladder fullness or masses.  Patient with circumcised/uncircumcised phallus. ***Foreskin easily retracted***  Urethral meatus is patent.  No penile discharge. No penile lesions or rashes. Scrotum without lesions, cysts, rashes and/or edema.   Neurologic: Grossly intact, no focal deficits, moving all 4 extremities. Psychiatric: Normal mood and affect.   Procedure  Patient's left corpus cavernosum is identified.  An area near the base of the penis is cleansed with rubbing alcohol.  Careful to avoid the dorsal vein, 2 mcg of Trimix (papaverine 30 mg, phentolamine 1 mg and prostaglandin E1 10 mcg, Lot # [id number]  exp # 07/26/2021 is injected at a 90 degree angle into the left  corpus cavernosum near the base of the penis.  A Peyronie's  plaque was present which caused difficulty with the injection of medication.  Patient experienced a semi firm erection in 15 minutes.    Patient's right corpus cavernosum is identified.  An area near the base of the penis is cleansed with rubbing alcohol.  Careful to avoid the dorsal vein, 1 mcg of Trimix (papaverine 30 mg, phentolamine 1 mg and prostaglandin E1 10 mcg, Lot # [id number]  exp # 07/26/2021 is injected at a 90 degree angle into the right corpus cavernosum near the base of the penis.  Patient experienced an erection in 15 minutes.     Assessment & Plan:    1. ED -Advised patient of the condition of priapism, painful erection lasting for more than four hours, and to contact the office immediately or seek treatment in the ED   2. Peyronie's disease -Plaque palpated at the base of the penis, but not causing the patient pain or significant curvature   No follow-ups on file.  [id number]  [facility] [address] [place], [place] [phone]

## 2021-06-26 ENCOUNTER — Ambulatory Visit: Payer: Medicare Other | Admitting: Urology

## 2021-06-26 DIAGNOSIS — N486 Induration penis plastica: Secondary | ICD-10-CM

## 2021-06-26 DIAGNOSIS — N5201 Erectile dysfunction due to arterial insufficiency: Secondary | ICD-10-CM

## 2021-07-05 ENCOUNTER — Ambulatory Visit: Payer: Medicare Other | Admitting: Urology

## 2021-07-05 NOTE — Progress Notes (Incomplete)
   07/05/2021 6:34 AM  Jesse [name] 30-Oct-1970 [id number]   Referring provider: Leanna Sato, MD [address] [place],  [place] [address]   No chief complaint on file.  Urological history :  1.  Erectile dysfunction -Contributing factors of age, CAD, HTN, MI, sleep apnea, diabetes, HLD, obesity, polysubstance abuse, anticoagulation therapy and smoking -Failed PDE 5 inhibitors and vacuum erection device  HPI: Jesse [name] is a 51 y.o. male who presents today for Trimix.       Physical Exam:  There were no vitals taken for this visit.  Constitutional:  Well nourished. Alert and oriented, No acute distress. GU: No CVA tenderness.  No bladder fullness or masses.  Patient with circumcised/uncircumcised phallus. ***Foreskin easily retracted  Urethral meatus is patent.  No penile discharge. No penile lesions or rashes. Scrotum without lesions, cysts, rashes and/or edema.  Testicles are located scrotally bilaterally. No masses are appreciated in the testicles. Left and right epididymis are normal. Psychiatric: Normal mood and affect.   Procedure  Patient's left corpus cavernosum is identified.  An area near the base of the penis is cleansed with rubbing alcohol.  Careful to avoid the dorsal vein, *** mcg of Trimix (papaverine 30 mg, phentolamine 1 mg and prostaglandin E1 10 mcg, Lot # ***@*** exp # *** is injected at a 90 degree angle into the left *** corpus cavernosum near the base of the penis.  Patient experienced a very firm erection in 15 minutes.     Assessment & Plan:    ED  Peyronie's Disease        No follow-ups on file.  [name]  [facility] [address] [place], [place] [address] [phone]

## 2021-07-10 ENCOUNTER — Encounter: Payer: Self-pay | Admitting: Internal Medicine

## 2021-07-11 ENCOUNTER — Ambulatory Visit: Admission: RE | Admit: 2021-07-11 | Payer: Medicare Other | Source: Home / Self Care | Admitting: Internal Medicine

## 2021-07-11 ENCOUNTER — Encounter: Admission: RE | Payer: Self-pay | Source: Home / Self Care

## 2021-07-11 SURGERY — COLONOSCOPY
Anesthesia: General

## 2021-07-13 ENCOUNTER — Encounter: Payer: Self-pay | Admitting: Urology

## 2021-08-22 ENCOUNTER — Ambulatory Visit: Payer: Medicare Other | Admitting: Urology

## 2021-09-12 ENCOUNTER — Telehealth: Payer: Self-pay | Admitting: Urology

## 2021-09-12 ENCOUNTER — Other Ambulatory Visit: Payer: Self-pay | Admitting: Urology

## 2021-09-12 DIAGNOSIS — N5201 Erectile dysfunction due to arterial insufficiency: Secondary | ICD-10-CM

## 2021-09-12 MED ORDER — AMBULATORY NON FORMULARY MEDICATION: 0 refills | Status: DC

## 2021-09-12 NOTE — Telephone Encounter (Signed)
Patient will need the Trimix called in again. The medication that he has expired in July. His appt was canceled for 8.24.23 I told him to call the office back to reschd once he picked up his medication.   Marcelino Duster

## 2021-09-13 ENCOUNTER — Ambulatory Visit: Payer: Medicare Other | Admitting: Urology

## 2021-10-07 ENCOUNTER — Other Ambulatory Visit: Payer: Self-pay

## 2021-10-07 ENCOUNTER — Emergency Department
Admission: EM | Admit: 2021-10-07 | Discharge: 2021-10-08 | Disposition: A | Discharge status: Home / Self Care | Payer: Medicare Other | Attending: Emergency Medicine | Admitting: Emergency Medicine

## 2021-10-07 ENCOUNTER — Encounter: Payer: Self-pay | Admitting: Emergency Medicine

## 2021-10-07 ENCOUNTER — Emergency Department: Payer: Medicare Other

## 2021-10-07 DIAGNOSIS — Y900 Blood alcohol level of less than 20 mg/100 ml: Secondary | ICD-10-CM | POA: Diagnosis not present

## 2021-10-07 DIAGNOSIS — G43119 Migraine with aura, intractable, without status migrainosus: Secondary | ICD-10-CM | POA: Insufficient documentation

## 2021-10-07 DIAGNOSIS — R519 Headache, unspecified: Secondary | ICD-10-CM | POA: Diagnosis present

## 2021-10-07 LAB — COMPREHENSIVE METABOLIC PANEL
ALT: 17 U/L (ref 0–44)
AST: 20 U/L (ref 15–41)
Albumin: 3.9 g/dL (ref 3.5–5.0)
Alkaline Phosphatase: 72 U/L (ref 38–126)
Anion gap: 9 (ref 5–15)
BUN: 8 mg/dL (ref 6–20)
CO2: 23 mmol/L (ref 22–32)
Calcium: 8.5 mg/dL — ABNORMAL LOW (ref 8.9–10.3)
Chloride: 102 mmol/L (ref 98–111)
Creatinine, Ser: 0.74 mg/dL (ref 0.61–1.24)
GFR, Estimated: >60 mL/min (ref >60)
Glucose, Bld: 193 mg/dL — ABNORMAL HIGH (ref 70–99)
Potassium: 3.3 mmol/L — ABNORMAL LOW (ref 3.5–5.1)
Sodium: 134 mmol/L — ABNORMAL LOW (ref 135–145)
Total Bilirubin: 0.5 mg/dL (ref 0.3–1.2)
Total Protein: 7.1 g/dL (ref 6.5–8.1)

## 2021-10-07 LAB — CBC
HCT: 51.4 % (ref 39.0–52.0)
Hemoglobin: 16.7 g/dL (ref 13.0–17.0)
MCH: 27.2 pg (ref 26.0–34.0)
MCHC: 32.5 g/dL (ref 30.0–36.0)
MCV: 83.6 fL (ref 80.0–100.0)
Platelets: 306 10*3/uL (ref 150–400)
RBC: 6.15 MIL/uL — ABNORMAL HIGH (ref 4.22–5.81)
RDW: 14.4 % (ref 11.5–15.5)
WBC: 13.7 10*3/uL — ABNORMAL HIGH (ref 4.0–10.5)
nRBC: 0 % (ref 0.0–0.2)

## 2021-10-07 LAB — DIFFERENTIAL
Abs Immature Granulocytes: 0.07 10*3/uL (ref 0.00–0.07)
Basophils Absolute: 0.1 10*3/uL (ref 0.0–0.1)
Basophils Relative: 1 %
Eosinophils Absolute: 0.7 10*3/uL — ABNORMAL HIGH (ref 0.0–0.5)
Eosinophils Relative: 5 %
Immature Granulocytes: 1 %
Lymphocytes Relative: 22 %
Lymphs Abs: 3 10*3/uL (ref 0.7–4.0)
Monocytes Absolute: 0.9 10*3/uL (ref 0.1–1.0)
Monocytes Relative: 7 %
Neutro Abs: 8.9 10*3/uL — ABNORMAL HIGH (ref 1.7–7.7)
Neutrophils Relative %: 64 %

## 2021-10-07 LAB — APTT: aPTT: 31 seconds (ref 24–36)

## 2021-10-07 LAB — ETHANOL: Alcohol, Ethyl (B): <10 mg/dL (ref <10)

## 2021-10-07 LAB — PROTIME-INR
INR: 1.1 (ref 0.8–1.2)
Prothrombin Time: 14 seconds (ref 11.4–15.2)

## 2021-10-07 MED ORDER — ACETAMINOPHEN 500 MG PO TABS
1000.0000 mg | ORAL_TABLET | Freq: Once | ORAL | Status: AC
  Administered 2021-10-07: 1000 mg via ORAL
  Filled 2021-10-07: qty 2

## 2021-10-07 MED ORDER — SODIUM CHLORIDE 0.9 % IV BOLUS
500.0000 mL | Freq: Once | INTRAVENOUS | Status: AC
  Administered 2021-10-07: 500 mL via INTRAVENOUS

## 2021-10-07 MED ORDER — POTASSIUM CHLORIDE CRYS ER 20 MEQ PO TBCR
40.0000 meq | EXTENDED_RELEASE_TABLET | Freq: Once | ORAL | Status: AC
  Administered 2021-10-07: 40 meq via ORAL
  Filled 2021-10-07: qty 2

## 2021-10-07 NOTE — Discharge Instructions (Addendum)
The sound like you are having a migraine with an aura beforehand.  You need to call your eye doctor and also follow-up with a neurologist.  Please call the 1 listed and state that you need an ER follow-up.  Return to the ER if you develop worsening symptoms or any other concerns.  Your CT scan was normal today but I cannot predict the future so if things are changing then you need to be reevaluated.

## 2021-10-07 NOTE — ED Provider Notes (Addendum)
[facility] Provider Note    Event Date/Time   First MD Initiated Contact with Patient 10/07/21 2222     (approximate)   History   Headache   HPI  Jesse [name] is a 51 y.o. male who comes in with concerns for left-sided headache.  Patient reports that around 8:00 he noticed some flashes of light in his right eye that he developed some flasher.  He denies any other symptoms associated with it he did feel a little bit of dizziness but he reports having a really bad diarrheal episodes over the past 2 weeks which is now gotten better and no longer has any diarrhea.  He reports having some episodes of these flashing lights and mild headaches in the past but usually they get better with getting in a dark room and giving himself some time.  However this time the headache was worse than normal.  He denies it being sudden and severe in onset it was actually more of a gradually increasing headache.  He denies any neck pain.  He did take ibuprofen he reports that his symptoms have now since all relieved.  He denies any chest pain, shortness of breath or other concerns.     Physical Exam   Triage Vital Signs: ED Triage Vitals  Enc Vitals Group     BP 10/07/21 2149 (!) 163/109     Pulse Rate 10/07/21 2149 90     Resp 10/07/21 2149 16     Temp 10/07/21 2149 97.8 F (36.6 C)     Temp Source 10/07/21 2149 Oral     SpO2 10/07/21 2149 98 %     Weight 10/07/21 2150 250 lb (113.4 kg)     Height 10/07/21 2150 5\' 11"  (1.803 m)     Head Circumference --      Peak Flow --      Pain Score 10/07/21 2150 7     Pain Loc --      Pain Edu? --      Excl. in [place]? --     Most recent vital signs: Vitals:   10/07/21 2149 10/07/21 2214  BP: (!) 163/109 (!) 144/95  Pulse: 90 81  Resp: 16 16  Temp: 97.8 F (36.6 C)   SpO2: 98% 98%     General: Awake, no distress.  CV:  Good peripheral perfusion.  Resp:  Normal effort.  Abd:  No distention.  Other:  Cranial nerves II  through XII are intact.  Equal strength in arms and legs.  Vision intact.  Extraocular movements intact.  Peripheral vision intact.  Patient is amatory around the room without any issues.  Negative Romberg   ED Results / Procedures / Treatments   Labs (all labs ordered are listed, but only abnormal results are displayed) Labs Reviewed  CBC - Abnormal; Notable for the following components:      Result Value   WBC 13.7 (*)    RBC 6.15 (*)    All other components within normal limits  DIFFERENTIAL - Abnormal; Notable for the following components:   Neutro Abs 8.9 (*)    Eosinophils Absolute 0.7 (*)    All other components within normal limits  COMPREHENSIVE METABOLIC PANEL - Abnormal; Notable for the following components:   Sodium 134 (*)    Potassium 3.3 (*)    Glucose, Bld 193 (*)    Calcium 8.5 (*)    All other components within normal limits  PROTIME-INR  APTT  ETHANOL  I-STAT CREATININE, ED  CBG MONITORING, ED     EKG  My interpretation of EKG:  Normal sinus rate of 83 with T wave versions in lead III aVF and occasional PVC  I reviewed his prior EKG and he has had similar T wave inversions in lead III and aVF as a 2022  RADIOLOGY I have reviewed the CT head personally interpreted no evidence of intracranial hemorrhage  PROCEDURES:  Critical Care performed: No  .1-3 Lead EKG Interpretation  Performed by: Vanessa [place], MD Authorized by: Vanessa [place], MD     Interpretation: abnormal     ECG rate:  80   ECG rate assessment: normal     Rhythm: sinus rhythm     Ectopy: PVCs     Conduction: normal      MEDICATIONS ORDERED IN ED: Medications  sodium chloride 0.9 % bolus 500 mL (has no administration in time range)  potassium chloride SA (KLOR-CON M) CR tablet 40 mEq (has no administration in time range)     IMPRESSION / MDM / ASSESSMENT AND PLAN / ED COURSE  I reviewed the triage vital signs and the nursing notes.   Patient's presentation is most  consistent with acute presentation with potential threat to life or bodily function.   Patient comes in with headache that was preceded by some flashing lights in his right eye.  Visual changes and headache have since resolved with ibuprofen.  This sounds most likely like a migraine.  CT head ordered from triage and labs ordered to evaluate for Electra abnormalities, AKI.  Patient's stroke scale is 0.  EtOH is negative.  INR normal.  CBC shows slight elevated white count but does recently having diarrheal illness and patient has no fever.  His sodium and potassium are little bit low we will give a little bit of IV fluids and potassium.  I considered the possibility of subarachnoid but given the symptoms were within 6 hours of patient coming in this is high-sensitivity for rule out of subarachnoid and the fact that his symptoms have since all resolved now and he denies any symptoms.  We did discuss CTA but he agrees with holding off at this time given resolution of symptoms.  Patient does report a little bit of headache again so we will give some Tylenol with the fluids and potassium but he feels comfortable with discharge home after this.  We will discharge patient after medications as long as he continues to be doing well.  He expressed understanding and felt comfortable with this plan.  I have given him neurology as well and recommended him following up with an eye doctor as well and he expressed understanding  The patient is on the cardiac monitor to evaluate for evidence of arrhythmia and/or significant heart rate changes.      FINAL CLINICAL IMPRESSION(S) / ED DIAGNOSES   Final diagnoses:  Intractable migraine with aura without status migrainosus     Rx / DC Orders   ED Discharge Orders     None        Note:  This document was prepared using Dragon voice recognition software and may include unintentional dictation errors.   Vanessa [place], MD 10/07/21 2256    Vanessa [place], MD 10/07/21 [phone]

## 2021-10-07 NOTE — ED Triage Notes (Signed)
Pt via POV from home c/o left-sided headache, flashing lights in both eyes, dizziness/vertigo x several hours this evening. Ambulatory w/o difficulty and no weakness, facial droop, speech changes, or other neuro symptoms noted or reported. Pt took 800mg  ibuprofen an hour ago (8:30pm) with no relief so far. Pt has multiple stents after heart attacks, type 2 diabetes, HTN, compliant with all meds.

## 2021-10-08 NOTE — ED Notes (Signed)
Pt verbalizes understanding discharge instructions.  Pt ambulated to private vehicle.  

## 2021-10-26 NOTE — Progress Notes (Signed)
PT/PTT/ethanol order as part of workup for possible hemorrhagic stroke- CT head was negative however for bleed.

## 2022-01-13 ENCOUNTER — Other Ambulatory Visit: Payer: Self-pay

## 2022-01-13 ENCOUNTER — Emergency Department: Payer: Medicare Other

## 2022-01-13 DIAGNOSIS — Z1152 Encounter for screening for COVID-19: Secondary | ICD-10-CM | POA: Insufficient documentation

## 2022-01-13 DIAGNOSIS — F17210 Nicotine dependence, cigarettes, uncomplicated: Secondary | ICD-10-CM | POA: Insufficient documentation

## 2022-01-13 DIAGNOSIS — R000 Tachycardia, unspecified: Secondary | ICD-10-CM | POA: Insufficient documentation

## 2022-01-13 DIAGNOSIS — E119 Type 2 diabetes mellitus without complications: Secondary | ICD-10-CM | POA: Diagnosis not present

## 2022-01-13 DIAGNOSIS — R0602 Shortness of breath: Secondary | ICD-10-CM | POA: Diagnosis present

## 2022-01-13 DIAGNOSIS — E871 Hypo-osmolality and hyponatremia: Secondary | ICD-10-CM | POA: Diagnosis not present

## 2022-01-13 DIAGNOSIS — J101 Influenza due to other identified influenza virus with other respiratory manifestations: Secondary | ICD-10-CM | POA: Insufficient documentation

## 2022-01-13 DIAGNOSIS — J189 Pneumonia, unspecified organism: Secondary | ICD-10-CM | POA: Insufficient documentation

## 2022-01-13 DIAGNOSIS — R778 Other specified abnormalities of plasma proteins: Secondary | ICD-10-CM | POA: Insufficient documentation

## 2022-01-13 DIAGNOSIS — I2510 Atherosclerotic heart disease of native coronary artery without angina pectoris: Secondary | ICD-10-CM | POA: Diagnosis not present

## 2022-01-13 LAB — TROPONIN I (HIGH SENSITIVITY)
Troponin I (High Sensitivity): 40 ng/L — ABNORMAL HIGH (ref <18)
Troponin I (High Sensitivity): 47 ng/L — ABNORMAL HIGH (ref <18)

## 2022-01-13 LAB — BASIC METABOLIC PANEL
Anion gap: 11 (ref 5–15)
BUN: 10 mg/dL (ref 6–20)
CO2: 21 mmol/L — ABNORMAL LOW (ref 22–32)
Calcium: 8.7 mg/dL — ABNORMAL LOW (ref 8.9–10.3)
Chloride: 101 mmol/L (ref 98–111)
Creatinine, Ser: 0.66 mg/dL (ref 0.61–1.24)
GFR, Estimated: >60 mL/min (ref >60)
Glucose, Bld: 110 mg/dL — ABNORMAL HIGH (ref 70–99)
Potassium: 4 mmol/L (ref 3.5–5.1)
Sodium: 133 mmol/L — ABNORMAL LOW (ref 135–145)

## 2022-01-13 LAB — CBC
HCT: 47.6 % (ref 39.0–52.0)
Hemoglobin: 15.4 g/dL (ref 13.0–17.0)
MCH: 25.9 pg — ABNORMAL LOW (ref 26.0–34.0)
MCHC: 32.4 g/dL (ref 30.0–36.0)
MCV: 80.1 fL (ref 80.0–100.0)
Platelets: 403 10*3/uL — ABNORMAL HIGH (ref 150–400)
RBC: 5.94 MIL/uL — ABNORMAL HIGH (ref 4.22–5.81)
RDW: 14.3 % (ref 11.5–15.5)
WBC: 10.3 10*3/uL (ref 4.0–10.5)
nRBC: 0 % (ref 0.0–0.2)

## 2022-01-13 NOTE — ED Triage Notes (Signed)
Pt to ED POV for ongoing shortness of breath since 1 wk or longer. Worse at night and worse with lying down. States wife and grand kids have been sick. Has nighttime cough.  Home covid test was negative. States has no energy.   Sees cardiologist, hx MI X3 with stents. Denies hx CHF.

## 2022-01-13 NOTE — ED Notes (Signed)
Extra lavender tube sent in case BNP ordered.

## 2022-01-14 ENCOUNTER — Emergency Department
Admission: EM | Admit: 2022-01-14 | Discharge: 2022-01-14 | Disposition: A | Discharge status: Home / Self Care | Payer: Medicare Other | Attending: Emergency Medicine | Admitting: Emergency Medicine

## 2022-01-14 DIAGNOSIS — J101 Influenza due to other identified influenza virus with other respiratory manifestations: Secondary | ICD-10-CM

## 2022-01-14 DIAGNOSIS — R7989 Other specified abnormal findings of blood chemistry: Secondary | ICD-10-CM

## 2022-01-14 DIAGNOSIS — R0609 Other forms of dyspnea: Secondary | ICD-10-CM

## 2022-01-14 DIAGNOSIS — J189 Pneumonia, unspecified organism: Secondary | ICD-10-CM

## 2022-01-14 LAB — RESP PANEL BY RT-PCR (RSV, FLU A&B, COVID)  RVPGX2
Influenza A by PCR: POSITIVE — AB
Influenza B by PCR: NEGATIVE
Resp Syncytial Virus by PCR: NEGATIVE
SARS Coronavirus 2 by RT PCR: NEGATIVE

## 2022-01-14 MED ORDER — ALBUTEROL SULFATE HFA 108 (90 BASE) MCG/ACT IN AERS: INHALATION_SPRAY | RESPIRATORY_TRACT | 0 refills | Status: DC

## 2022-01-14 MED ORDER — HYDROCOD POLI-CHLORPHE POLI ER 10-8 MG/5ML PO SUER: 5.0000 mL | Freq: Two times a day (BID) | ORAL | 0 refills | Status: DC | PRN

## 2022-01-14 MED ORDER — AZITHROMYCIN 250 MG PO TABS: ORAL_TABLET | ORAL | 0 refills | Status: DC

## 2022-01-14 NOTE — ED Provider Notes (Signed)
[facility] Provider Note    Event Date/Time   First MD Initiated Contact with Patient 01/14/22 0011     (approximate)   History   Shortness of Breath   HPI  Jesse [name] is a 51 y.o. male who reports ongoing cigarette use, coronary artery disease, prior NSTEMI with stenting, polysubstance abuse, diabetes, hyperlipidemia.  He presents for evaluation of 1-2 weeks of shortness of breath, cough, malaise, congestion, fatigue, and over the last few days, generalized body aches.  He said that he continues to smoke up until at least 2 days ago.  Exertion makes his symptoms worse.  At rest he is not in any respiratory distress, but sometimes he will get started coughing and it is hard to catch his breath.  He denies having any chest pain at any point.  He said that he got sick 1 to 2 weeks ago, then seem to get a little bit better and then has gotten worse again over the last couple of days.  He notes that his daughter-in-law was recently tested and tested positive for influenza A.     Physical Exam   Triage Vital Signs: ED Triage Vitals  Enc Vitals Group     BP 01/13/22 1627 (!) 140/98     Pulse Rate 01/13/22 1627 (!) 106     Resp 01/13/22 1627 20     Temp 01/13/22 1627 98.5 F (36.9 C)     Temp Source 01/13/22 1627 Oral     SpO2 01/13/22 1627 98 %     Weight 01/13/22 1628 113.9 kg (251 lb)     Height 01/13/22 1628 1.803 m (5\' 11" )     Head Circumference --      Peak Flow --      Pain Score 01/13/22 1628 6     Pain Loc --      Pain Edu? --      Excl. in [place]? --     Most recent vital signs: Vitals:   01/13/22 1920 01/13/22 2343  BP: (!) 138/90 126/84  Pulse: (!) 105 (!) 108  Resp: 18 20  Temp: 98.2 F (36.8 C) 98.6 F (37 C)  SpO2: 97% 95%     General: Awake, no distress.  Generally well-appearing. CV:  Good peripheral perfusion.  Mild tachycardia, regular rhythm, normal heart sounds. Resp:  Normal effort.  Lungs are clear to auscultation,  no wheezing rales or rhonchi.  No accessory muscle usage or retractions.  Speaking in a normal voice and full sentences.  Occasional cough. Abd:  No distention.  Other:  AAOx3, conversant, pleasant, normal mood and affect.   ED Results / Procedures / Treatments   Labs (all labs ordered are listed, but only abnormal results are displayed) Labs Reviewed  RESP PANEL BY RT-PCR (RSV, FLU A&B, COVID)  RVPGX2 - Abnormal; Notable for the following components:      Result Value   Influenza A by PCR POSITIVE (*)    All other components within normal limits  BASIC METABOLIC PANEL - Abnormal; Notable for the following components:   Sodium 133 (*)    CO2 21 (*)    Glucose, Bld 110 (*)    Calcium 8.7 (*)    All other components within normal limits  CBC - Abnormal; Notable for the following components:   RBC 5.94 (*)    MCH 25.9 (*)    Platelets 403 (*)    All other components within normal limits  TROPONIN  I (HIGH SENSITIVITY) - Abnormal; Notable for the following components:   Troponin I (High Sensitivity) 40 (*)    All other components within normal limits  TROPONIN I (HIGH SENSITIVITY) - Abnormal; Notable for the following components:   Troponin I (High Sensitivity) 47 (*)    All other components within normal limits     EKG  ED ECG REPORT I, Loleta Rose, the attending physician, personally viewed and interpreted this ECG.  Date: 01/13/2022 EKG Time: 16: 33 Rate: 106 Rhythm: Sinus tachycardia QRS Axis: normal Intervals: normal ST/T Wave abnormalities: Non-specific ST segment / T-wave changes, but no clear evidence of acute ischemia. Narrative Interpretation: no definitive evidence of acute ischemia; does not meet STEMI criteria.    RADIOLOGY I viewed and interpreted the patient's two-view chest x-ray.  He has some perihilar inflammation.  Radiology report indicates that this appears to be inflammatory versus atypical infection.  No lobar  pneumonia.    PROCEDURES:  Critical Care performed: No  .1-3 Lead EKG Interpretation  Performed by: Loleta Rose, MD Authorized by: Loleta Rose, MD     Interpretation: abnormal     ECG rate:  105   ECG rate assessment: tachycardic     Rhythm: sinus tachycardia     Ectopy: none     Conduction: normal      MEDICATIONS ORDERED IN ED: Medications - No data to display   IMPRESSION / MDM / ASSESSMENT AND PLAN / ED COURSE  I reviewed the triage vital signs and the nursing notes.                              Differential diagnosis includes, but is not limited to, viral infection, community-acquired/atypical pneumonia, NSTEMI, PE.  Patient's presentation is most consistent with acute presentation with potential threat to life or bodily function.  Labs/studies ordered: EKG, cardiac monitoring, two-view chest x-ray, BMP, high-sensitivity troponin x 2, CBC, respiratory viral panel.  The patient is on the cardiac monitor to evaluate for evidence of arrhythmia and/or significant heart rate changes.  Chest x-ray as documented above is suggestive of perihilar inflammation versus atypical infection.  His respiratory viral panel is positive for influenza A which is consistent with his presentation.  Mild hyponatremia but nothing clinically significant on his labs other than a mildly elevated high-sensitivity troponin x 2.  His EKG shows no significant signs of ischemia and of note, he is having no chest pain at any point.  I strongly suspect that the mild elevation of the high-sensitivity troponin is due to demand ischemia from his acute respiratory viral infection and possible atypical bacterial pneumonia.  There is no indication he would benefit from hospitalization at this point although I did consider it given his prior cardiac history.  I talked about all of his results with him and he is very comfortable with the plan for discharge and outpatient follow-up.  Given that he has had  respiratory symptoms for more than a week, I do not think he would get even the minimal benefit offered by Tamiflu.  However given the possibility of an atypical infection, I wrote prescriptions for azithromycin in addition to an albuterol inhaler and cough medicine to help with the symptom control.  I had my usual influenza discussion with him and gave strict return precautions.  I also put in an ambulatory referral to cardiology given the elevated troponin and his ACS/CAD history and also recommended he follow-up with his PCP.  He agrees with the plan.      FINAL CLINICAL IMPRESSION(S) / ED DIAGNOSES   Final diagnoses:  Influenza A  Atypical pneumonia  Elevated troponin level  Dyspnea on exertion     Rx / DC Orders   ED Discharge Orders          Ordered    chlorpheniramine-HYDROcodone (TUSSIONEX) 10-8 MG/5ML  Every 12 hours PRN        01/14/22 0111    azithromycin (ZITHROMAX) 250 MG tablet        01/14/22 0111    albuterol (VENTOLIN HFA) 108 (90 Base) MCG/ACT inhaler       Note to Pharmacy: Pharmacy may substitute brand and size for insurance-approved equivalent   01/14/22 0111    Ambulatory referral to Cardiology       Comments: If you have not heard from the Cardiology office within the next 72 hours please call [phone].   01/14/22 0114             Note:  This document was prepared using Dragon voice recognition software and may include unintentional dictation errors.   Loleta Rose, MD 01/14/22 [phone]

## 2022-01-14 NOTE — Discharge Instructions (Addendum)
As we discussed, you have the flu, but it is also possible you have developed a bacterial pneumonia on top of it.  We have prescribed several medications for you, including an antibiotic, and albuterol inhaler which can help with cough and with your breathing in general, and some cough medicine which may help you with your symptoms and may help you sleep (it will likely make you sleepy, so do not take it before going to work or before driving).  Given that you have had heart problems in the past, we also placed a consult for you to follow-up with cardiology clinic.  Please consider following up with them at the next available opportunity.  They should reach out to you to help you schedule an appointment.  Some of your lab work, specifically your troponin level, was slightly elevated.  This is most likely due to the stress on your body from being ill for more than a week.  We recommend that you follow-up with your primary care doctor and schedule the next available follow-up appointment.  You should be feeling better from the flu by that point and hopefully your labs will have returned to normal.  If you feel like you are getting worse or develop new symptoms that concern you, please return to the emergency department.

## 2022-01-14 NOTE — ED Notes (Signed)
E signature pad not working. Pt educated on discharge instructions and verbalized understanding.  

## 2022-03-19 ENCOUNTER — Emergency Department: Payer: 59

## 2022-03-19 ENCOUNTER — Encounter: Payer: Self-pay | Admitting: Emergency Medicine

## 2022-03-19 ENCOUNTER — Emergency Department
Admission: EM | Admit: 2022-03-19 | Discharge: 2022-03-20 | Disposition: A | Discharge status: Home / Self Care | Payer: 59 | Attending: Emergency Medicine | Admitting: Emergency Medicine

## 2022-03-19 DIAGNOSIS — Z20822 Contact with and (suspected) exposure to covid-19: Secondary | ICD-10-CM | POA: Diagnosis not present

## 2022-03-19 DIAGNOSIS — E119 Type 2 diabetes mellitus without complications: Secondary | ICD-10-CM | POA: Insufficient documentation

## 2022-03-19 DIAGNOSIS — I110 Hypertensive heart disease with heart failure: Secondary | ICD-10-CM | POA: Diagnosis not present

## 2022-03-19 DIAGNOSIS — I2510 Atherosclerotic heart disease of native coronary artery without angina pectoris: Secondary | ICD-10-CM | POA: Insufficient documentation

## 2022-03-19 DIAGNOSIS — I509 Heart failure, unspecified: Secondary | ICD-10-CM | POA: Insufficient documentation

## 2022-03-19 DIAGNOSIS — R0602 Shortness of breath: Secondary | ICD-10-CM | POA: Diagnosis present

## 2022-03-19 NOTE — ED Triage Notes (Signed)
Pt presents via POV with complaints of SOB for the last several months that has gotten progressively worse. Pt had the flu 2 months ago and since that time he has become more dyspneic. He states its the same when active and sedentary. Endorses a 15lb weight gain. Denies CP, fevers, chills, N/V/D.

## 2022-03-19 NOTE — ED Provider Notes (Signed)
[facility] Provider Note    Event Date/Time   First MD Initiated Contact with Patient 03/19/22 2318     (approximate)   History   Shortness of Breath   HPI  Jesse [name] is a 52 y.o. male who presents to the ED for evaluation of Shortness of Breath    I reviewed cardiology clinic visit from October.  Obese patient with history of CAD s/p stenting.  DAPT with Plavix.  DM, HTN.   Patient presents to the ED for evaluation of worsening dyspnea.  He reports weeks of progressive unintentional weight gain, orthopnea, dyspnea on exertion and positional chest discomfort.  Reports chest pain when supine alongside worsening breathing.  No fevers or increased cough or sputum production.  Physical Exam   Triage Vital Signs: ED Triage Vitals  Enc Vitals Group     BP 03/19/22 2316 (!) 154/97     Pulse Rate 03/19/22 2316 (!) 106     Resp 03/19/22 2316 16     Temp 03/19/22 2316 97.8 F (36.6 C)     Temp Source 03/19/22 2316 Oral     SpO2 03/19/22 2316 100 %     Weight 03/19/22 2314 265 lb (120.2 kg)     Height 03/19/22 2314 '5\' 11"'$  (1.803 m)     Head Circumference --      Peak Flow --      Pain Score --      Pain Loc --      Pain Edu? --      Excl. in [place]? --     Most recent vital signs: Vitals:   03/20/22 0030 03/20/22 0130  BP:  (!) 136/96  Pulse: (!) 52 95  Resp: (!) 26 (!) 22  Temp:    SpO2:  97%    General: Awake, no distress.  Looks well CV:  Good peripheral perfusion.  Resp:  Normal effort.  No wheezing. Abd:  No distention.  MSK:  No deformity noted.  Pitting edema to bilateral lower extremities symmetrically. Neuro:  No focal deficits appreciated. Other:     ED Results / Procedures / Treatments   Labs (all labs ordered are listed, but only abnormal results are displayed) Labs Reviewed  COMPREHENSIVE METABOLIC PANEL - Abnormal; Notable for the following components:      Result Value   Glucose, Bld 248 (*)    Calcium 8.8 (*)     Albumin 3.3 (*)    All other components within normal limits  CBC WITH DIFFERENTIAL/PLATELET - Abnormal; Notable for the following components:   WBC 11.6 (*)    MCH 25.7 (*)    RDW 16.2 (*)    All other components within normal limits  BRAIN NATRIURETIC PEPTIDE - Abnormal; Notable for the following components:   B Natriuretic Peptide 573.2 (*)    All other components within normal limits  D-DIMER, QUANTITATIVE - Abnormal; Notable for the following components:   D-Dimer, Quant 0.72 (*)    All other components within normal limits  TROPONIN I (HIGH SENSITIVITY) - Abnormal; Notable for the following components:   Troponin I (High Sensitivity) 41 (*)    All other components within normal limits  TROPONIN I (HIGH SENSITIVITY) - Abnormal; Notable for the following components:   Troponin I (High Sensitivity) 43 (*)    All other components within normal limits  RESP PANEL BY RT-PCR (RSV, FLU A&B, COVID)  RVPGX2    EKG Sinus rhythm with a rate of 101  bpm.  Normal axis.  Incomplete bundle.  Couple PVCs.  No STEMI.  Nonspecific ST changes.  RADIOLOGY 1 view CXR interpreted by me with pulmonary vascular congestion  Official radiology report(s): CT Angio Chest PE W and/or Wo Contrast  Result Date: 03/20/2022 CLINICAL DATA:  Eval for PE. Suspect new CHF. Positive D-dimer. Chest pain or shortness of breath. EXAM: CT ANGIOGRAPHY CHEST WITH CONTRAST TECHNIQUE: Multidetector CT imaging of the chest was performed using the standard protocol during bolus administration of intravenous contrast. Multiplanar CT image reconstructions and MIPs were obtained to evaluate the vascular anatomy. RADIATION DOSE REDUCTION: This exam was performed according to the departmental dose-optimization program which includes automated exposure control, adjustment of the mA and/or kV according to patient size and/or use of iterative reconstruction technique. CONTRAST:  133m OMNIPAQUE IOHEXOL 350 MG/ML SOLN COMPARISON:   Chest radiographs 03/19/2022 FINDINGS: Cardiovascular: Satisfactory opacification of the pulmonary arteries to the segmental level. No evidence of pulmonary embolism. Cardiomegaly. Trace pericardial effusion. Coronary artery and aortic atherosclerotic calcification. Mediastinum/Nodes: Mediastinal lymphadenopathy measuring up to 1.5 cm (AP window node; 4/49), favored reactive. Unremarkable thyroid and esophagus. Lungs/Pleura: Diffuse bronchial wall thickening. Diffuse interlobular septal thickening. Patchy ground-glass opacities. Findings are favored to represent pulmonary edema. No focal pneumonia, pleural effusion, or pneumothorax. Upper Abdomen: Small volume perihepatic ascites possibly related to CHF. Cholecystectomy. Musculoskeletal: No acute osseous abnormality. Review of the MIP images confirms the above findings. IMPRESSION: 1. No evidence of pulmonary embolism. 2. Cardiomegaly with pulmonary edema. 3. Coronary artery atherosclerotic calcification. Aortic Atherosclerosis (ICD10-I70.0). Electronically Signed   By: TPlacido SouM.D.   On: 03/20/2022 01:13   DG Chest Portable 1 View  Result Date: 03/19/2022 CLINICAL DATA:  Dyspnea, weight gain, eval infiltrate and pulmonary congestion EXAM: PORTABLE CHEST 1 VIEW COMPARISON:  01/13/2022 FINDINGS: Cardiomegaly and pulmonary vascular congestion. Diffuse interstitial opacities compatible with edema. No focal consolidation, pleural effusion, or pneumothorax. IMPRESSION: Findings suggestive of CHF. Electronically Signed   By: TPlacido SouM.D.   On: 03/19/2022 23:42    PROCEDURES and INTERVENTIONS:  .1-3 Lead EKG Interpretation  Performed by: SVladimir Crofts MD Authorized by: SVladimir Crofts MD     Interpretation: abnormal     ECG rate:  102   ECG rate assessment: tachycardic     Rhythm: sinus tachycardia     Ectopy: none     Conduction: normal     Medications  iohexol (OMNIPAQUE) 350 MG/ML injection 100 mL (100 mLs Intravenous Contrast Given  03/20/22 0056)  furosemide (LASIX) injection 40 mg (40 mg Intravenous Given 03/20/22 0214)     IMPRESSION / MDM / ASSESSMENT AND PLAN / ED COURSE  I reviewed the triage vital signs and the nursing notes.  Differential diagnosis includes, but is not limited to, ACS, PTX, PNA, muscle strain/spasm, PE, dissection, COPD exacerbation, CHF  {Patient presents with symptoms of an acute illness or injury that is potentially life-threatening.  52year old presents with progressively worsening dyspnea and orthopnea, with evidence of new onset CHF suitable for outpatient management.  Noted to be tachycardic on arrival, but looks well without hypoxia.  Does have signs of volume overload on exam and congested CXR.  His BNP is elevated.  D-dimer is elevated and CTA chest obtained, no evidence of PE or pneumonia.  His troponins are moderately elevated, but flat on repeat.  His chest pain is atypical I suspect more symptomatic from his volume overload.  I considered observation admission for this patient, but he is emphatic about going home,  which I think is reasonable to try.  We will start him on diuretics, refer to CHF clinic and have him follow-up with his cardiologist.  We discussed return precautions.  Clinical Course as of 03/20/22 0221  Wed Mar 20, 2022  0213 Reassessed.  Feeling well.  We discussed workup, CHF.  He does not want to be admitted, and considering how well he looks and stable workup I think is reasonable to try outpatient management with CHF clinic follow-up with the initiation of diuresis to his regimen.  We discussed close return precautions. [DS]    Clinical Course User Index [DS] Vladimir Crofts, MD     FINAL CLINICAL IMPRESSION(S) / ED DIAGNOSES   Final diagnoses:  New onset of congestive heart failure ([place])     Rx / DC Orders   ED Discharge Orders          Ordered    furosemide (LASIX) 40 MG tablet  Daily        03/20/22 0214    AMB referral to CHF clinic        03/20/22  0214             Note:  This document was prepared using Dragon voice recognition software and may include unintentional dictation errors.   Vladimir Crofts, MD 03/20/22 [place]

## 2022-03-20 ENCOUNTER — Emergency Department: Payer: 59

## 2022-03-20 DIAGNOSIS — I509 Heart failure, unspecified: Secondary | ICD-10-CM | POA: Diagnosis not present

## 2022-03-20 LAB — RESP PANEL BY RT-PCR (RSV, FLU A&B, COVID)  RVPGX2
Influenza A by PCR: NEGATIVE
Influenza B by PCR: NEGATIVE
Resp Syncytial Virus by PCR: NEGATIVE
SARS Coronavirus 2 by RT PCR: NEGATIVE

## 2022-03-20 LAB — COMPREHENSIVE METABOLIC PANEL
ALT: 21 U/L (ref 0–44)
AST: 22 U/L (ref 15–41)
Albumin: 3.3 g/dL — ABNORMAL LOW (ref 3.5–5.0)
Alkaline Phosphatase: 80 U/L (ref 38–126)
Anion gap: 9 (ref 5–15)
BUN: 11 mg/dL (ref 6–20)
CO2: 24 mmol/L (ref 22–32)
Calcium: 8.8 mg/dL — ABNORMAL LOW (ref 8.9–10.3)
Chloride: 102 mmol/L (ref 98–111)
Creatinine, Ser: 0.82 mg/dL (ref 0.61–1.24)
GFR, Estimated: >60 mL/min (ref >60)
Glucose, Bld: 248 mg/dL — ABNORMAL HIGH (ref 70–99)
Potassium: 3.9 mmol/L (ref 3.5–5.1)
Sodium: 135 mmol/L (ref 135–145)
Total Bilirubin: 0.8 mg/dL (ref 0.3–1.2)
Total Protein: 6.6 g/dL (ref 6.5–8.1)

## 2022-03-20 LAB — TROPONIN I (HIGH SENSITIVITY)
Troponin I (High Sensitivity): 41 ng/L — ABNORMAL HIGH (ref <18)
Troponin I (High Sensitivity): 43 ng/L — ABNORMAL HIGH (ref <18)

## 2022-03-20 LAB — CBC WITH DIFFERENTIAL/PLATELET
Abs Immature Granulocytes: 0.05 10*3/uL (ref 0.00–0.07)
Basophils Absolute: 0.1 10*3/uL (ref 0.0–0.1)
Basophils Relative: 1 %
Eosinophils Absolute: 0.3 10*3/uL (ref 0.0–0.5)
Eosinophils Relative: 3 %
HCT: 45.9 % (ref 39.0–52.0)
Hemoglobin: 14.6 g/dL (ref 13.0–17.0)
Immature Granulocytes: 0 %
Lymphocytes Relative: 29 %
Lymphs Abs: 3.4 10*3/uL (ref 0.7–4.0)
MCH: 25.7 pg — ABNORMAL LOW (ref 26.0–34.0)
MCHC: 31.8 g/dL (ref 30.0–36.0)
MCV: 80.8 fL (ref 80.0–100.0)
Monocytes Absolute: 0.9 10*3/uL (ref 0.1–1.0)
Monocytes Relative: 8 %
Neutro Abs: 6.8 10*3/uL (ref 1.7–7.7)
Neutrophils Relative %: 59 %
Platelets: 316 10*3/uL (ref 150–400)
RBC: 5.68 MIL/uL (ref 4.22–5.81)
RDW: 16.2 % — ABNORMAL HIGH (ref 11.5–15.5)
WBC: 11.6 10*3/uL — ABNORMAL HIGH (ref 4.0–10.5)
nRBC: 0 % (ref 0.0–0.2)

## 2022-03-20 LAB — BRAIN NATRIURETIC PEPTIDE: B Natriuretic Peptide: 573.2 pg/mL — ABNORMAL HIGH (ref 0.0–100.0)

## 2022-03-20 LAB — D-DIMER, QUANTITATIVE: D-Dimer, Quant: 0.72 ug/mL-FEU — ABNORMAL HIGH (ref 0.00–0.50)

## 2022-03-20 MED ORDER — FUROSEMIDE 10 MG/ML IJ SOLN
40.0000 mg | Freq: Once | INTRAMUSCULAR | Status: AC
  Administered 2022-03-20: 40 mg via INTRAVENOUS
  Filled 2022-03-20: qty 4

## 2022-03-20 MED ORDER — FUROSEMIDE 40 MG PO TABS: 40.0000 mg | ORAL_TABLET | Freq: Every day | ORAL | 2 refills | Status: DC

## 2022-03-20 MED ORDER — IOHEXOL 350 MG/ML SOLN
100.0000 mL | Freq: Once | INTRAVENOUS | Status: AC | PRN
  Administered 2022-03-20: 100 mL via INTRAVENOUS

## 2022-03-20 NOTE — ED Notes (Signed)
Pt Dc to home. DC instructions reviewed with all questions answered. Pt voices understanding. Iv removed, cath intact, pressure dressing applied. No bleeding noted at site. Pt ambulatory out of dept with steady gait

## 2022-03-20 NOTE — Discharge Instructions (Signed)
You should get a phone call from the heart failure clinic to schedule an appointment.  Also reach out to Dr. [name] to be seen by him in the clinic when he's able.

## 2022-03-21 ENCOUNTER — Encounter: Payer: Self-pay | Admitting: Family

## 2022-03-21 ENCOUNTER — Ambulatory Visit: Payer: 59 | Attending: Family | Admitting: Family

## 2022-03-21 ENCOUNTER — Other Ambulatory Visit: Payer: Self-pay | Admitting: Family

## 2022-03-21 VITALS — BP 130/99 | HR 99 | Ht 71.0 in | Wt 274.0 lb

## 2022-03-21 DIAGNOSIS — G4730 Sleep apnea, unspecified: Secondary | ICD-10-CM | POA: Diagnosis not present

## 2022-03-21 DIAGNOSIS — F17290 Nicotine dependence, other tobacco product, uncomplicated: Secondary | ICD-10-CM | POA: Diagnosis not present

## 2022-03-21 DIAGNOSIS — I252 Old myocardial infarction: Secondary | ICD-10-CM | POA: Insufficient documentation

## 2022-03-21 DIAGNOSIS — E1165 Type 2 diabetes mellitus with hyperglycemia: Secondary | ICD-10-CM

## 2022-03-21 DIAGNOSIS — E785 Hyperlipidemia, unspecified: Secondary | ICD-10-CM | POA: Insufficient documentation

## 2022-03-21 DIAGNOSIS — I5022 Chronic systolic (congestive) heart failure: Secondary | ICD-10-CM | POA: Diagnosis present

## 2022-03-21 DIAGNOSIS — I493 Ventricular premature depolarization: Secondary | ICD-10-CM | POA: Insufficient documentation

## 2022-03-21 DIAGNOSIS — Z79899 Other long term (current) drug therapy: Secondary | ICD-10-CM | POA: Diagnosis not present

## 2022-03-21 DIAGNOSIS — Z955 Presence of coronary angioplasty implant and graft: Secondary | ICD-10-CM | POA: Diagnosis not present

## 2022-03-21 DIAGNOSIS — E119 Type 2 diabetes mellitus without complications: Secondary | ICD-10-CM | POA: Diagnosis not present

## 2022-03-21 DIAGNOSIS — Z7982 Long term (current) use of aspirin: Secondary | ICD-10-CM | POA: Insufficient documentation

## 2022-03-21 DIAGNOSIS — I2510 Atherosclerotic heart disease of native coronary artery without angina pectoris: Secondary | ICD-10-CM

## 2022-03-21 DIAGNOSIS — Z7984 Long term (current) use of oral hypoglycemic drugs: Secondary | ICD-10-CM | POA: Diagnosis not present

## 2022-03-21 DIAGNOSIS — I10 Essential (primary) hypertension: Secondary | ICD-10-CM | POA: Diagnosis not present

## 2022-03-21 DIAGNOSIS — I110 Hypertensive heart disease with heart failure: Secondary | ICD-10-CM | POA: Insufficient documentation

## 2022-03-21 MED ORDER — SPIRONOLACTONE 25 MG PO TABS: 25.0000 mg | ORAL_TABLET | Freq: Every day | ORAL | 5 refills | Status: DC

## 2022-03-21 NOTE — Patient Instructions (Addendum)
Begin weighing daily and call for an overnight weight gain of 3 pounds or more or a weekly weight gain of more than 5 pounds.   Start taking the jardiance as 1 tablet every day.  Begin spironolactone as 1 tablet every morning.    One day next week, go to the [facility] and get your lab work done.    Go to the [place] entrance to the registration desk on March 27th for your echocardiogram.

## 2022-03-21 NOTE — Progress Notes (Addendum)
Patient ID: Jesse [name], male    DOB: February 04, 1970, 52 y.o.   MRN: [id number]  HPI  [name] is a 52 y/o male with a history of CAD, DM, hyperlipidemia, HTN, sleep apnea, previous tobacco use and chronic heart failure.   Echo 08/15/20 showed an EF of 45-50%  Stress test 07/2020: Abnormal myocardial perfusion scan. Mildly depressed left ventricular function mild inferior hypoejection fraction 45% there is evidence of what appears to be fixed defect in the inferior segment but no evidence of reversibility to segment mid represent scar versus artifact.   LHC done 07/05/16 and showed: Mid RCA to Dist RCA lesion, 100 %stenosed. RPDA lesion, 90 %stenosed. Dist Cx lesion, 50 %stenosed. 4th Mrg lesion, 75 %stenosed. Mid LAD to Dist LAD lesion, 20 %stenosed. A STENT XIENCE ALPINE RX [id number] drug eluting stent was successfully placed, and overlaps previously placed stent. A STENT XIENCE ALPINE RX [id number] stent was successfully placed. Dist RCA lesion, 100 %stenosed. Post intervention, there is a 0% residual stenosis. There is mild left ventricular systolic dysfunction. LV end diastolic pressure is normal. The left ventricular ejection fraction is 45-50% by visual estimate.  Conclusion STEMI inferior wall Successful PCI stent to mid RCA deployment of 2 overlapping DES stents reducing lesion from 90% down to 0  Was in the ED 03/19/22 due to SOB and weight gain. D dimer elevated so CTA done but no PE or pneumonia. Was in the ED 01/14/22 due to influenza.   He presents today for his initial HF Clinic visit with a chief complaint of minimal SOB with moderate exertion. Describes this as chronic in nature. Has associated fatigue, chest tightness, weight gain (~ 25 pounds in the last 3 months) pedal edema (improving), palpitations and chronic difficulty sleeping along with this. Denies any abdominal distention, chest pain or dizziness.  Had stopped some of his medications because he was having some  visual issues but he didn't know what medication was causing it.   Is concerned because he's been on plavix since his stent in 2005 and continued this after 2018 stent but says that he was told by his cardiologist last fall to stop the plavix and just take '81mg'$  ASA.   Past Medical History:  Diagnosis Date   CHF (congestive heart failure) (HCC)    Coronary artery disease    Inferior myocardial infarction in 2004.  He was treated with thrombolytics at [facility].  He then had an inferior MI in March 2007.  He received a Horizon study stent 3.5 x 20 mm in the distal RCA at [facility].  No other obstructive disease.   Diabetes mellitus    TYPE II   Erectile dysfunction    Hyperlipidemia    Hypertension    MI (myocardial infarction) ([place]) [id number]   Morbid obesity ([place])    Sleep apnea    Past Surgical History:  Procedure Laterality Date   CARDIAC CATHETERIZATION     @ [place] CATH AND CORONARY ANGIOGRAPHY N/A 07/05/2016   Procedure: Left Heart Cath and Coronary Angiography;  Surgeon: Yolonda Kida, MD;  Location: [facility];  Service: Cardiovascular;  Laterality: N/A;   SEPTOPLASTY     Family History  Adopted: Yes   Social History   Tobacco Use   Smoking status: Former    Packs/day: 1.00    Years: 15.00    Total pack years: 15.00    Types: Cigars, Cigarettes  Quit date: 2020    Years since quitting: 4.1   Smokeless tobacco: Never  Substance Use Topics   Alcohol use: No   No Known Allergies Prior to Admission medications   Medication Sig Start Date End Date Taking? Authorizing Provider  albuterol (VENTOLIN HFA) 108 (90 Base) MCG/ACT inhaler Inhale 2-4 puffs by mouth every 4 hours as needed for wheezing, cough, and/or shortness of breath 01/14/22  Yes Hinda Kehr, MD  aspirin 81 MG EC tablet Take 1 tablet (81 mg total) by mouth daily. 07/06/16  Yes Gladstone Lighter, MD  atorvastatin (LIPITOR) 40 MG tablet Take 80 mg by mouth  daily.    Yes [provider]  Budeson-Glycopyrrol-Formoterol (BREZTRI AEROSPHERE) 160-9-4.8 MCG/ACT AERO Inhale 2 puffs into the lungs daily.   Yes [provider]  furosemide (LASIX) 40 MG tablet Take 1 tablet (40 mg total) by mouth daily. 03/20/22 03/20/23 Yes Vladimir Crofts, MD  lisinopril (PRINIVIL,ZESTRIL) 10 MG tablet Take 1 tablet (10 mg total) by mouth daily. 07/07/16  Yes Gladstone Lighter, MD  AMBULATORY NON FORMULARY MEDICATION Trimix (30/1/10)-(Pap/Phent/PGE)  Test Dose  29m vial   Qty #3 [phone] [phone]/23/23   MZara CouncilA, PA-C  blood glucose meter kit and supplies KIT Dispense based on patient and insurance preference. Use up to four times daily as directed. (FOR ICD-9 250.00, 250.01). 03/08/19   Danford, CSuann Larry MD  clopidogrel (PLAVIX) 75 MG tablet Take 1 tablet (75 mg total) by mouth daily. Patient not taking: Reported on 03/21/2022 07/06/16   KGladstone Lighter MD  Dulaglutide 0.75 MG/0.5ML SOPN Inject 0.75 mg into the skin once a week. Patient not taking: Reported on 03/21/2022    [provider]  empagliflozin (JARDIANCE) 25 MG TABS tablet Take by mouth daily. Patient not taking: Reported on 03/21/2022    [provider]  metoprolol tartrate (LOPRESSOR) 25 MG tablet Take 1 tablet (25 mg total) by mouth 2 (two) times daily. Patient not taking: Reported on 03/21/2022 07/06/16   KGladstone Lighter MD  nitroGLYCERIN (NITROSTAT) 0.4 MG SL tablet Place 1 tablet (0.4 mg total) under the tongue every 5 (five) minutes as needed for chest pain. Patient not taking: Reported on 03/21/2022 02/28/17   MBettey Costa MD  ondansetron (ZOFRAN ODT) 4 MG disintegrating tablet Take 1 tablet (4 mg total) by mouth every 8 (eight) hours as needed for nausea or vomiting. Patient not taking: Reported on 03/21/2022 05/02/20   JBlake Divine MD  tadalafil (CIALIS) 20 MG tablet 1 tablet 1 hour prior to  intercourse Patient not taking: Reported on 03/21/2022 03/18/19   SAbbie Sons MD  TRADJENTA 5 MG TABS tablet Take 5 mg by mouth daily. Patient not taking: Reported on 03/21/2022 02/22/17   [provider]  traMADol (ULTRAM) 50 MG tablet Take 50 mg by mouth every 6 (six) hours as needed for pain. Patient not taking: Reported on 03/21/2022 02/13/17   [provider]   Review of Systems  Constitutional:  Positive for fatigue. Negative for appetite change.  HENT:  Negative for congestion, postnasal drip and sore throat.   Eyes: Negative.   Respiratory:  Positive for chest tightness and shortness of breath. Negative for cough and wheezing.   Cardiovascular:  Positive for palpitations and leg swelling. Negative for chest pain.  Gastrointestinal:  Negative for abdominal distention and abdominal pain.  Endocrine: Negative.   Genitourinary: Negative.   Musculoskeletal:  Negative for back pain and neck pain.  Skin: Negative.   Allergic/Immunologic: Negative.   Neurological:  Negative for dizziness and light-headedness.  Hematological:  Negative for adenopathy. Does not bruise/bleed easily.  Psychiatric/Behavioral:  Positive for sleep disturbance (due to SOB last night). Negative for dysphoric mood. The patient is not nervous/anxious.    Vitals:   03/21/22 1016 03/21/22 1054  BP: (!) 130/99   Pulse: (!) 44 99  SpO2: 99%   Weight: 274 lb (124.3 kg)   Height: '5\' 11"'$  (1.803 m)    Wt Readings from Last 3 Encounters:  03/21/22 274 lb (124.3 kg)  03/19/22 265 lb (120.2 kg)  01/13/22 251 lb (113.9 kg)    Lab Results  Component Value Date   CREATININE 0.82 03/19/2022   CREATININE 0.66 01/13/2022   CREATININE 0.74 10/07/2021    Physical Exam Vitals and nursing note reviewed.  Constitutional:      Appearance: Normal appearance.  HENT:     Head: Normocephalic and atraumatic.  Cardiovascular:     Rate and Rhythm: Normal rate and regular rhythm.  Pulmonary:     Effort:  Pulmonary effort is normal. No respiratory distress.     Breath sounds: No wheezing or rales.  Abdominal:     General: There is no distension.     Palpations: Abdomen is soft.     Tenderness: There is no abdominal tenderness.  Musculoskeletal:        General: No tenderness.     Cervical back: Normal range of motion and neck supple.     Right lower leg: Edema (trace pitting) present.     Left lower leg: Edema (trace pitting) present.  Skin:    General: Skin is warm and dry.  Neurological:     General: No focal deficit present.     Mental Status: He is alert and oriented to person, place, and time.  Psychiatric:        Mood and Affect: Mood normal.        Behavior: Behavior normal.        Thought Content: Thought content normal.   Assessment & Plan:  1: Chronic heart failure with mildly reduced ejection fraction- - NYHA class II - euvolemic today - instructed to start weighing daily and call for an overnight weight gain of > 2 pounds or a weekly weight gain of > 5 pounds - not adding salt to his food - drinks 60-80 oz of pepsi daily; no water. Reviewed keeping daily fluid intake to 60-64 oz with some of that being water - saw pulmonology Jesse [name]) 03/14/22 - echo has been scheduled for 04/17/22 - ReDs clip reading was normal at 32% - furosemide '40mg'$  daily - resume jardiance '25mg'$  daily (DM dose) - begin spironolactone '25mg'$  daily - BMP next week and again @ next visit - initially bradycardic so EKG done; unchanged from previous one - ischemic heart disease; refer to [facility] provider for further evaluation - echo 08/15/20 showed an EF of 45-50% - stress test 07/2020: Abnormal myocardial perfusion scan. Mildly depressed left ventricular function mild inferior hypoejection fraction 45% there is evidence of what appears to be fixed defect in the inferior segment but no evidence of reversibility to segment mid represent scar versus artifact.  - LHC done 07/05/16 and showed multivessel  disease - BNP 03/19/22 was 573.2  2: HTN- - BP 130/99; starting spironolactone per above - sees PCP Jesse [name]) @ [facility] - BMP 03/19/22 showed sodium 135, potassium 3.9, creatinine 0.82 & GFR >60  3: DM- - A1c 03/06/19  was 11.5% - resuming jardiance but will most likely need other treatment by PCP  4: CAD-  - RCA stent placed 06/2016 - saw cardiology Jesse [name]) 10/30/21 - no cigars in the last 2 days; continued cessation emphasized - stopped taking plavix last fall and now takes '81mg'$  ASA   Medication bottles reviewed.   Return in 1 month after echo, sooner if needed.

## 2022-04-02 ENCOUNTER — Ambulatory Visit: Payer: 59 | Attending: Otolaryngology

## 2022-04-14 ENCOUNTER — Emergency Department
Admission: EM | Admit: 2022-04-14 | Discharge: 2022-04-14 | Disposition: A | Discharge status: Home / Self Care | Payer: 59 | Attending: Emergency Medicine | Admitting: Emergency Medicine

## 2022-04-14 ENCOUNTER — Emergency Department: Payer: 59

## 2022-04-14 ENCOUNTER — Other Ambulatory Visit: Payer: Self-pay

## 2022-04-14 DIAGNOSIS — Z1152 Encounter for screening for COVID-19: Secondary | ICD-10-CM | POA: Diagnosis not present

## 2022-04-14 DIAGNOSIS — R0602 Shortness of breath: Secondary | ICD-10-CM | POA: Diagnosis present

## 2022-04-14 DIAGNOSIS — I509 Heart failure, unspecified: Secondary | ICD-10-CM

## 2022-04-14 DIAGNOSIS — E119 Type 2 diabetes mellitus without complications: Secondary | ICD-10-CM | POA: Insufficient documentation

## 2022-04-14 DIAGNOSIS — I5043 Acute on chronic combined systolic (congestive) and diastolic (congestive) heart failure: Secondary | ICD-10-CM | POA: Insufficient documentation

## 2022-04-14 DIAGNOSIS — Z7902 Long term (current) use of antithrombotics/antiplatelets: Secondary | ICD-10-CM | POA: Insufficient documentation

## 2022-04-14 DIAGNOSIS — I10 Essential (primary) hypertension: Secondary | ICD-10-CM | POA: Insufficient documentation

## 2022-04-14 LAB — CBC WITH DIFFERENTIAL/PLATELET
Abs Immature Granulocytes: 0.03 10*3/uL (ref 0.00–0.07)
Basophils Absolute: 0.1 10*3/uL (ref 0.0–0.1)
Basophils Relative: 1 %
Eosinophils Absolute: 0.5 10*3/uL (ref 0.0–0.5)
Eosinophils Relative: 5 %
HCT: 51.7 % (ref 39.0–52.0)
Hemoglobin: 16.2 g/dL (ref 13.0–17.0)
Immature Granulocytes: 0 %
Lymphocytes Relative: 31 %
Lymphs Abs: 2.7 10*3/uL (ref 0.7–4.0)
MCH: 25.4 pg — ABNORMAL LOW (ref 26.0–34.0)
MCHC: 31.3 g/dL (ref 30.0–36.0)
MCV: 81.2 fL (ref 80.0–100.0)
Monocytes Absolute: 0.9 10*3/uL (ref 0.1–1.0)
Monocytes Relative: 10 %
Neutro Abs: 4.5 10*3/uL (ref 1.7–7.7)
Neutrophils Relative %: 53 %
Platelets: 341 10*3/uL (ref 150–400)
RBC: 6.37 MIL/uL — ABNORMAL HIGH (ref 4.22–5.81)
RDW: 16.7 % — ABNORMAL HIGH (ref 11.5–15.5)
WBC: 8.5 10*3/uL (ref 4.0–10.5)
nRBC: 0 % (ref 0.0–0.2)

## 2022-04-14 LAB — TROPONIN I (HIGH SENSITIVITY)
Troponin I (High Sensitivity): 42 ng/L — ABNORMAL HIGH (ref <18)
Troponin I (High Sensitivity): 42 ng/L — ABNORMAL HIGH (ref <18)

## 2022-04-14 LAB — COMPREHENSIVE METABOLIC PANEL
ALT: 21 U/L (ref 0–44)
AST: 24 U/L (ref 15–41)
Albumin: 3.3 g/dL — ABNORMAL LOW (ref 3.5–5.0)
Alkaline Phosphatase: 99 U/L (ref 38–126)
Anion gap: 5 (ref 5–15)
BUN: 14 mg/dL (ref 6–20)
CO2: 26 mmol/L (ref 22–32)
Calcium: 8.2 mg/dL — ABNORMAL LOW (ref 8.9–10.3)
Chloride: 100 mmol/L (ref 98–111)
Creatinine, Ser: 0.6 mg/dL — ABNORMAL LOW (ref 0.61–1.24)
GFR, Estimated: >60 mL/min (ref >60)
Glucose, Bld: 178 mg/dL — ABNORMAL HIGH (ref 70–99)
Potassium: 3.7 mmol/L (ref 3.5–5.1)
Sodium: 131 mmol/L — ABNORMAL LOW (ref 135–145)
Total Bilirubin: 1 mg/dL (ref 0.3–1.2)
Total Protein: 7.2 g/dL (ref 6.5–8.1)

## 2022-04-14 LAB — RESP PANEL BY RT-PCR (RSV, FLU A&B, COVID)  RVPGX2
Influenza A by PCR: NEGATIVE
Influenza B by PCR: NEGATIVE
Resp Syncytial Virus by PCR: NEGATIVE
SARS Coronavirus 2 by RT PCR: NEGATIVE

## 2022-04-14 LAB — BRAIN NATRIURETIC PEPTIDE: B Natriuretic Peptide: 518 pg/mL — ABNORMAL HIGH (ref 0.0–100.0)

## 2022-04-14 MED ORDER — FUROSEMIDE 10 MG/ML IJ SOLN
40.0000 mg | Freq: Once | INTRAMUSCULAR | Status: AC
  Administered 2022-04-14: 40 mg via INTRAVENOUS
  Filled 2022-04-14: qty 4

## 2022-04-14 MED ORDER — FUROSEMIDE 40 MG PO TABS: 80.0000 mg | ORAL_TABLET | Freq: Every day | ORAL | 3 refills | Status: DC

## 2022-04-14 MED ORDER — POTASSIUM CHLORIDE CRYS ER 20 MEQ PO TBCR
40.0000 meq | EXTENDED_RELEASE_TABLET | Freq: Once | ORAL | Status: AC
  Administered 2022-04-14: 40 meq via ORAL
  Filled 2022-04-14: qty 2

## 2022-04-14 NOTE — ED Notes (Signed)
Pt verbalizes understanding of discharge instructions. Opportunity for questioning and answers were provided. Pt discharged from ED to home with family.    

## 2022-04-14 NOTE — ED Provider Notes (Signed)
[facility] Provider Note    Event Date/Time   First MD Initiated Contact with Patient 04/14/22 1335     (approximate)   History   Shortness of Breath   HPI  Jesse [name] is a 52 y.o. male with history of coronary disease status post stenting on Plavix, diabetes, hypertension who comes in with shortness of breath.  On review of records patient was seen on 2/27 for shortness of breath.  He had CT imaging that showed cardiomegaly with pulmonary edema but no evidence of pulmonary embolism.  He was considered admission but ended up going home with close follow-up on Lasix.  He reports not having much urine output with the Lasix and having worsening swelling with some mild chest discomfort and worsening shortness of breath over the past few days.      Physical Exam   Triage Vital Signs: ED Triage Vitals [04/14/22 1332]  Enc Vitals Group     BP      Pulse      Resp      Temp      Temp src      SpO2      Weight 265 lb (120.2 kg)     Height 5\' 11"  (1.803 m)     Head Circumference      Peak Flow      Pain Score 3     Pain Loc      Pain Edu?      Excl. in [place]?     Most recent vital signs: Vitals:   04/14/22 1338 04/14/22 1340  BP:  122/80  Pulse: 99 (!) 103  Resp: (!) 22 (!) 22  Temp: 98.3 F (36.8 C)   SpO2: 93% 100%     General: Awake, no distress.  CV:  Good peripheral perfusion.  Resp:  Normal effort.  Abd:  No distention.  Other:     ED Results / Procedures / Treatments   Labs (all labs ordered are listed, but only abnormal results are displayed) Labs Reviewed  CBC WITH DIFFERENTIAL/PLATELET - Abnormal; Notable for the following components:      Result Value   RBC 6.37 (*)    MCH 25.4 (*)    RDW 16.7 (*)    All other components within normal limits  COMPREHENSIVE METABOLIC PANEL - Abnormal; Notable for the following components:   Sodium 131 (*)    Glucose, Bld 178 (*)    Creatinine, Ser 0.60 (*)    Calcium 8.2 (*)     Albumin 3.3 (*)    All other components within normal limits  BRAIN NATRIURETIC PEPTIDE - Abnormal; Notable for the following components:   B Natriuretic Peptide 518.0 (*)    All other components within normal limits  TROPONIN I (HIGH SENSITIVITY) - Abnormal; Notable for the following components:   Troponin I (High Sensitivity) 42 (*)    All other components within normal limits  RESP PANEL BY RT-PCR (RSV, FLU A&B, COVID)  [id number]     EKG  My interpretation of EKG:  Sinus tachycardia rate of 107 without any ST elevations, some T wave flattening, occasional PVCs, normal intervals  RADIOLOGY I have reviewed the xray personally and and interpreted and no evidence of any edema  PROCEDURES:  Critical Care performed: No  .1-3 Lead EKG Interpretation  Performed by: Vanessa [place], MD Authorized by: Vanessa [place], MD     Interpretation: abnormal     ECG rate:  100  ECG rate assessment: tachycardic     Rhythm: sinus tachycardia     Ectopy: PVCs     Conduction: normal      MEDICATIONS ORDERED IN ED: Medications  furosemide (LASIX) injection 40 mg (has no administration in time range)     IMPRESSION / MDM / ASSESSMENT AND PLAN / ED COURSE  I reviewed the triage vital signs and the nursing notes.   Patient's presentation is most consistent with acute presentation with potential threat to life or bodily function.   Differential is most likely CHF consider workup for PE but has had a reassuring CT scan with less than a months and the biggest concern is increasing swelling and not having any urine output from the Lasix.  Consider COVID, flu, ACS.  COVID, flu were negative.  CBC is reassuring.  CMP shows stable creatinine slightly low sodium which could be from fluid overload.  Troponin is similar to a few days ago, BNP is slightly decreased.  Patient will be given a dose of IV Lasix and do an ambulatory trial repeat trop and have further discussion with patient about possible  discharge versus admission based upon symptoms.  Will consider increasing Lasix up to 80 mg given he reports good urine output with the 40 of IV Lasix that was given last time he was here.  Patient handed off to oncoming team pending these results  The patient is on the cardiac monitor to evaluate for evidence of arrhythmia and/or significant heart rate changes.      FINAL CLINICAL IMPRESSION(S) / ED DIAGNOSES   Final diagnoses:  Acute on chronic congestive heart failure, unspecified heart failure type ([place])     Rx / DC Orders   ED Discharge Orders     None        Note:  This document was prepared using Dragon voice recognition software and may include unintentional dictation errors.   Vanessa [place], MD 04/14/22 [phone]

## 2022-04-14 NOTE — ED Notes (Signed)
Ambulatory O2 saturation 96% on RA. MD notified.

## 2022-04-14 NOTE — ED Provider Notes (Signed)
I was asked to follow-up on a DVT ultrasound, repeat troponin, and ambulatory saturations for this patient with CHF.  He feels well after diuresis in the emergency department.  His DVT ultrasound is negative and his troponin is flat and his ambulatory O2 saturations are normal.  The plan will be for discharge with cardiology follow-up and increase Lasix to 80 mg daily.  He will return with any new or worsening symptoms.   Lucillie Garfinkel, MD 04/14/22 1800

## 2022-04-14 NOTE — Discharge Instructions (Signed)
I sent a refill of your Lasix, take 80 mg daily to help with getting off extra fluid to help you breathe better.  Call your cardiologist for a follow-up appointment this week.  Thank you for choosing [place] for your health care today!  Please see your primary doctor this week for a follow up appointment.   Sometimes, in the early stages of certain disease courses it is difficult to detect in the emergency department evaluation -- so, it is important that you continue to monitor your symptoms and call your doctor right away or return to the emergency department if you develop any new or worsening symptoms.  Please go to the following website to schedule new (and existing) patient appointments:   [email or web address]  If you do not have a primary doctor try calling the following clinics to establish care:  If you have insurance:  [facility] [phone] [place] [address]   [facility]  [phone] [place] [place] [address]   If you do not have insurance:  [facility]  [phone] [address] [place] [address]   The following is another list of primary care offices in the area who are accepting new patients at this time.  Please reach out to one of them directly and let them know you would like to schedule an appointment to follow up on an Emergency Department visit, and/or to establish a new primary care provider (PCP).  There are likely other primary care clinics in the are who are accepting new patients, but this is an excellent place to start:  [facility] physician: Dr Lavon Paganini [address] [place], [place] [address] [phone]  [facility] Lead Physician: Dr Steele Sizer [address], [place], [place] [address] [phone]  [place] Physician: Dr Park Liter [address] [place], [place] [address] [phone]  [facility] Lead Physician: Dr Dewaine Oats [place], [place], [place] [address] [phone]  [facility] Physician: Dr Halina Maidens [address] [place] Bro[place], [place] [address] [phone]   It was my pleasure to care for you today.   [place] Jacelyn Grip, MD

## 2022-04-14 NOTE — ED Triage Notes (Signed)
Pt to ED via POV from home. Pt reports increased SOB the last few days. Pt recently dx with CHF and placed on lasix. Pt reports minimal urine output at home. Pt also reports feels like swelling is worse and mild CP.

## 2022-04-17 ENCOUNTER — Ambulatory Visit
Admission: RE | Admit: 2022-04-17 | Discharge: 2022-04-17 | Disposition: A | Discharge status: Home / Self Care | Payer: 59 | Source: Ambulatory Visit | Attending: Family | Admitting: Family

## 2022-04-17 DIAGNOSIS — I5022 Chronic systolic (congestive) heart failure: Secondary | ICD-10-CM | POA: Insufficient documentation

## 2022-04-17 DIAGNOSIS — E785 Hyperlipidemia, unspecified: Secondary | ICD-10-CM | POA: Insufficient documentation

## 2022-04-17 DIAGNOSIS — E119 Type 2 diabetes mellitus without complications: Secondary | ICD-10-CM | POA: Insufficient documentation

## 2022-04-17 DIAGNOSIS — I252 Old myocardial infarction: Secondary | ICD-10-CM | POA: Insufficient documentation

## 2022-04-17 DIAGNOSIS — I110 Hypertensive heart disease with heart failure: Secondary | ICD-10-CM | POA: Insufficient documentation

## 2022-04-17 LAB — ECHOCARDIOGRAM COMPLETE
AR max vel: 2.65 cm2
AV Area VTI: 2.78 cm2
AV Area mean vel: 2.6 cm2
AV Mean grad: 2 mmHg
AV Peak grad: 2.9 mmHg
Ao pk vel: 0.85 m/s
Area-P 1/2: 5.34 cm2
S' Lateral: 3.8 cm

## 2022-04-17 NOTE — Progress Notes (Signed)
*  PRELIMINARY RESULTS* Echocardiogram 2D Echocardiogram has been performed.  Sherrie Sport 04/17/2022, 10:25 AM

## 2022-04-18 ENCOUNTER — Telehealth: Payer: Self-pay | Admitting: *Deleted

## 2022-04-18 MED ORDER — POTASSIUM CHLORIDE CRYS ER 20 MEQ PO TBCR: EXTENDED_RELEASE_TABLET | ORAL | 0 refills | Status: DC

## 2022-04-18 MED ORDER — METOLAZONE 2.5 MG PO TABS: ORAL_TABLET | ORAL | 0 refills | Status: DC

## 2022-04-18 NOTE — Telephone Encounter (Signed)
Spoke with pt he is aware of Tina's advice to take metolazone 2.5mg  daily for two days with 27meq of potassium 2min before taking lasix.

## 2022-04-18 NOTE — Telephone Encounter (Signed)
Pts wife called stating pt is experiencing more swelling in his feet and ankles. He is also c/o being more short of breath with exertion. Pt has an appointment with  Dr.Bensimhon on 04/23/22 but wife asked if he needed to make any med changes or come in for a visit sooner. Pt currently takes lasix 80mg  daily.    Routed to [employer] for advice

## 2022-04-21 ENCOUNTER — Other Ambulatory Visit: Payer: Self-pay | Admitting: Family

## 2022-04-22 NOTE — Progress Notes (Signed)
ADVANCED HF CLINIC CONSULT NOTE  Referring Physician: Darylene Price, NP Primary Care: [name], MD Primary Cardiologist: Dr. [name]  HPI:  Jesse [name] is a 52 y/o male with a history of CAD, DM2, hyperlipidemia, HTN, OSA, previous tobacco use and chronic heart failure referred by Darylene Price, NP for further evaluation of HF.   Reports CAD with NSTEMI in 2004 and 2007.  Had inferior STEMI in 6/18. Cath with LAD 20% LCx 50%d. OM4 75% RCA 100%m  -> PCI with 2 overlapping DES stents   Echo 08/15/20 showed an EF of 45-50%   Stress test 07/2020: Abnormal myocardial perfusion scan. Mildly depressed left ventricular function mild inferior hypoejection fraction 45% there is evidence of what appears to be fixed defect in the inferior segment but no evidence of reversibility to segment mid represent scar versus artifact.    Was in the ED 03/19/22 due to SOB and weight gain. D dimer elevated so CTA done but no PE or pneumonia.   Seen in HF clinic 03/21/22 with dyspnea on mild exertion with 25 pound weight gain in 3 months after being out of med. Volume OK ReDs 32%. Resumed Jardiance. Started spiro  Echo 04/17/22 EF 35-40% moderate LVH RV low normal  Says he feels tired all the time. Wakes up often SOB. + orthopnea or PND. + snoring, No CP. Edema much improved  Says he lost weight from 398 pounds -> 256 in last 18 months  Review of Systems: [y] = yes, [ ]  = no   General: Weight gain [ ] ; Weight loss [ ] ; Anorexia [ ] ; Fatigue [ y]; Fever [ ] ; Chills [ ] ; Weakness [ ]   Cardiac: Chest pain/pressure [ ] ; Resting SOB [ y]; Exertional SOB [ y]; Orthopnea [ ] ; Pedal Edema [ ] y; Palpitations [ ] ; Syncope [ ] ; Presyncope [ ] ; Paroxysmal nocturnal dyspnea[ ]   Pulmonary: Cough [ ] ; Wheezing[ ] ; Hemoptysis[ ] ; Sputum [ ] ; Snoring Blue.Reese ]  GI: Vomiting[ ] ; Dysphagia[ ] ; Melena[ ] ; Hematochezia [ ] ; Heartburn[ ] ; Abdominal pain [ ] ; Constipation [ ] ; Diarrhea [ ] ; BRBPR [ ]   GU: Hematuria[ ] ; Dysuria [ ] ;  Nocturia[ ]   Vascular: Pain in legs with walking [ ] ; Pain in feet with lying flat [ ] ; Non-healing sores [ ] ; Stroke [ ] ; TIA [ ] ; Slurred speech [ ] ;  Neuro: Headaches[ ] ; Vertigo[ ] ; Seizures[ ] ; Paresthesias[ ] ;Blurred vision [ ] ; Diplopia [ ] ; Vision changes [ ]   Ortho/Skin: Arthritis Blue.Reese ]; Joint pain Blue.Reese ]; Muscle pain [ ] ; Joint swelling [ ] ; Back Pain [ ] ; Rash [ ]   Psych: Depression[ ] ; Anxiety[ ]   Heme: Bleeding problems [ ] ; Clotting disorders [ ] ; Anemia [ ]   Endocrine: Diabetes [ y]; Thyroid dysfunction[ ]    Past Medical History:  Diagnosis Date   CHF (congestive heart failure)    Coronary artery disease    Inferior myocardial infarction in 2004.  He was treated with thrombolytics at [facility].  He then had an inferior MI in March 2007.  He received a Horizon study stent 3.5 x 20 mm in the distal RCA at [facility].  No other obstructive disease.   Diabetes mellitus    TYPE II   Erectile dysfunction    Hyperlipidemia    Hypertension    MI (myocardial infarction) [id number]   Morbid obesity    Sleep apnea     Current Outpatient Medications  Medication Sig Dispense Refill   albuterol (VENTOLIN HFA) 108 (90  Base) MCG/ACT inhaler Inhale 2-4 puffs by mouth every 4 hours as needed for wheezing, cough, and/or shortness of breath 6.7 g 0   aspirin 81 MG EC tablet Take 1 tablet (81 mg total) by mouth daily. 30 tablet 12   atorvastatin (LIPITOR) 40 MG tablet Take 80 mg by mouth daily.      Budeson-Glycopyrrol-Formoterol (BREZTRI AEROSPHERE) 160-9-4.8 MCG/ACT AERO Inhale 2 puffs into the lungs daily.     empagliflozin (JARDIANCE) 25 MG TABS tablet Take by mouth daily.     furosemide (LASIX) 40 MG tablet Take 2 tablets (80 mg total) by mouth daily. 180 tablet 3   lisinopril (PRINIVIL,ZESTRIL) 10 MG tablet Take 1 tablet (10 mg total) by mouth daily. 30 tablet 2   spironolactone (ALDACTONE) 25 MG tablet Take 1 tablet (25 mg total) by mouth daily. 30 tablet 5   TRADJENTA 5 MG TABS tablet Take  5 mg by mouth daily.     traMADol (ULTRAM) 50 MG tablet Take 50 mg by mouth every 6 (six) hours as needed for pain.     AMBULATORY NON FORMULARY MEDICATION Trimix (30/1/10)-(Pap/Phent/PGE)  Test Dose  64ml vial   Qty #3 Refills 0  [facility] [phone] Fax [phone] 3 mL 0   blood glucose meter kit and supplies KIT Dispense based on patient and insurance preference. Use up to four times daily as directed. (FOR ICD-9 250.00, 250.01). 1 each 0   nitroGLYCERIN (NITROSTAT) 0.4 MG SL tablet Place 1 tablet (0.4 mg total) under the tongue every 5 (five) minutes as needed for chest pain. (Patient not taking: Reported on 03/21/2022) 30 tablet 0   tadalafil (CIALIS) 20 MG tablet 1 tablet 1 hour prior to intercourse (Patient not taking: Reported on 03/21/2022) 5 tablet 0   No current facility-administered medications for this visit.    No Known Allergies    Social History   Socioeconomic History   Marital status: Married    Spouse name: Not on file   Number of children: Not on file   Years of education: Not on file   Highest education level: Not on file  Occupational History   Not on file  Tobacco Use   Smoking status: Former    Packs/day: 1.00    Years: 15.00    Additional pack years: 0.00    Total pack years: 15.00    Types: Cigars, Cigarettes    Quit date: 2020    Years since quitting: 4.2   Smokeless tobacco: Never  Vaping Use   Vaping Use: Never used  Substance and Sexual Activity   Alcohol use: No   Drug use: No    Comment: prior hx of cocaine and marijuana   Sexual activity: Not on file  Other Topics Concern   Not on file  Social History Narrative   Not on file   Social Determinants of Health   Financial Resource Strain: Not on file  Food Insecurity: Not on file  Transportation Needs: Not on file  Physical Activity: Not on file  Stress: Not on file  Social Connections: Not on file  Intimate Partner Violence: Not on file      Family History   Adopted: Yes    Vitals:   04/23/22 1004  BP: 120/80  Pulse: 93  SpO2: 99%  Weight: 256 lb (116.1 kg)    PHYSICAL EXAM: General:  Well appearing. No respiratory difficulty HEENT: normal Neck: supple. no JVD. Carotids 2+ bilat; no bruits. No lymphadenopathy or thryomegaly appreciated. Cor:  PMI nondisplaced. Regular rate & rhythm. No rubs, gallops or murmurs. Lungs: clear Abdomen: obese, soft, nontender, nondistended. No hepatosplenomegaly. No bruits or masses. Good bowel sounds. Extremities: no cyanosis, clubbing, rash, edema Neuro: alert & oriented x 3, cranial nerves grossly intact. moves all 4 extremities w/o difficulty. Affect pleasant.  ECG: SR 93 with PVCs 1AVB 206 ms anterolateral qs Personally reviewed   ASSESSMENT & PLAN:  1: Chronic systolic HF due to iCM - s/p Inferior STEMI in 6/18. Cath with LAD 20% LCx 50%d. OM4 75% RCA 100%m  -> PCI with 2 overlapping DES stents - Echo 08/15/20 showed an EF of 45-50% - Myoview 07/22: EF45% inferior scar. No ischemia  - Echo 04/17/22 EF 35-40% moderate LVH RV low normal - NYHA class II-III - Volume status up on furosemide 80mg  daily - Continue jardiance 25mg  daily (DM dose) - Continue  spironolactone 25mg  daily - Continue lisinopril (eventual switch to [employer])  - Start Toprol 25   2: PVCs - Place Zio to evaluate - Needs sleep study  3. Snoring/fatigue - high suspicion for OSA - home sleep study  4. DM2 - A1c 7.3 in 3/24 - Continue Jardiance - Managed by PCP   5: CAD-  - RCA stent placed 06/2016 - saw cardiology [name]) 10/30/21 - continue ASA/statin - f/u Dr. [name].as scheduled   6. Tobacco use - smoking a pack of cigars per day - encouraged cessation    Glori Bickers, MD  10:42 AM

## 2022-04-23 ENCOUNTER — Other Ambulatory Visit: Payer: Self-pay | Admitting: Internal Medicine

## 2022-04-23 ENCOUNTER — Encounter: Payer: Self-pay | Admitting: Internal Medicine

## 2022-04-23 ENCOUNTER — Ambulatory Visit (HOSPITAL_BASED_OUTPATIENT_CLINIC_OR_DEPARTMENT_OTHER): Payer: 59 | Admitting: Internal Medicine

## 2022-04-23 ENCOUNTER — Other Ambulatory Visit (HOSPITAL_BASED_OUTPATIENT_CLINIC_OR_DEPARTMENT_OTHER): Payer: 59

## 2022-04-23 ENCOUNTER — Other Ambulatory Visit
Admission: RE | Admit: 2022-04-23 | Discharge: 2022-04-23 | Disposition: A | Discharge status: Home / Self Care | Payer: 59 | Source: Ambulatory Visit | Attending: Internal Medicine | Admitting: Internal Medicine

## 2022-04-23 VITALS — BP 120/80 | HR 93 | Wt 256.0 lb

## 2022-04-23 DIAGNOSIS — R0683 Snoring: Secondary | ICD-10-CM | POA: Diagnosis not present

## 2022-04-23 DIAGNOSIS — I493 Ventricular premature depolarization: Secondary | ICD-10-CM | POA: Diagnosis not present

## 2022-04-23 DIAGNOSIS — I5022 Chronic systolic (congestive) heart failure: Secondary | ICD-10-CM | POA: Insufficient documentation

## 2022-04-23 DIAGNOSIS — I2510 Atherosclerotic heart disease of native coronary artery without angina pectoris: Secondary | ICD-10-CM

## 2022-04-23 LAB — BASIC METABOLIC PANEL
Anion gap: 8 (ref 5–15)
BUN: 14 mg/dL (ref 6–20)
CO2: 28 mmol/L (ref 22–32)
Calcium: 8.9 mg/dL (ref 8.9–10.3)
Chloride: 96 mmol/L — ABNORMAL LOW (ref 98–111)
Creatinine, Ser: 0.71 mg/dL (ref 0.61–1.24)
GFR, Estimated: >60 mL/min (ref >60)
Glucose, Bld: 222 mg/dL — ABNORMAL HIGH (ref 70–99)
Potassium: 4.3 mmol/L (ref 3.5–5.1)
Sodium: 132 mmol/L — ABNORMAL LOW (ref 135–145)

## 2022-04-23 LAB — BRAIN NATRIURETIC PEPTIDE: B Natriuretic Peptide: 443.6 pg/mL — ABNORMAL HIGH (ref 0.0–100.0)

## 2022-04-23 MED ORDER — TORSEMIDE 20 MG PO TABS: 40.0000 mg | ORAL_TABLET | Freq: Every day | ORAL | 3 refills | Status: DC

## 2022-04-23 MED ORDER — METOPROLOL SUCCINATE ER 25 MG PO TB24: 25.0000 mg | ORAL_TABLET | Freq: Every day | ORAL | 3 refills | Status: DC

## 2022-04-23 NOTE — Progress Notes (Signed)
   04/23/22 1055  ReDS Vest / Clip  Station Marker D  Ruler Value 34  ReDS Value Range (!) > 40  ReDS Actual Value 45

## 2022-04-23 NOTE — Progress Notes (Signed)
Height: 5'11    Weight:256lb BMI:35  Today's Date: 04/23/2022  STOP BANG RISK ASSESSMENT S (snore) Have you been told that you snore?     YES   T (tired) Are you often tired, fatigued, or sleepy during the day?   YES  O (obstruction) Do you stop breathing, choke, or gasp during sleep? YES   P (pressure) Do you have or are you being treated for high blood pressure? YES   B (BMI) Is your body index greater than 35 kg/m? YES   A (age) Are you 52 years old or older? YES   N (neck) Do you have a neck circumference greater than 16 inches?   NO   G (gender) Are you a male? YES   TOTAL STOP/BANG "YES" ANSWERS 7                                                                       For Office Use Only              Procedure Order Form    YES to 3+ Stop Bang questions OR two clinical symptoms - patient qualifies for WatchPAT (CPT 95800)      Clinical Notes: Will consult Sleep Specialist and refer for management of therapy due to patient increased risk of Sleep Apnea. Ordering a sleep study due to the following two clinical symptoms: Excessive daytime sleepiness G47.10 /Difficulty concentrating R41.840 / Loud snoring R06.83  / Unrefreshed by sleep G47.8 /History of high blood pressure R03.0

## 2022-04-23 NOTE — Progress Notes (Addendum)
Jesse [name] home sleep study given to patient, all instructions explained, waiver signed, and CLOUDPAT registration complete.   Zio patch placed onto patient.  All instructions and information reviewed with patient, they verbalize understanding with no questions.

## 2022-04-23 NOTE — Patient Instructions (Addendum)
Medication Changes:  Stop taking Lasix.  Start taking Torsemide 40 mg (2 tablets) daily.   Start taking Toprolol 25 mg (1 tablet) daily.  Lab Work:  You will have lab work today. Labs done today, your results will be available in MyChart, we will contact you for abnormal readings.   Testing/Procedures:  Your physician has recommended that you have a sleep study. This test records several body functions during sleep, including: brain activity, eye movement, oxygen and carbon dioxide blood levels, heart rate and rhythm, breathing rate and rhythm, the flow of air through your mouth and nose, snoring, body muscle movements, and chest and belly movement.  Your provider has recommended that you have a home sleep study (Itamar Test).  We have provided you with the equipment in our office today. Please go ahead and download the app. DO NOT OPEN OR TAMPER WITH THE BOX UNTIL WE ADVISE YOU TO DO SO. Once insurance has approved the test our office will call you with PIN number and approval to proceed with testing. Once you have completed the test you just dispose of the equipment, the information is automatically uploaded to [place] via blue-tooth technology. If your test is positive for sleep apnea and you need a home CPAP machine you will be contacted by Dr Theodosia Blender office [facility]) to set this up.    Your provider has recommended that  you wear a Zio Patch for 7 days.  This monitor will record your heart rhythm for our review.  IF you have any symptoms while wearing the monitor please press the button.  If you have any issues with the patch or you notice a red or orange light on it please call the company at [phone].  Once you remove the patch please mail it back to the company as soon as possible so we can get the results.    Special Instructions // Education:  Do the following things EVERYDAY: Weigh yourself in the morning before breakfast. Write it down and keep it in a log. Take your  medicines as prescribed Eat low salt foods--Limit salt (sodium) to 2000 mg per day.  Stay as active as you can everyday Limit all fluids for the day to less than 2 liters   Follow-Up in: you have a follow up appointment with Darylene Price in 6 weeks.     If you have any questions or concerns before your next appointment please send [place] a message through [place] or call our office at [phone] Monday-Friday 8 am-5 pm.   If you have an urgent need after hours on the weekend please call your Primary Cardiologist or the [facility] in [place] at [phone].

## 2022-04-25 ENCOUNTER — Encounter: Payer: Self-pay | Admitting: Internal Medicine

## 2022-04-25 ENCOUNTER — Telehealth: Payer: Self-pay

## 2022-04-25 NOTE — Telephone Encounter (Signed)
  Spoke with pt over the phone regarding his concerns of sodium, chloride and BNP levels. Advised given to pt Per Darylene Price, FNP, Sodium level is pretty stable and chloride level is just below normal so no need to be concerned about these levels. Your potassium level is normal so you can try over the counter magnesium and see if that helps with your leg crams. If it doesn't, please follow up with your PCP regarding this.  BNP level is still elevated but improve form previous reading. Hopefully with the new fluid pill this number will continue to improve.  Pt aware, agreeable, and verbalized understanding.

## 2022-04-25 NOTE — Telephone Encounter (Signed)
Itamar home sleep study authorized. Pt contacted and advised to proceed with test. Passcode given. Reviewed steps with pt, with teach back. Pt aware, agreeable, and verbalized understanding

## 2022-04-26 ENCOUNTER — Encounter (INDEPENDENT_AMBULATORY_CARE_PROVIDER_SITE_OTHER): Payer: 59 | Admitting: Cardiology

## 2022-04-26 DIAGNOSIS — G4733 Obstructive sleep apnea (adult) (pediatric): Secondary | ICD-10-CM

## 2022-04-28 ENCOUNTER — Ambulatory Visit: Payer: 59 | Attending: Internal Medicine

## 2022-04-28 DIAGNOSIS — I5022 Chronic systolic (congestive) heart failure: Secondary | ICD-10-CM

## 2022-04-28 NOTE — Procedures (Addendum)
SLEEP STUDY REPORT Patient Information Study Date: 04/26/2022 Patient Name: Jesse [name] Patient ID: [id number] Birth Date:  Age:  Gender: Male BMI: 35.8 (W=256 lb, H=5' 11'') Stopbang: 7 Referring Physician: Arvilla Meres, MD  TEST DESCRIPTION: Home sleep apnea testing was completed using the WatchPat, a Type 1 device, utilizing peripheral arterial tonometry (PAT), chest movement, actigraphy, pulse oximetry, pulse rate, body position and snore.  AHI was calculated with apnea and hypopnea using valid sleep time as the denominator. RDI includes apneas, hypopneas, and RERAs.  The data acquired and the scoring of sleep and all associated events were performed in accordance with the recommended standards and specifications as outlined in the AASM Manual for the Scoring of Sleep and Associated Events 2.2.0 (2015).  FINDINGS:  1.  Severe Obstructive Sleep Apnea with AHI 74.7/hr.   2.  Moderate Central Sleep Apnea with pAHIc 23/hr.  43.15 of events were [employer].  3.  Oxygen desaturations as low as 75%.  4.  Moderate to severe snoring was present. O2 sats were < 88% for  85.5 min.  5.  Total sleep time was 5 hrs and 8 min.  6.  25.5% of total sleep time was spent in REM sleep.   7.  Shortened sleep onset latency at 7 min.   8.  Prolonged REM sleep onset latency at 224 min.   9.  Total awakenings were 11.  10. Arrhythmia detection:  None.  DIAGNOSIS:   Severe Obstructive Sleep Apnea (G47.33) Moderate Sleep Apnea Nocturnal Hypoxemia  RECOMMENDATIONS:   1.  Clinical correlation of these findings is necessary.  The decision to treat obstructive sleep apnea (OSA) is usually based on the presence of apnea symptoms or the presence of associated medical conditions such as Hypertension, Congestive Heart Failure, Atrial Fibrillation or Obesity.  The most common symptoms of OSA are snoring, gasping for breath while sleeping, daytime sleepiness and fatigue.   2.   Initiating apnea therapy is recommended given the presence of symptoms and/or associated conditions. Recommend proceeding with one of the following:     a.  Auto-CPAP therapy with a pressure range of 5-20cm H2O.  Patient may require BiPAP S/T given moderate complex sleep apnea.       b.  An oral appliance (OA) that can be obtained from certain dentists with expertise in sleep medicine.  These are primarily of use in non-obese patients with mild and moderate disease.     c.  An ENT consultation which may be useful to look for specific causes of obstruction and possible treatment options.     d.  If patient is intolerant to PAP therapy, consider referral to ENT for evaluation for hypoglossal nerve stimulator.   3.  Close follow-up is necessary to ensure success with CPAP or oral appliance therapy for maximum benefit.  4.  A follow-up oximetry study on CPAP is recommended to assess the adequacy of therapy and determine the need for supplemental oxygen or the potential need for Bi-level therapy.  An arterial blood gas to determine the adequacy of baseline ventilation and oxygenation should also be considered.  5.  Healthy sleep recommendations include:  adequate nightly sleep (normal 7-9 hrs/night), avoidance of caffeine after noon and alcohol near bedtime, and maintaining a sleep environment that is cool, dark and quiet.  6.  Weight loss for overweight patients is recommended.  Even modest amounts of weight loss can significantly improve the severity of sleep apnea.  7.  Snoring recommendations include:  weight loss where appropriate, side sleeping, and avoidance of alcohol before bed.  8.  Operation of motor vehicle should be avoided when sleepy.  Signature: Armanda Magic, MD; [facility]; Diplomat, American Board of Sleep Medicine Electronically Signed: 04/28/2022 6:48:34 PM

## 2022-05-07 ENCOUNTER — Telehealth: Payer: Self-pay

## 2022-05-07 NOTE — Telephone Encounter (Signed)
Pt called to inquire about his home sleep study results. Informed patient that Dr. Mayford Knife with [facility] in [place] reads the sleep studies and their office will contact him with to set up an appointment and review results and treatment options, if he needs treatment for sleep apnea.  Pt verbalized understanding and had no further questions at this time.

## 2022-05-08 ENCOUNTER — Telehealth (HOSPITAL_COMMUNITY): Payer: Self-pay | Admitting: *Deleted

## 2022-05-08 NOTE — Telephone Encounter (Signed)
Pt called to f/u with sleep study results, advised of +result, Dr Norris Cross office will be in contact with pt to arrange/set-up cpap, message sent to them to follow-up

## 2022-05-09 ENCOUNTER — Telehealth: Payer: Self-pay | Admitting: *Deleted

## 2022-05-09 DIAGNOSIS — G4733 Obstructive sleep apnea (adult) (pediatric): Secondary | ICD-10-CM

## 2022-05-09 DIAGNOSIS — R0683 Snoring: Secondary | ICD-10-CM

## 2022-05-09 DIAGNOSIS — I10 Essential (primary) hypertension: Secondary | ICD-10-CM

## 2022-05-09 DIAGNOSIS — I5022 Chronic systolic (congestive) heart failure: Secondary | ICD-10-CM

## 2022-05-09 NOTE — Telephone Encounter (Signed)
The patient has been notified of the result. Left detailed message on voicemail and informed patient to call back..Voyd Groft Green, CMA   

## 2022-05-09 NOTE — Telephone Encounter (Signed)
-----   Message from Gaynelle Cage, CMA sent at 04/29/2022  8:18 AM EDT -----  ----- Message ----- From: Quintella Reichert, MD Sent: 04/28/2022   6:50 PM EDT To: Cv Div Sleep Studies  Please let patient know that they have sleep apnea.  Recommend therapeutic CPAP titration for treatment of patient's sleep disordered breathing.  If unable to perform an in lab titration then initiate ResMed auto CPAP from 4 to 15cm H2O with heated humidity and mask of choice and overnight pulse ox on CPAP.

## 2022-05-13 ENCOUNTER — Telehealth: Payer: Self-pay

## 2022-05-13 NOTE — Telephone Encounter (Addendum)
Zio patch placed 04/23/22: 05/10/22: zio-called and said they received the zio patch but unfortunately was damaged in the mail and were unable to extract any data. The Zio patch was resent to pt.  4/22: Pt aware, agreeable, and verbalized understanding.

## 2022-05-19 DIAGNOSIS — I493 Ventricular premature depolarization: Secondary | ICD-10-CM

## 2022-05-21 LAB — LAB REPORT - SCANNED: EGFR: 116

## 2022-05-29 ENCOUNTER — Other Ambulatory Visit: Payer: Self-pay

## 2022-05-29 ENCOUNTER — Telehealth: Payer: Self-pay

## 2022-05-29 DIAGNOSIS — Z1211 Encounter for screening for malignant neoplasm of colon: Secondary | ICD-10-CM

## 2022-05-29 MED ORDER — NA SULFATE-K SULFATE-MG SULF 17.5-3.13-1.6 GM/177ML PO SOLN: 1.0000 | Freq: Once | ORAL | 0 refills | Status: AC

## 2022-05-29 NOTE — Telephone Encounter (Signed)
The patient has been notified of the result and verbalized understanding.  All questions (if any) were answered.     

## 2022-05-29 NOTE — Addendum Note (Signed)
Addended by: Reesa Chew on: 05/29/2022 02:41 PM   Modules accepted: Orders

## 2022-05-29 NOTE — Telephone Encounter (Signed)
Gastroenterology Pre-Procedure Review  Request Date: 06/28/22 Requesting Physician: Dr. Allegra Lai  PATIENT REVIEW QUESTIONS: The patient responded to the following health history questions as indicated:    1. Are you having any GI issues? no 2. Do you have a personal history of Polyps? no 3. Do you have a family history of Colon Cancer or Polyps?no 4. Diabetes Mellitus? yes (Stop Trulicity 7 days prior to colonoscopy on 05/31.  Stop Jardiance 3 days prior to colonoscopy on 06/04.  Stop Tradjenta 2 days prior to colonoscopy on 06/05) 5. Joint replacements in the past 12 months?no 6. Major health problems in the past 3 months?no 7. Any artificial heart valves, MVP, or defibrillator?no Patient does have cardiac health issues clearance to be sent to Dr. Juliann Pares    MEDICATIONS & ALLERGIES:    Patient reports the following regarding taking any anticoagulation/antiplatelet therapy:   Plavix, Coumadin, Eliquis, Xarelto, Lovenox, Pradaxa, Brilinta, or Effient? no Aspirin? yes (81 mg)  Patient confirms/reports the following medications:  Current Outpatient Medications  Medication Sig Dispense Refill   albuterol (VENTOLIN HFA) 108 (90 Base) MCG/ACT inhaler Inhale 2-4 puffs by mouth every 4 hours as needed for wheezing, cough, and/or shortness of breath 6.7 g 0   AMBULATORY NON FORMULARY MEDICATION Trimix (30/1/10)-(Pap/Phent/PGE)  Test Dose  1ml vial   Qty #3 Refills 0  [facility] [phone] Fax [phone] 3 mL 0   aspirin 81 MG EC tablet Take 1 tablet (81 mg total) by mouth daily. 30 tablet 12   atorvastatin (LIPITOR) 40 MG tablet Take 80 mg by mouth daily.      blood glucose meter kit and supplies KIT Dispense based on patient and insurance preference. Use up to four times daily as directed. (FOR ICD-9 250.00, 250.01). 1 each 0   Budeson-Glycopyrrol-Formoterol (BREZTRI AEROSPHERE) 160-9-4.8 MCG/ACT AERO Inhale 2 puffs into the lungs daily.     empagliflozin (JARDIANCE) 25 MG  TABS tablet Take by mouth daily.     lisinopril (PRINIVIL,ZESTRIL) 10 MG tablet Take 1 tablet (10 mg total) by mouth daily. 30 tablet 2   metoprolol succinate (TOPROL XL) 25 MG 24 hr tablet Take 1 tablet (25 mg total) by mouth daily. 90 tablet 3   nitroGLYCERIN (NITROSTAT) 0.4 MG SL tablet Place 1 tablet (0.4 mg total) under the tongue every 5 (five) minutes as needed for chest pain. (Patient not taking: Reported on 03/21/2022) 30 tablet 0   spironolactone (ALDACTONE) 25 MG tablet Take 1 tablet (25 mg total) by mouth daily. 30 tablet 5   tadalafil (CIALIS) 20 MG tablet 1 tablet 1 hour prior to intercourse (Patient not taking: Reported on 03/21/2022) 5 tablet 0   torsemide (DEMADEX) 20 MG tablet Take 2 tablets (40 mg total) by mouth daily. 90 tablet 3   TRADJENTA 5 MG TABS tablet Take 5 mg by mouth daily.     traMADol (ULTRAM) 50 MG tablet Take 50 mg by mouth every 6 (six) hours as needed for pain.     No current facility-administered medications for this visit.    Patient confirms/reports the following allergies:  No Known Allergies  No orders of the defined types were placed in this encounter.   AUTHORIZATION INFORMATION Primary Insurance: 1D#: Group #:  Secondary Insurance: 1D#: Group #:  SCHEDULE INFORMATION: Date:  Time: Location:

## 2022-05-29 NOTE — Telephone Encounter (Signed)
The patient has been notified of the result and verbalized understanding.  All questions (if any) were answered. Latrelle Dodrill, CMA 05/29/2022 2:31 PM    Will precert titration

## 2022-05-29 NOTE — Telephone Encounter (Signed)
Patient is leaving messages with our office about his sleep study results and machine. Can you follow up with him?  Thanks!

## 2022-05-30 ENCOUNTER — Emergency Department: Payer: 59

## 2022-05-30 ENCOUNTER — Other Ambulatory Visit: Payer: Self-pay

## 2022-05-30 ENCOUNTER — Emergency Department
Admission: EM | Admit: 2022-05-30 | Discharge: 2022-05-30 | Disposition: A | Discharge status: Home / Self Care | Payer: 59 | Attending: Emergency Medicine | Admitting: Emergency Medicine

## 2022-05-30 ENCOUNTER — Encounter: Payer: Self-pay | Admitting: Intensive Care

## 2022-05-30 DIAGNOSIS — S20462A Insect bite (nonvenomous) of left back wall of thorax, initial encounter: Secondary | ICD-10-CM | POA: Insufficient documentation

## 2022-05-30 DIAGNOSIS — E119 Type 2 diabetes mellitus without complications: Secondary | ICD-10-CM | POA: Diagnosis not present

## 2022-05-30 DIAGNOSIS — I10 Essential (primary) hypertension: Secondary | ICD-10-CM | POA: Diagnosis not present

## 2022-05-30 DIAGNOSIS — W57XXXA Bitten or stung by nonvenomous insect and other nonvenomous arthropods, initial encounter: Secondary | ICD-10-CM | POA: Insufficient documentation

## 2022-05-30 DIAGNOSIS — E876 Hypokalemia: Secondary | ICD-10-CM

## 2022-05-30 DIAGNOSIS — R531 Weakness: Secondary | ICD-10-CM | POA: Diagnosis not present

## 2022-05-30 DIAGNOSIS — R059 Cough, unspecified: Secondary | ICD-10-CM | POA: Insufficient documentation

## 2022-05-30 DIAGNOSIS — Z20822 Contact with and (suspected) exposure to covid-19: Secondary | ICD-10-CM | POA: Diagnosis not present

## 2022-05-30 LAB — CBC WITH DIFFERENTIAL/PLATELET
Abs Immature Granulocytes: 0.12 10*3/uL — ABNORMAL HIGH (ref 0.00–0.07)
Basophils Absolute: 0.1 10*3/uL (ref 0.0–0.1)
Basophils Relative: 1 %
Eosinophils Absolute: 0.3 10*3/uL (ref 0.0–0.5)
Eosinophils Relative: 2 %
HCT: 55.6 % — ABNORMAL HIGH (ref 39.0–52.0)
Hemoglobin: 18.1 g/dL — ABNORMAL HIGH (ref 13.0–17.0)
Immature Granulocytes: 1 %
Lymphocytes Relative: 19 %
Lymphs Abs: 3.1 10*3/uL (ref 0.7–4.0)
MCH: 25.7 pg — ABNORMAL LOW (ref 26.0–34.0)
MCHC: 32.6 g/dL (ref 30.0–36.0)
MCV: 79.1 fL — ABNORMAL LOW (ref 80.0–100.0)
Monocytes Absolute: 1.3 10*3/uL — ABNORMAL HIGH (ref 0.1–1.0)
Monocytes Relative: 8 %
Neutro Abs: 11.7 10*3/uL — ABNORMAL HIGH (ref 1.7–7.7)
Neutrophils Relative %: 69 %
Platelets: 341 10*3/uL (ref 150–400)
RBC: 7.03 MIL/uL — ABNORMAL HIGH (ref 4.22–5.81)
RDW: 17.2 % — ABNORMAL HIGH (ref 11.5–15.5)
WBC: 16.5 10*3/uL — ABNORMAL HIGH (ref 4.0–10.5)
nRBC: 0 % (ref 0.0–0.2)

## 2022-05-30 LAB — COMPREHENSIVE METABOLIC PANEL
ALT: 16 U/L (ref 0–44)
AST: 16 U/L (ref 15–41)
Albumin: 2.1 g/dL — ABNORMAL LOW (ref 3.5–5.0)
Alkaline Phosphatase: 54 U/L (ref 38–126)
Anion gap: 12 (ref 5–15)
BUN: 15 mg/dL (ref 6–20)
CO2: 17 mmol/L — ABNORMAL LOW (ref 22–32)
Calcium: 5.9 mg/dL — CL (ref 8.9–10.3)
Chloride: 108 mmol/L (ref 98–111)
Creatinine, Ser: 0.52 mg/dL — ABNORMAL LOW (ref 0.61–1.24)
GFR, Estimated: >60 mL/min (ref >60)
Glucose, Bld: 225 mg/dL — ABNORMAL HIGH (ref 70–99)
Potassium: 2.8 mmol/L — ABNORMAL LOW (ref 3.5–5.1)
Sodium: 137 mmol/L (ref 135–145)
Total Bilirubin: 0.6 mg/dL (ref 0.3–1.2)
Total Protein: 4.4 g/dL — ABNORMAL LOW (ref 6.5–8.1)

## 2022-05-30 LAB — TROPONIN I (HIGH SENSITIVITY)
Troponin I (High Sensitivity): 51 ng/L — ABNORMAL HIGH (ref <18)
Troponin I (High Sensitivity): 38 ng/L — ABNORMAL HIGH (ref <18)

## 2022-05-30 LAB — SARS CORONAVIRUS 2 BY RT PCR: SARS Coronavirus 2 by RT PCR: NEGATIVE

## 2022-05-30 MED ORDER — SODIUM CHLORIDE 0.9 % IV BOLUS: 1000.0000 mL | Freq: Once | INTRAVENOUS | Status: DC

## 2022-05-30 MED ORDER — POTASSIUM CHLORIDE 10 MEQ/100ML IV SOLN
10.0000 meq | Freq: Once | INTRAVENOUS | Status: AC
  Administered 2022-05-30: 10 meq via INTRAVENOUS
  Filled 2022-05-30: qty 100

## 2022-05-30 MED ORDER — SODIUM CHLORIDE 0.9 % IV BOLUS
500.0000 mL | Freq: Once | INTRAVENOUS | Status: AC
  Administered 2022-05-30: 500 mL via INTRAVENOUS

## 2022-05-30 MED ORDER — DOXYCYCLINE HYCLATE 100 MG PO CAPS: 100.0000 mg | ORAL_CAPSULE | Freq: Two times a day (BID) | ORAL | 0 refills | Status: DC

## 2022-05-30 MED ORDER — CALCIUM GLUCONATE-NACL 2-0.675 GM/100ML-% IV SOLN
2.0000 g | Freq: Once | INTRAVENOUS | Status: AC
  Administered 2022-05-30: 2000 mg via INTRAVENOUS
  Filled 2022-05-30: qty 100

## 2022-05-30 MED ORDER — SODIUM CHLORIDE 0.9 % IV SOLN: 2.0000 g | Freq: Once | INTRAVENOUS | Status: DC

## 2022-05-30 MED ORDER — POTASSIUM CHLORIDE CRYS ER 20 MEQ PO TBCR
40.0000 meq | EXTENDED_RELEASE_TABLET | Freq: Once | ORAL | Status: AC
  Administered 2022-05-30: 40 meq via ORAL
  Filled 2022-05-30: qty 2

## 2022-05-30 NOTE — Discharge Instructions (Addendum)
Take doxycycline as prescribed.  Return emergency department worsening.  See regular doctor if not improving 3 to 4 days.

## 2022-05-30 NOTE — ED Triage Notes (Signed)
C/o body aches, congestion, cough, and feeling weak. Also c/o upper back/between shoulder blade pain. Reports he may have been exposed to covid.

## 2022-05-30 NOTE — ED Provider Notes (Signed)
[facility] Provider Note    Event Date/Time   First MD Initiated Contact with Patient 05/30/22 1413     (approximate)   History   Cough, Back Pain, and Nasal Congestion   HPI  Jesse [name] is a 52 y.o. male   presents to the ED with complaint of bodyaches, congestion, cough and feeling weak.  Patient also reports that he may have been exposed to COVID.  He reports that his wife saw a picture of his bosses wife and her positive COVID test on Facebook and he is extremely concerned that he may have been exposed.  He denies any nausea, vomiting, fever, chills, shortness of breath.  He also has a tick bite to the left lateral torso prior to his symptoms.  Patient denies any headache.  He states he has had problems with tick bites in the past.  Patient has a history of hypertension, dyspnea, STEMI, polysubstance abuse and diabetes.      Physical Exam   Triage Vital Signs: ED Triage Vitals [05/30/22 1359]  Enc Vitals Group     BP 96/66     Pulse Rate 60     Resp 18     Temp 98.5 F (36.9 C)     Temp Source Oral     SpO2 95 %     Weight 245 lb (111.1 kg)     Height 5\' 11"  (1.803 m)     Head Circumference      Peak Flow      Pain Score 0     Pain Loc      Pain Edu?      Excl. in GC?     Most recent vital signs: Vitals:   05/30/22 1359  BP: 96/66  Pulse: 60  Resp: 18  Temp: 98.5 F (36.9 C)  SpO2: 95%     General: Awake, no distress.  CV:  Good peripheral perfusion.  Heart regular rate and rhythm. Resp:  Normal effort.  Lungs are clear bilaterally. Abd:  No distention.  Other:  Left lateral torso there is an erythematous area consistent with a tick bite with no portion of the tick remaining.   ED Results / Procedures / Treatments   Labs (all labs ordered are listed, but only abnormal results are displayed) Labs Reviewed  TROPONIN I (HIGH SENSITIVITY) - Abnormal; Notable for the following components:      Result Value   Troponin  I (High Sensitivity) 51 (*)    All other components within normal limits  SARS CORONAVIRUS 2 BY RT PCR  CBC WITH DIFFERENTIAL/PLATELET  COMPREHENSIVE METABOLIC PANEL  TROPONIN I (HIGH SENSITIVITY)  TROPONIN I (HIGH SENSITIVITY)     EKG  Vent. rate 98 BPM PR interval 184 ms QRS duration 88 ms QT/QTcB 362/462 ms P-R-T axes 85 120 -47 Sinus rhythm with frequent Premature ventricular complexes Septal infarct (cited on or before 21-Mar-2022) Lateral infarct (cited on or before 14-Apr-2022) T wave abnormality, consider inferior ischemia Abnormal ECG When compared with ECG of 23-Apr-2022 10:26, No significant change was found   RADIOLOGY Chest x-ray images reviewed and interpreted by myself independent of the radiologist and was negative for infiltrate or acute changes.    PROCEDURES:  Critical Care performed:   Procedures   MEDICATIONS ORDERED IN ED: Medications - No data to display   IMPRESSION / MDM / ASSESSMENT AND PLAN / ED COURSE  I reviewed the triage vital signs and the nursing notes.  Differential diagnosis includes, but is not limited to, COVID, viral illness, tickborne illness, pneumonia, bronchitis, upper respiratory infection, seasonal allergies, elevated troponin since 1834.  52 year old male presents to the ED with concerns of possibly being exposed to COVID.  He states that his wife saw a picture on Facebook of his bosses wife's test.  Patient also at the same time has removed a tick from his body and thinks it could have been the same time as he began having some body aches and has also had some muscle discomfort between his shoulder blades.  He denies any chest pain, shortness of breath, difficulty breathing.  Initial troponin was elevated at 51, COVID test is negative, chest x-ray is negative for acute cardiopulmonary changes.  Will get a second troponin and of note his troponins have been elevated since 2023.  At this time remaining lab work is still  pending.  Doxycycline was sent to patient's pharmacy for him to begin taking.  ----------------------------------------- 3:49 PM on 05/30/2022 ----------------------------------------- Lab work is pending on this patient.  I have discussed patient's symptoms and workup with Greig Right, PA-C who will be taking over the care of this patient.     Patient's presentation is most consistent with acute illness / injury with system symptoms.  FINAL CLINICAL IMPRESSION(S) / ED DIAGNOSES   Final diagnoses:  Tick bite of back, initial encounter     Rx / DC Orders   ED Discharge Orders          Ordered    doxycycline (VIBRAMYCIN) 100 MG capsule  2 times daily        05/30/22 1543             Note:  This document was prepared using Dragon voice recognition software and may include unintentional dictation errors.   Tommi Rumps, PA-C 05/31/22 1219    Trinna Post, MD 05/31/22 Ebony Cargo

## 2022-05-30 NOTE — ED Notes (Signed)
See triage note  Presents with some chest congestion and cough    Sx started a few days ago Afebrile on arrival

## 2022-05-30 NOTE — ED Provider Notes (Signed)
See original H&P by Bridget Hartshorn, PA-C   Physical Exam  BP 96/66 (BP Location: Left Arm)   Pulse 60   Temp 98.5 F (36.9 C) (Oral)   Resp 18   Ht 5\' 11"  (1.803 m)   Wt 111.1 kg   SpO2 95%   BMI 34.17 kg/m   Physical Exam patient appears to be feeling well  Procedures  Procedures  ED Course / MDM    Medical Decision Making Amount and/or Complexity of Data Reviewed Labs: ordered. Radiology: ordered.  Risk Prescription drug management.   Care transferred to me by Bridget Hartshorn, PA-C.  Plan at this time is to wait and evaluate covid lab.  Patient has a history of elevated troponins.  Troponin today is 51 will repeat troponin due to the patient feeling weak.  If normal plan is to discharge with prescription for doxycycline due to multiple tick bites  Patient's labs are concerning for hypokalemia and hypocalcemia, will give the patient a 1 L normal saline as he does appear to be dehydrated, 10 mEq of potassium IV, 2 g of calcium IV, and 40 mEq of potassium p.o.  Patient's potassium is 2.8, calcium even after corrected with a low albumin is still low at 7.4, initial reading here is 5.9, troponin remained stable  I did offer the patient admission, he would prefer to go home.  I feel that it would be appropriate since we are giving him medication here and he can follow-up with his doctor for him to go home after the treatment.  However he was given strict instructions that if he is worsening he will need to return.  He is to pick up the doxycycline for the tick bites at the pharmacy.  Patient is in agreement treatment plan.      Faythe Ghee, PA-C 05/30/22 1911    Trinna Post, MD 05/30/22 [phone]

## 2022-06-04 ENCOUNTER — Telehealth: Payer: Self-pay

## 2022-06-04 NOTE — Telephone Encounter (Signed)
Cardiac clearance received from Dr. Juliann Pares 06/03/22.  Patient is cleared to have procedure.  Colonoscopy 06/28/22 with Dr. Allegra Lai at [facility].  Thanks, [place], [place]

## 2022-06-06 ENCOUNTER — Encounter: Payer: Self-pay | Admitting: Family

## 2022-06-06 ENCOUNTER — Other Ambulatory Visit (HOSPITAL_COMMUNITY): Payer: Self-pay

## 2022-06-06 ENCOUNTER — Ambulatory Visit: Payer: 59 | Attending: Family | Admitting: Family

## 2022-06-06 VITALS — BP 119/76 | HR 89 | Wt 241.4 lb

## 2022-06-06 DIAGNOSIS — E1165 Type 2 diabetes mellitus with hyperglycemia: Secondary | ICD-10-CM | POA: Diagnosis not present

## 2022-06-06 DIAGNOSIS — F17290 Nicotine dependence, other tobacco product, uncomplicated: Secondary | ICD-10-CM | POA: Diagnosis not present

## 2022-06-06 DIAGNOSIS — I2510 Atherosclerotic heart disease of native coronary artery without angina pectoris: Secondary | ICD-10-CM

## 2022-06-06 DIAGNOSIS — I471 Supraventricular tachycardia, unspecified: Secondary | ICD-10-CM | POA: Insufficient documentation

## 2022-06-06 DIAGNOSIS — I110 Hypertensive heart disease with heart failure: Secondary | ICD-10-CM | POA: Insufficient documentation

## 2022-06-06 DIAGNOSIS — I252 Old myocardial infarction: Secondary | ICD-10-CM | POA: Diagnosis not present

## 2022-06-06 DIAGNOSIS — G4733 Obstructive sleep apnea (adult) (pediatric): Secondary | ICD-10-CM

## 2022-06-06 DIAGNOSIS — Z955 Presence of coronary angioplasty implant and graft: Secondary | ICD-10-CM | POA: Insufficient documentation

## 2022-06-06 DIAGNOSIS — E119 Type 2 diabetes mellitus without complications: Secondary | ICD-10-CM | POA: Insufficient documentation

## 2022-06-06 DIAGNOSIS — I472 Ventricular tachycardia, unspecified: Secondary | ICD-10-CM | POA: Diagnosis not present

## 2022-06-06 DIAGNOSIS — E785 Hyperlipidemia, unspecified: Secondary | ICD-10-CM | POA: Diagnosis not present

## 2022-06-06 DIAGNOSIS — Z79899 Other long term (current) drug therapy: Secondary | ICD-10-CM | POA: Diagnosis not present

## 2022-06-06 DIAGNOSIS — I493 Ventricular premature depolarization: Secondary | ICD-10-CM

## 2022-06-06 DIAGNOSIS — I5022 Chronic systolic (congestive) heart failure: Secondary | ICD-10-CM

## 2022-06-06 DIAGNOSIS — Z720 Tobacco use: Secondary | ICD-10-CM

## 2022-06-06 NOTE — Progress Notes (Signed)
[facility] HEART FAILURE CLINIC - Pharmacist Note  Jesse [name] is a 52 y.o. male with HFrEF (EF <40%) presenting to the Heart Failure Clinic for follow up. Jesse [name] reports no issues with medication access and no adverse events on current regimen. He reports that he stays active with his job. He reports drinking ~1/2 gallon of tea daily and that his diuretic helps him keep the fluid off. He reports that his appetite is good and that he has not been on dulaglutide in at least 6 months. He plans to resume dulaglutide soon. We discussed the possibility of appetite changes and GI side effects. He does not weigh himself regularly, but he states that he has not had any issues with leg swelling or shortness of breath lately.   Recent ED Visit (past 6 months):  Date: 05/30/2022, CC: cough, back pain, and nasal congestion  Date: 04/14/2022, CC: SOB Date: 03/19/2022, CC: SOB Date:01/14/2022, CC: SOB  Guideline-Directed Medical Therapy/Evidence Based Medicine ACE/ARB/ARNI: Lisinopril 10 mg daily Beta Blocker: Metoprolol succinate 25 mg daily Aldosterone Antagonist: Spironolactone 25 mg daily Diuretic: Torsemide 40 mg daily SGLT2i: Empagliflozin 25 mg daily  Adherence Assessment Do you ever forget to take your medication? [] Yes [x] No  Do you ever skip doses due to side effects? [] Yes [x] No  Do you have trouble affording your medicines? [] Yes [x] No  Are you ever unable to pick up your medication due to transportation difficulties? [] Yes [x] No  Do you ever stop taking your medications because you don't believe they are helping? [] Yes [x] No  Do you check your weight daily? [] Yes [x] No  Adherence strategy: pill box Barriers to obtaining medications: none reported  Diagnostics ECHO: Date 04/17/2022, EF 35-40%, GHK  Vitals    06/06/2022   10:35 AM 05/30/2022    7:56 PM 05/30/2022    1:59 PM  Vitals with BMI  Height   5\' 11"   Weight 241 lbs 6 oz  245 lbs  BMI 33.68  34.19  Systolic 119 110 96   Diastolic 76 70 66  Pulse 89 65 60     Recent Labs    Latest Ref Rng & Units 05/30/2022    5:23 PM 04/23/2022   11:51 AM 04/14/2022    1:36 PM  BMP  Glucose 70 - 99 mg/dL 098  119  147   BUN 6 - 20 mg/dL 15  14  14    Creatinine 0.61 - 1.24 mg/dL 8.29  5.62  1.30   Sodium 135 - 145 mmol/L 137  132  131   Potassium 3.5 - 5.1 mmol/L 2.8  4.3  3.7   Chloride 98 - 111 mmol/L 108  96  100   CO2 22 - 32 mmol/L 17  28  26    Calcium 8.9 - 10.3 mg/dL 5.9  8.9  8.2     Past Medical History Past Medical History:  Diagnosis Date   CHF (congestive heart failure) (HCC)    Coronary artery disease    Inferior myocardial infarction in 2004.  He was treated with thrombolytics at [facility].  He then had an inferior MI in March 2007.  He received a Horizon study stent 3.5 x 20 mm in the distal RCA at [facility].  No other obstructive disease.   Diabetes mellitus    TYPE II   Erectile dysfunction    Hyperlipidemia    Hypertension    MI (myocardial infarction) (HCC) 8657,8469   Morbid obesity (HCC)    Sleep apnea  Plan Continue regimen as directed by NP  Reduce lisinopril to 5 mg daily given recent hypotension Consider switch from lisinopril to Entresto 24/26 mg twice daily if BP allows (copay $0 for 90d) Resume dulaglutide as prescribed by PCP  Time spent: 10 minutes  Celene Squibb, PharmD PGY1 Pharmacy Resident 06/06/2022 11:21 AM

## 2022-06-06 NOTE — Progress Notes (Signed)
PCP: Darreld Mclean, MD (last seen 2 weeks ago) Primary Cardiologist: Dorothyann Peng, MD (last seen 04/24) HF provider: Arvilla Meres, MD (last seen 04/24)  HPI:  Jesse [name] is a 52 y/o male with a history of CAD, DM2, hyperlipidemia, HTN, OSA, previous tobacco use and chronic heart failure.   Reports CAD with NSTEMI in 2004 and 2007.  Had inferior STEMI in 6/18. Cath with LAD 20% LCx 50%d. OM4 75% RCA 100%m  -> PCI with 2 overlapping DES stents   Echo 04/17/22 EF 35-40% moderate LVH RV low normal. Echo 08/15/20: EF of 45-50%   Stress test 07/2020: Abnormal myocardial perfusion scan. Mildly depressed left ventricular function mild inferior hypoejection fraction 45% there is evidence of what appears to be fixed defect in the inferior segment but no evidence of reversibility to segment mid represent scar versus artifact.    Was in the ED 05/30/22 due to a tick bite on his back. Found to be hypokalemic/ hypocalcemic. Given IV potassium, calcium gluconate and small fluid bolus. Was in the ED 04/14/22 due to a/c HF. Ultrasound negative for DVT. Diuresed with improvement of symptoms. Was in the ED 03/19/22 due to SOB and weight gain. D dimer elevated so CTA done but no PE or pneumonia.   He presents today for a HF f/u visit with a chief complaint of minimal SOB w/ moderate exertion. Chronic in nature. He has associated fatigue, cough, back pain and recent tick bite along with this. Says that his back started hurting because he thinks he got up from the floor wrong. Currently being treated for a recent tick bit. During recent EF visit, was found to be hypokalemia/hypocalcemic which he says has been checked by his PCP and that "everything's normal now".    Voice frustration with having tests done but then doesn't feel like he ever hears about the results. Also has concerns regarding all his medications and that his home blood pressure has been running low. Shows me BP readings of 89/67 & 98/69.  Since  seen in HF clinic 04/23/22, he has had his sleep study, worn his zio monitor, started metoprolol succinated and has seen cardiologist.    ROS: All systems negative except as listed in HPI, PMH and Problem List.  SH:  Social History   Socioeconomic History   Marital status: Married    Spouse name: Not on file   Number of children: Not on file   Years of education: Not on file   Highest education level: Not on file  Occupational History   Not on file  Tobacco Use   Smoking status: Every Day    Packs/day: 1.00    Years: 15.00    Additional pack years: 0.00    Total pack years: 15.00    Types: Cigars, Cigarettes    Last attempt to quit: 2020    Years since quitting: 4.3   Smokeless tobacco: Never  Vaping Use   Vaping Use: Never used  Substance and Sexual Activity   Alcohol use: No   Drug use: No    Comment: prior hx of cocaine and marijuana   Sexual activity: Not on file  Other Topics Concern   Not on file  Social History Narrative   Not on file   Social Determinants of Health   Financial Resource Strain: Not on file  Food Insecurity: Not on file  Transportation Needs: Not on file  Physical Activity: Not on file  Stress: Not on file  Social Connections: Not on  file  Intimate Partner Violence: Not on file    FH:  Family History  Adopted: Yes    Past Medical History:  Diagnosis Date   CHF (congestive heart failure) (HCC)    Coronary artery disease    Inferior myocardial infarction in 2004.  He was treated with thrombolytics at [facility].  He then had an inferior MI in March 2007.  He received a Horizon study stent 3.5 x 20 mm in the distal RCA at [facility].  No other obstructive disease.   Diabetes mellitus    TYPE II   Erectile dysfunction    Hyperlipidemia    Hypertension    MI (myocardial infarction) (HCC) [id number]   Morbid obesity (HCC)    Sleep apnea     Current Outpatient Medications  Medication Sig Dispense Refill   albuterol (VENTOLIN HFA) 108 (90  Base) MCG/ACT inhaler Inhale 2-4 puffs by mouth every 4 hours as needed for wheezing, cough, and/or shortness of breath 6.7 g 0   AMBULATORY NON FORMULARY MEDICATION Trimix (30/1/10)-(Pap/Phent/PGE)  Test Dose  1ml vial   Qty #3 Refills 0  [facility] [phone] Fax [phone] 3 mL 0   aspirin 81 MG EC tablet Take 1 tablet (81 mg total) by mouth daily. 30 tablet 12   atorvastatin (LIPITOR) 40 MG tablet Take 80 mg by mouth daily.      blood glucose meter kit and supplies KIT Dispense based on patient and insurance preference. Use up to four times daily as directed. (FOR ICD-9 250.00, 250.01). 1 each 0   Budeson-Glycopyrrol-Formoterol (BREZTRI AEROSPHERE) 160-9-4.8 MCG/ACT AERO Inhale 2 puffs into the lungs daily.     doxycycline (VIBRAMYCIN) 100 MG capsule Take 1 capsule (100 mg total) by mouth 2 (two) times daily. 14 capsule 0   empagliflozin (JARDIANCE) 25 MG TABS tablet Take by mouth daily.     lisinopril (PRINIVIL,ZESTRIL) 10 MG tablet Take 1 tablet (10 mg total) by mouth daily. 30 tablet 2   metoprolol succinate (TOPROL XL) 25 MG 24 hr tablet Take 1 tablet (25 mg total) by mouth daily. 90 tablet 3   nitroGLYCERIN (NITROSTAT) 0.4 MG SL tablet Place 1 tablet (0.4 mg total) under the tongue every 5 (five) minutes as needed for chest pain. 30 tablet 0   spironolactone (ALDACTONE) 25 MG tablet Take 1 tablet (25 mg total) by mouth daily. 30 tablet 5   tadalafil (CIALIS) 20 MG tablet 1 tablet 1 hour prior to intercourse 5 tablet 0   torsemide (DEMADEX) 20 MG tablet Take 2 tablets (40 mg total) by mouth daily. 90 tablet 3   TRADJENTA 5 MG TABS tablet Take 5 mg by mouth daily.     traMADol (ULTRAM) 50 MG tablet Take 50 mg by mouth every 6 (six) hours as needed for pain.     No current facility-administered medications for this visit.   Vitals:   06/06/22 1035  BP: 119/76  Pulse: 89  SpO2: 100%  Weight: 241 lb 6 oz (109.5 kg)   Wt Readings from Last 3 Encounters:  06/06/22  241 lb 6 oz (109.5 kg)  05/30/22 245 lb (111.1 kg)  04/23/22 256 lb (116.1 kg)   Lab Results  Component Value Date   CREATININE 0.52 (L) 05/30/2022   CREATININE 0.71 04/23/2022   CREATININE 0.60 (L) 04/14/2022   PHYSICAL EXAM:  General:  Well appearing. No resp difficulty HEENT: normal Neck: supple. JVP flat. No lymphadenopathy or thryomegaly appreciated. Cor: PMI normal. Regular rate & rhythm.  No rubs, gallops or murmurs. Lungs: clear Abdomen: soft, nontender, nondistended. No hepatosplenomegaly. No bruits or masses.  Extremities: no cyanosis, clubbing, rash, edema Neuro: alert & oriented x3, cranial nerves grossly intact. Moves all 4 extremities w/o difficulty. Affect pleasant.  ECG: not done   ASSESSMENT & PLAN:  1: Chronic systolic HF due to ICM - NYHA class II - euvolemic today - weighing daily; reminded to call for an overnight weight gain of > 2 pounds or a weekly weight gain of > 5 pounds - weight down 15 pounds from last visit here 6 weeks ago - s/p Inferior STEMI in 6/18. Cath with LAD 20% LCx 50%d. OM4 75% RCA 100%m  -> PCI with 2 overlapping DES stents - Echo 04/17/22 EF 35-40% moderate LVH RV low normal - Echo 08/15/20 EF 45-50% - Myoview 07/22: EF45% inferior scar. No ischemia  - Continue jardiance 25mg  daily (DM dose) - Continue  spironolactone 25mg  daily - continue metoprolol 25mg  daily  - Continue lisinopril although decrease dose to 5mg  daily d/t hypotension; transition to entresto in the future if BP allows - home BP has been 89/67 & 98/69  2: PVCs - reviewed zio results w/ patient - zio showed NSR with 5 runs of VT and one run of SVT; rare PVC's/ PAC's - BMP 05/30/22 showed sodium 137, potassium 2.8, creatinine 0.52 & GFR>60, calcium 5.9 - repeat labs 06/04/22 showed potassium 5.0 & calcium 9.5  3. OSA- - high suspicion for OSA - sleep study has been done and patient says that he's still waiting to get equipment  4. DM2 - A1c 7.3 in 3/24 - Continue  Jardiance - saw PCP Marvis Moeller) a couple of weeks ago   5: CAD  - RCA stent placed 06/2016 - saw cardiology Juliann Pares) 04/24 - continue ASA/statin  6. Tobacco use - smoking a pack of cigars per day - encouraged cessation    Return in 1 month, sooner if needed.

## 2022-06-06 NOTE — Patient Instructions (Signed)
Decrease your lisinopril to 1/2 tablet every day (5mg )

## 2022-06-28 ENCOUNTER — Encounter: Admission: RE | Payer: Self-pay | Source: Home / Self Care

## 2022-06-28 ENCOUNTER — Ambulatory Visit: Admission: RE | Admit: 2022-06-28 | Payer: 59 | Source: Home / Self Care | Admitting: Gastroenterology

## 2022-06-28 SURGERY — COLONOSCOPY WITH PROPOFOL
Anesthesia: General

## 2022-07-15 NOTE — Progress Notes (Deleted)
PCP: Darreld Mclean, MD (last seen 2 weeks ago) Primary Cardiologist: Dorothyann Peng, MD (last seen 04/24) HF provider: Arvilla Meres, MD (last seen 04/24)  HPI:  Jesse [name] is a 52 y/o male with a history of CAD, DM2, hyperlipidemia, HTN, OSA, previous tobacco use and chronic heart failure.   Reports CAD with NSTEMI in 2004 and 2007.  Had inferior STEMI in 6/18. Cath with LAD 20% LCx 50%d. OM4 75% RCA 100%m  -> PCI with 2 overlapping DES stents   Echo 04/17/22 EF 35-40% moderate LVH RV low normal. Echo 08/15/20: EF of 45-50%   Stress test 07/2020: Abnormal myocardial perfusion scan. Mildly depressed left ventricular function mild inferior hypoejection fraction 45% there is evidence of what appears to be fixed defect in the inferior segment but no evidence of reversibility to segment mid represent scar versus artifact.    Was in the ED 05/30/22 due to a tick bite on his back. Found to be hypokalemic/ hypocalcemic. Given IV potassium, calcium gluconate and small fluid bolus. Was in the ED 04/14/22 due to a/c HF. Ultrasound negative for DVT. Diuresed with improvement of symptoms. Was in the ED 03/19/22 due to SOB and weight gain. D dimer elevated so CTA done but no PE or pneumonia.   He presents today for a HF f/u visit with a chief complaint of minimal SOB w/ moderate exertion. Chronic in nature. He has associated fatigue, cough, back pain and recent tick bite along with this. Says that his back started hurting because he thinks he got up from the floor wrong. Currently being treated for a recent tick bit. During recent EF visit, was found to be hypokalemia/hypocalcemic which he says has been checked by his PCP and that "everything's normal now".    Voice frustration with having tests done but then doesn't feel like he ever hears about the results. Also has concerns regarding all his medications and that his home blood pressure has been running low. Shows me BP readings of 89/67 & 98/69.  Since  seen in HF clinic 04/23/22, he has had his sleep study, worn his zio monitor, started metoprolol succinated and has seen cardiologist.    ROS: All systems negative except as listed in HPI, PMH and Problem List.  SH:  Social History   Socioeconomic History   Marital status: Married    Spouse name: Not on file   Number of children: Not on file   Years of education: Not on file   Highest education level: Not on file  Occupational History   Not on file  Tobacco Use   Smoking status: Every Day    Packs/day: 1.00    Years: 15.00    Additional pack years: 0.00    Total pack years: 15.00    Types: Cigars, Cigarettes    Last attempt to quit: 2020    Years since quitting: 4.4   Smokeless tobacco: Never  Vaping Use   Vaping Use: Never used  Substance and Sexual Activity   Alcohol use: No   Drug use: No    Comment: prior hx of cocaine and marijuana   Sexual activity: Not on file  Other Topics Concern   Not on file  Social History Narrative   Not on file   Social Determinants of Health   Financial Resource Strain: Not on file  Food Insecurity: Not on file  Transportation Needs: Not on file  Physical Activity: Not on file  Stress: Not on file  Social Connections: Not on  file  Intimate Partner Violence: Not on file    FH:  Family History  Adopted: Yes    Past Medical History:  Diagnosis Date   CHF (congestive heart failure) (HCC)    Coronary artery disease    Inferior myocardial infarction in 2004.  He was treated with thrombolytics at [facility].  He then had an inferior MI in March 2007.  He received a Horizon study stent 3.5 x 20 mm in the distal RCA at [facility].  No other obstructive disease.   Diabetes mellitus    TYPE II   Erectile dysfunction    Hyperlipidemia    Hypertension    MI (myocardial infarction) (HCC) 1610,9604   Morbid obesity (HCC)    Sleep apnea     Current Outpatient Medications  Medication Sig Dispense Refill   albuterol (VENTOLIN HFA) 108 (90  Base) MCG/ACT inhaler Inhale 2-4 puffs by mouth every 4 hours as needed for wheezing, cough, and/or shortness of breath 6.7 g 0   AMBULATORY NON FORMULARY MEDICATION Trimix (30/1/10)-(Pap/Phent/PGE)  Test Dose  1ml vial   Qty #3 Refills 0  [facility] [phone] Fax [phone] 3 mL 0   aspirin 81 MG EC tablet Take 1 tablet (81 mg total) by mouth daily. 30 tablet 12   atorvastatin (LIPITOR) 40 MG tablet Take 80 mg by mouth daily.      blood glucose meter kit and supplies KIT Dispense based on patient and insurance preference. Use up to four times daily as directed. (FOR ICD-9 250.00, 250.01). 1 each 0   Budeson-Glycopyrrol-Formoterol (BREZTRI AEROSPHERE) 160-9-4.8 MCG/ACT AERO Inhale 2 puffs into the lungs daily. (Patient not taking: Reported on 06/06/2022)     doxycycline (VIBRAMYCIN) 100 MG capsule Take 1 capsule (100 mg total) by mouth 2 (two) times daily. 14 capsule 0   Dulaglutide (TRULICITY) 1.5 MG/0.5ML SOPN Inject into the skin. (Patient not taking: Reported on 06/06/2022)     empagliflozin (JARDIANCE) 25 MG TABS tablet Take by mouth daily.     lisinopril (PRINIVIL,ZESTRIL) 10 MG tablet Take 1 tablet (10 mg total) by mouth daily. (Patient taking differently: Take 5 mg by mouth daily.) 30 tablet 2   metoprolol succinate (TOPROL XL) 25 MG 24 hr tablet Take 1 tablet (25 mg total) by mouth daily. 90 tablet 3   nitroGLYCERIN (NITROSTAT) 0.4 MG SL tablet Place 1 tablet (0.4 mg total) under the tongue every 5 (five) minutes as needed for chest pain. 30 tablet 0   spironolactone (ALDACTONE) 25 MG tablet Take 1 tablet (25 mg total) by mouth daily. 30 tablet 5   tadalafil (CIALIS) 20 MG tablet 1 tablet 1 hour prior to intercourse 5 tablet 0   torsemide (DEMADEX) 20 MG tablet Take 2 tablets (40 mg total) by mouth daily. 90 tablet 3   TRADJENTA 5 MG TABS tablet Take 5 mg by mouth daily.     traMADol (ULTRAM) 50 MG tablet Take 50 mg by mouth every 6 (six) hours as needed for pain.      No current facility-administered medications for this visit.   There were no vitals filed for this visit.  Wt Readings from Last 3 Encounters:  06/06/22 241 lb 6 oz (109.5 kg)  05/30/22 245 lb (111.1 kg)  04/23/22 256 lb (116.1 kg)   Lab Results  Component Value Date   CREATININE 0.52 (L) 05/30/2022   CREATININE 0.71 04/23/2022   CREATININE 0.60 (L) 04/14/2022   PHYSICAL EXAM:  General:  Well appearing. No resp  difficulty HEENT: normal Neck: supple. JVP flat. No lymphadenopathy or thryomegaly appreciated. Cor: PMI normal. Regular rate & rhythm. No rubs, gallops or murmurs. Lungs: clear Abdomen: soft, nontender, nondistended. No hepatosplenomegaly. No bruits or masses.  Extremities: no cyanosis, clubbing, rash, edema Neuro: alert & oriented x3, cranial nerves grossly intact. Moves all 4 extremities w/o difficulty. Affect pleasant.  ECG: not done   ASSESSMENT & PLAN:  1: Chronic systolic HF due to ICM - NYHA class II - euvolemic today - weighing daily; reminded to call for an overnight weight gain of > 2 pounds or a weekly weight gain of > 5 pounds - weight down 15 pounds from last visit here 6 weeks ago - s/p Inferior STEMI in 6/18. Cath with LAD 20% LCx 50%d. OM4 75% RCA 100%m  -> PCI with 2 overlapping DES stents - Echo 04/17/22 EF 35-40% moderate LVH RV low normal - Echo 08/15/20 EF 45-50% - Myoview 07/22: EF45% inferior scar. No ischemia  - Continue jardiance 25mg  daily (DM dose) - Continue  spironolactone 25mg  daily - continue metoprolol 25mg  daily  - Continue lisinopril although decrease dose to 5mg  daily d/t hypotension; transition to entresto in the future if BP allows - home BP has been 89/67 & 98/69  2: PVCs - reviewed zio results w/ patient - zio showed NSR with 5 runs of VT and one run of SVT; rare PVC's/ PAC's - BMP 05/30/22 showed sodium 137, potassium 2.8, creatinine 0.52 & GFR>60, calcium 5.9 - repeat labs 06/04/22 showed potassium 5.0 & calcium  9.5  3. OSA- - high suspicion for OSA - sleep study has been done and patient says that he's still waiting to get equipment  4. DM2 - A1c 7.3 in 3/24 - Continue Jardiance - saw PCP Jesse [name]) a couple of weeks ago   5: CAD  - RCA stent placed 06/2016 - saw cardiology Jesse [name]) 04/24 - continue ASA/statin  6. Tobacco use - smoking a pack of cigars per day - encouraged cessation    Return in 1 month, sooner if needed.

## 2022-07-16 ENCOUNTER — Encounter: Payer: 59 | Admitting: Family

## 2022-07-16 ENCOUNTER — Telehealth: Payer: Self-pay | Admitting: Family

## 2022-07-16 NOTE — Telephone Encounter (Signed)
Patient did not show for his Heart Failure Clinic appointment on 07/16/22.   

## 2022-08-08 NOTE — Progress Notes (Deleted)
   08/08/2022 9:20 PM  Jesse [name] 09/10/1970 [id number]   Referring provider: Leanna Sato, MD [address] [place],  [place] [address]  Urological history: 1.  Erectile dysfunction -Contributing factors of age, CAD, HTN, MI, sleep apnea, diabetes, HLD, obesity, polysubstance abuse, anticoagulation therapy and smoking -testosterone level (05/2021) 486 -Failed PDE 5 inhibitors and vacuum erection device  No chief complaint on file.   HPI: Jesse [name] is a 52 y.o. male who presents today to discuss Trimix.    He is a patient of Dr. Heywood Footman and has failed PDE 5 inhibitors and vacuum erect devices.  Testosterone levels drawn per AUA guidelines for erectile dysfunction.  Patient is having  a rare spontaneous erections.  He denies any pain or curvature with erections.  He had a penile curvature several years ago, but it straightened itself out more time.  Physical Exam:  There were no vitals taken for this visit.  Constitutional:  Well nourished. Alert and oriented, No acute distress. GU: No CVA tenderness.  No bladder fullness or masses.  Patient with uncircumcised phallus.  Foreskin easily retracted   Urethral meatus is patent.  No penile discharge. No penile lesions or rashes. Scrotum without lesions, cysts, rashes and/or edema.   Psychiatric: Normal mood and affect.    Assessment & Plan:    1. ED -Serum testosterone pending -achieved an erection after 3 mcg of Trimix -he will return next week for another possible titration if he finds his erection fades when he returns home this afternoon -Advised patient of the condition of priapism, painful erection lasting for more than four hours, and to contact the office immediately or seek treatment in the ED   2. Peyronie's disease -Plaque palpated at the base of the penis, but not causing the patient pain or significant curvature   No follow-ups on file.  [name]  [facility] [address] [place], [place] [address] [phone]

## 2022-08-09 ENCOUNTER — Ambulatory Visit: Payer: Medicare Other | Admitting: Urology

## 2022-08-20 ENCOUNTER — Ambulatory Visit (HOSPITAL_BASED_OUTPATIENT_CLINIC_OR_DEPARTMENT_OTHER): Payer: 59 | Attending: Cardiology | Admitting: Cardiology

## 2022-08-21 NOTE — Progress Notes (Signed)
08/22/2022 9:18 PM   [name] 06/18/1970 [id number]  Referring provider: Leanna Sato, MD [address] [place],  [place] [address]  Urological history: 1.  Erectile dysfunction -Contributing factors of age, CAD, HTN, MI, sleep apnea, diabetes, HLD, obesity, polysubstance abuse, anticoagulation therapy and smoking -Failed PDE 5 inhibitors and vacuum erection device  Chief Complaint  Patient presents with   trimix    HPI: Jesse [name] is a 52 y.o. male who presents today for restart Trimix.    Previous records reviewed.  He was admitted to the ED in 02/2022 for acute on chronic CHF.  He was seen by his cardiologist in April and was to follow up in July which he has not done as of this visit.    He was last seen by me in May of 2023.  At that visit, he was able to achieve an erection in the office with 3 mcg of Trimix.  He did not find that it lasted long enough for penetration.   He would like to have another Trimix titration.     SHIM 9  Patient is not still spontaneous erections.    He denies any pain or curvature with erections.     SHIM     Row Name 08/24/22 2108         SHIM: Over the last 6 months:   How do you rate your confidence that you could get and keep an erection? Very Low     When you had erections with sexual stimulation, how often were your erections hard enough for penetration (entering your partner)? A Few Times (much less than half the time)     During sexual intercourse, how often were you able to maintain your erection after you had penetrated (entered) your partner? A Few Times (much less than half the time)     During sexual intercourse, how difficult was it to maintain your erection to completion of intercourse? Very Difficult     When you attempted sexual intercourse, how often was it satisfactory for you? A Few Times (much less than half the time)       SHIM Total Score   SHIM 9              SHIM     Row Name 08/24/22  2108         SHIM: Over the last 6 months:   How do you rate your confidence that you could get and keep an erection? Very Low     When you had erections with sexual stimulation, how often were your erections hard enough for penetration (entering your partner)? A Few Times (much less than half the time)     During sexual intercourse, how often were you able to maintain your erection after you had penetrated (entered) your partner? A Few Times (much less than half the time)     During sexual intercourse, how difficult was it to maintain your erection to completion of intercourse? Very Difficult     When you attempted sexual intercourse, how often was it satisfactory for you? A Few Times (much less than half the time)       SHIM Total Score   SHIM 9              Score: 1-7 Severe ED 8-11 Moderate ED 12-16 Mild-Moderate ED 17-21 Mild ED 22-25 No ED     PMH: Past Medical History:  Diagnosis Date   CHF (  congestive heart failure) (HCC)    Coronary artery disease    Inferior myocardial infarction in 2004.  He was treated with thrombolytics at [facility].  He then had an inferior MI in March 2007.  He received a Horizon study stent 3.5 x 20 mm in the distal RCA at [facility].  No other obstructive disease.   Diabetes mellitus    TYPE II   Erectile dysfunction    Hyperlipidemia    Hypertension    MI (myocardial infarction) (HCC) [id number]   Morbid obesity (HCC)    Sleep apnea     Surgical History: Past Surgical History:  Procedure Laterality Date   CARDIAC CATHETERIZATION     @ [facility]   CHOLECYSTECTOMY     LEFT HEART CATH AND CORONARY ANGIOGRAPHY N/A 07/05/2016   Procedure: Left Heart Cath and Coronary Angiography;  Surgeon: Alwyn Pea, MD;  Location: [facility] INVASIVE CV LAB;  Service: Cardiovascular;  Laterality: N/A;   SEPTOPLASTY      Home Medications:  Allergies as of 08/22/2022   No Known Allergies      Medication List        Accurate as of August 22, 2022  11:59 PM. If you have any questions, ask your nurse or doctor.          STOP taking these medications    albuterol 108 (90 Base) MCG/ACT inhaler Commonly known as: VENTOLIN HFA Stopped by: Michiel Cowboy   Breztri Aerosphere 160-9-4.8 MCG/ACT Aero Generic drug: Budeson-Glycopyrrol-Formoterol Stopped by: Michiel Cowboy   doxycycline 100 MG capsule Commonly known as: VIBRAMYCIN Stopped by: Kyndell Zeiser       TAKE these medications    AMBULATORY NON FORMULARY MEDICATION Trimix (30/1/10)-(Pap/Phent/PGE)  Test Dose  1ml vial   Qty #3 Refills 0  [facility] [phone] Fax [phone]   aspirin EC 81 MG tablet Take 1 tablet (81 mg total) by mouth daily.   atorvastatin 40 MG tablet Commonly known as: LIPITOR Take 80 mg by mouth daily.   blood glucose meter kit and supplies Kit Dispense based on patient and insurance preference. Use up to four times daily as directed. (FOR ICD-9 250.00, 250.01).   Jardiance 25 MG Tabs tablet Generic drug: empagliflozin Take by mouth daily.   lisinopril 10 MG tablet Commonly known as: ZESTRIL Take 1 tablet (10 mg total) by mouth daily. What changed: how much to take   metoprolol succinate 25 MG 24 hr tablet Commonly known as: Toprol XL Take 1 tablet (25 mg total) by mouth daily.   nitroGLYCERIN 0.4 MG SL tablet Commonly known as: NITROSTAT Place 1 tablet (0.4 mg total) under the tongue every 5 (five) minutes as needed for chest pain.   spironolactone 25 MG tablet Commonly known as: ALDACTONE Take 1 tablet (25 mg total) by mouth daily.   tadalafil 20 MG tablet Commonly known as: CIALIS 1 tablet 1 hour prior to intercourse   torsemide 20 MG tablet Commonly known as: DEMADEX Take 2 tablets (40 mg total) by mouth daily.   Tradjenta 5 MG Tabs tablet Generic drug: linagliptin Take 5 mg by mouth daily.   traMADol 50 MG tablet Commonly known as: ULTRAM Take 50 mg by mouth every 6 (six) hours as needed  for pain.   Trulicity 0.75 MG/0.5ML Sopn Inject into the skin.        Allergies: No Known Allergies  Family History: Family History  Adopted: Yes    Social History:  reports that he has been smoking cigars  and cigarettes. He started smoking about 19 years ago. He has a 15 pack-year smoking history. He has never used smokeless tobacco. He reports that he does not drink alcohol and does not use drugs.  ROS: Pertinent ROS in HPI  Physical Exam: BP 98/66   Pulse (!) 105   Wt 251 lb (113.9 kg)   BMI 35.01 kg/m   Constitutional:  Well nourished. Alert and oriented, No acute distress. HEENT: [place] AT, moist mucus membranes.  Trachea midline Cardiovascular: No clubbing, cyanosis, or edema. Respiratory: Normal respiratory effort, no increased work of breathing. Neurologic: Grossly intact, no focal deficits, moving all 4 extremities. Psychiatric: Normal mood and affect.  Laboratory Data: Lab Results  Component Value Date   WBC 16.5 (H) 05/30/2022   HGB 18.1 (H) 05/30/2022   HCT 55.6 (H) 05/30/2022   MCV 79.1 (L) 05/30/2022   PLT 341 05/30/2022    Lab Results  Component Value Date   CREATININE 0.52 (L) 05/30/2022    Lab Results  Component Value Date   TESTOSTERONE 486 06/19/2021    Lab Results  Component Value Date   AST 16 05/30/2022   Lab Results  Component Value Date   ALT 16 05/30/2022   I have reviewed the labs.   Pertinent Imaging: N/A  Assessment & Plan:    1. Erectile dysfunction due to arterial insufficiency -explained that he will need cardiac clearance before we proceed with another Trimix titration  -he will schedule an appointment with Dr. Juliann Pares to see if it is approprate to engage in sexual activities and use Trimix   Return for pending cardiac clearance .  These notes generated with voice recognition software. I apologize for typographical errors.  Cloretta Ned  [facility] [address]   [address] [place], [place] [address] [phone]  I spent 15 minutes on the day of the encounter to include pre-visit record review, face-to-face time with the patient, and post-visit ordering of tests.

## 2022-08-22 ENCOUNTER — Encounter: Payer: Self-pay | Admitting: Urology

## 2022-08-22 ENCOUNTER — Ambulatory Visit: Payer: 59 | Admitting: Urology

## 2022-08-22 VITALS — BP 98/66 | HR 105 | Wt 251.0 lb

## 2022-08-22 DIAGNOSIS — N5201 Erectile dysfunction due to arterial insufficiency: Secondary | ICD-10-CM | POA: Diagnosis not present

## 2022-09-13 ENCOUNTER — Other Ambulatory Visit: Payer: Self-pay

## 2022-09-13 ENCOUNTER — Other Ambulatory Visit: Payer: Self-pay | Admitting: Internal Medicine

## 2022-09-13 ENCOUNTER — Other Ambulatory Visit: Payer: Self-pay | Admitting: Family

## 2022-09-13 DIAGNOSIS — I5022 Chronic systolic (congestive) heart failure: Secondary | ICD-10-CM

## 2022-09-13 MED ORDER — TORSEMIDE 20 MG PO TABS: 40.0000 mg | ORAL_TABLET | Freq: Every day | ORAL | 0 refills | Status: DC

## 2022-09-13 NOTE — Telephone Encounter (Signed)
Attempted to call pt to sched a f/u with Verdon Cummins, FNP.  No answer at this time. Lvm with call back number.

## 2022-10-03 ENCOUNTER — Encounter: Payer: 59 | Admitting: Family

## 2022-10-03 ENCOUNTER — Telehealth: Payer: Self-pay | Admitting: Family

## 2022-10-03 NOTE — Telephone Encounter (Signed)
Patient did not show for his Heart Failure Clinic appointment on 10/03/22.

## 2022-10-03 NOTE — Progress Notes (Deleted)
PCP: Darreld Mclean, MD (last seen 2 weeks ago) Primary Cardiologist: Dorothyann Peng, MD (last seen 04/24) HF provider: Arvilla Meres, MD (last seen 04/24)  HPI:  Jesse [name] is a 52 y/o male with a history of CAD, DM2, hyperlipidemia, HTN, OSA, previous tobacco use and chronic heart failure.   Reports CAD with NSTEMI in 2004 and 2007.  Had inferior STEMI in 6/18. Cath with LAD 20% LCx 50%d. OM4 75% RCA 100%m  -> PCI with 2 overlapping DES stents   Echo 04/17/22 EF 35-40% moderate LVH RV low normal. Echo 08/15/20: EF of 45-50%   Stress test 07/2020: Abnormal myocardial perfusion scan. Mildly depressed left ventricular function mild inferior hypoejection fraction 45% there is evidence of what appears to be fixed defect in the inferior segment but no evidence of reversibility to segment mid represent scar versus artifact.    Was in the ED 05/30/22 due to a tick bite on his back. Found to be hypokalemic/ hypocalcemic. Given IV potassium, calcium gluconate and small fluid bolus. Was in the ED 04/14/22 due to a/c HF. Ultrasound negative for DVT. Diuresed with improvement of symptoms. Was in the ED 03/19/22 due to SOB and weight gain. D dimer elevated so CTA done but no PE or pneumonia.   He presents today for a HF f/u visit with a chief complaint of minimal SOB w/ moderate exertion. Chronic in nature. He has associated fatigue, cough, back pain and recent tick bite along with this. Says that his back started hurting because he thinks he got up from the floor wrong. Currently being treated for a recent tick bit. During recent EF visit, was found to be hypokalemia/hypocalcemic which he says has been checked by his PCP and that "everything's normal now".    Voice frustration with having tests done but then doesn't feel like he ever hears about the results. Also has concerns regarding all his medications and that his home blood pressure has been running low. Shows me BP readings of 89/67 & 98/69.  Since  seen in HF clinic 04/23/22, he has had his sleep study, worn his zio monitor, started metoprolol succinated and has seen cardiologist.    ROS: All systems negative except as listed in HPI, PMH and Problem List.  SH:  Social History   Socioeconomic History   Marital status: Married    Spouse name: Not on file   Number of children: Not on file   Years of education: Not on file   Highest education level: Not on file  Occupational History   Not on file  Tobacco Use   Smoking status: Every Day    Current packs/day: 0.00    Average packs/day: 1 pack/day for 15.0 years (15.0 ttl pk-yrs)    Types: Cigars, Cigarettes    Start date: 2005    Last attempt to quit: 2020    Years since quitting: 4.7   Smokeless tobacco: Never  Vaping Use   Vaping status: Never Used  Substance and Sexual Activity   Alcohol use: No   Drug use: No    Comment: prior hx of cocaine and marijuana   Sexual activity: Not on file  Other Topics Concern   Not on file  Social History Narrative   Not on file   Social Determinants of Health   Financial Resource Strain: Not on file  Food Insecurity: Not on file  Transportation Needs: Not on file  Physical Activity: Not on file  Stress: Not on file  Social Connections: Not  on file  Intimate Partner Violence: Not on file    FH:  Family History  Adopted: Yes    Past Medical History:  Diagnosis Date   CHF (congestive heart failure) (HCC)    Coronary artery disease    Inferior myocardial infarction in 2004.  He was treated with thrombolytics at [facility].  He then had an inferior MI in March 2007.  He received a Horizon study stent 3.5 x 20 mm in the distal RCA at [facility].  No other obstructive disease.   Diabetes mellitus    TYPE II   Erectile dysfunction    Hyperlipidemia    Hypertension    MI (myocardial infarction) (HCC) 3086,5784   Morbid obesity (HCC)    Sleep apnea     Current Outpatient Medications  Medication Sig Dispense Refill   AMBULATORY  NON FORMULARY MEDICATION Trimix (30/1/10)-(Pap/Phent/PGE)  Test Dose  1ml vial   Qty #3 Refills 0  [facility] [phone] Fax [phone] 3 mL 0   aspirin 81 MG EC tablet Take 1 tablet (81 mg total) by mouth daily. 30 tablet 12   atorvastatin (LIPITOR) 40 MG tablet Take 80 mg by mouth daily.      blood glucose meter kit and supplies KIT Dispense based on patient and insurance preference. Use up to four times daily as directed. (FOR ICD-9 250.00, 250.01). 1 each 0   Dulaglutide (TRULICITY) 1.5 MG/0.5ML SOPN Inject into the skin. (Patient not taking: Reported on 06/06/2022)     empagliflozin (JARDIANCE) 25 MG TABS tablet Take by mouth daily.     lisinopril (PRINIVIL,ZESTRIL) 10 MG tablet Take 1 tablet (10 mg total) by mouth daily. (Patient taking differently: Take 5 mg by mouth daily.) 30 tablet 2   metoprolol succinate (TOPROL XL) 25 MG 24 hr tablet Take 1 tablet (25 mg total) by mouth daily. 90 tablet 3   nitroGLYCERIN (NITROSTAT) 0.4 MG SL tablet Place 1 tablet (0.4 mg total) under the tongue every 5 (five) minutes as needed for chest pain. 30 tablet 0   spironolactone (ALDACTONE) 25 MG tablet TAKE 1 TABLET (25 MG TOTAL) BY MOUTH DAILY. 90 tablet 1   tadalafil (CIALIS) 20 MG tablet 1 tablet 1 hour prior to intercourse 5 tablet 0   torsemide (DEMADEX) 20 MG tablet Take 2 tablets (40 mg total) by mouth daily. 60 tablet 0   TRADJENTA 5 MG TABS tablet Take 5 mg by mouth daily.     traMADol (ULTRAM) 50 MG tablet Take 50 mg by mouth every 6 (six) hours as needed for pain.     No current facility-administered medications for this visit.   There were no vitals filed for this visit.  Wt Readings from Last 3 Encounters:  08/22/22 251 lb (113.9 kg)  06/06/22 241 lb 6 oz (109.5 kg)  05/30/22 245 lb (111.1 kg)   Lab Results  Component Value Date   CREATININE 0.52 (L) 05/30/2022   CREATININE 0.71 04/23/2022   CREATININE 0.60 (L) 04/14/2022   PHYSICAL EXAM:  General:  Well  appearing. No resp difficulty HEENT: normal Neck: supple. JVP flat. No lymphadenopathy or thryomegaly appreciated. Cor: PMI normal. Regular rate & rhythm. No rubs, gallops or murmurs. Lungs: clear Abdomen: soft, nontender, nondistended. No hepatosplenomegaly. No bruits or masses.  Extremities: no cyanosis, clubbing, rash, edema Neuro: alert & oriented x3, cranial nerves grossly intact. Moves all 4 extremities w/o difficulty. Affect pleasant.  ECG: not done   ASSESSMENT & PLAN:  1: Chronic systolic HF  due to ICM - NYHA class II - euvolemic today - weighing daily; reminded to call for an overnight weight gain of > 2 pounds or a weekly weight gain of > 5 pounds - weight down 15 pounds from last visit here 6 weeks ago - s/p Inferior STEMI in 6/18. Cath with LAD 20% LCx 50%d. OM4 75% RCA 100%m  -> PCI with 2 overlapping DES stents - Echo 04/17/22 EF 35-40% moderate LVH RV low normal - Echo 08/15/20 EF 45-50% - Myoview 07/22: EF45% inferior scar. No ischemia  - Continue jardiance 25mg  daily (DM dose) - Continue  spironolactone 25mg  daily - continue metoprolol 25mg  daily  - Continue lisinopril although decrease dose to 5mg  daily d/t hypotension; transition to entresto in the future if BP allows - home BP has been 89/67 & 98/69  2: PVCs - reviewed zio results w/ patient - zio showed NSR with 5 runs of VT and one run of SVT; rare PVC's/ PAC's - BMP 05/30/22 showed sodium 137, potassium 2.8, creatinine 0.52 & GFR>60, calcium 5.9 - repeat labs 06/04/22 showed potassium 5.0 & calcium 9.5  3. OSA- - high suspicion for OSA - sleep study has been done and patient says that he's still waiting to get equipment  4. DM2 - A1c 7.3 in 3/24 - Continue Jardiance - saw PCP Jesse [name]) a couple of weeks ago   5: CAD  - RCA stent placed 06/2016 - saw cardiology Jesse [name]) 04/24 - continue ASA/statin  6. Tobacco use - smoking a pack of cigars per day - encouraged cessation    Return in 1 month,  sooner if needed.

## 2022-10-14 ENCOUNTER — Ambulatory Visit: Payer: 59 | Admitting: Family

## 2022-10-14 ENCOUNTER — Other Ambulatory Visit: Payer: Self-pay | Admitting: Family

## 2022-10-14 ENCOUNTER — Encounter: Payer: Self-pay | Admitting: Family

## 2022-10-14 ENCOUNTER — Ambulatory Visit
Admission: RE | Admit: 2022-10-14 | Discharge: 2022-10-14 | Disposition: A | Discharge status: Home / Self Care | Payer: 59 | Source: Ambulatory Visit | Attending: Family

## 2022-10-14 VITALS — BP 92/67 | HR 85 | Ht 71.0 in | Wt 249.0 lb

## 2022-10-14 DIAGNOSIS — I10 Essential (primary) hypertension: Secondary | ICD-10-CM

## 2022-10-14 DIAGNOSIS — I252 Old myocardial infarction: Secondary | ICD-10-CM | POA: Insufficient documentation

## 2022-10-14 DIAGNOSIS — E119 Type 2 diabetes mellitus without complications: Secondary | ICD-10-CM | POA: Diagnosis not present

## 2022-10-14 DIAGNOSIS — Z7902 Long term (current) use of antithrombotics/antiplatelets: Secondary | ICD-10-CM | POA: Diagnosis not present

## 2022-10-14 DIAGNOSIS — I110 Hypertensive heart disease with heart failure: Secondary | ICD-10-CM | POA: Insufficient documentation

## 2022-10-14 DIAGNOSIS — I5022 Chronic systolic (congestive) heart failure: Secondary | ICD-10-CM | POA: Insufficient documentation

## 2022-10-14 DIAGNOSIS — Z79899 Other long term (current) drug therapy: Secondary | ICD-10-CM | POA: Diagnosis not present

## 2022-10-14 DIAGNOSIS — E1165 Type 2 diabetes mellitus with hyperglycemia: Secondary | ICD-10-CM | POA: Diagnosis not present

## 2022-10-14 DIAGNOSIS — I2510 Atherosclerotic heart disease of native coronary artery without angina pectoris: Secondary | ICD-10-CM | POA: Insufficient documentation

## 2022-10-14 DIAGNOSIS — Z955 Presence of coronary angioplasty implant and graft: Secondary | ICD-10-CM | POA: Diagnosis not present

## 2022-10-14 DIAGNOSIS — F17210 Nicotine dependence, cigarettes, uncomplicated: Secondary | ICD-10-CM | POA: Diagnosis not present

## 2022-10-14 DIAGNOSIS — G4733 Obstructive sleep apnea (adult) (pediatric): Secondary | ICD-10-CM | POA: Insufficient documentation

## 2022-10-14 DIAGNOSIS — F17290 Nicotine dependence, other tobacco product, uncomplicated: Secondary | ICD-10-CM | POA: Diagnosis not present

## 2022-10-14 DIAGNOSIS — E785 Hyperlipidemia, unspecified: Secondary | ICD-10-CM | POA: Diagnosis not present

## 2022-10-14 DIAGNOSIS — Z720 Tobacco use: Secondary | ICD-10-CM

## 2022-10-14 LAB — BASIC METABOLIC PANEL
Anion gap: 10 (ref 5–15)
BUN: 17 mg/dL (ref 6–20)
CO2: 29 mmol/L (ref 22–32)
Calcium: 8.1 mg/dL — ABNORMAL LOW (ref 8.9–10.3)
Chloride: 96 mmol/L — ABNORMAL LOW (ref 98–111)
Creatinine, Ser: 0.9 mg/dL (ref 0.61–1.24)
GFR, Estimated: >60 mL/min (ref >60)
Glucose, Bld: 299 mg/dL — ABNORMAL HIGH (ref 70–99)
Potassium: 3.6 mmol/L (ref 3.5–5.1)
Sodium: 135 mmol/L (ref 135–145)

## 2022-10-14 LAB — BRAIN NATRIURETIC PEPTIDE: B Natriuretic Peptide: 534.9 pg/mL — ABNORMAL HIGH (ref 0.0–100.0)

## 2022-10-14 MED ORDER — FUROSEMIDE 10 MG/ML IJ SOLN
INTRAMUSCULAR | Status: AC
  Filled 2022-10-14: qty 8

## 2022-10-14 MED ORDER — FUROSEMIDE 10 MG/ML IJ SOLN
80.0000 mg | Freq: Once | INTRAMUSCULAR | Status: AC
  Administered 2022-10-14: 80 mg via INTRAVENOUS

## 2022-10-14 NOTE — Progress Notes (Signed)
   10/14/22 0950  ReDS Vest / Clip  Station Marker C  Ruler Value 34  ReDS Value Range (!) > 40  ReDS Actual Value 41

## 2022-10-14 NOTE — Addendum Note (Signed)
Addended by: Reesa Chew on: 10/14/2022 06:50 PM   Modules accepted: Orders

## 2022-10-14 NOTE — Progress Notes (Signed)
PCP: Darreld Mclean, MD (last seen few months ago) Primary Cardiologist: Dorothyann Peng, MD (last seen 04/24)   HPI:  Jesse [name] is a 52 y/o male with a history of CAD, DM2, hyperlipidemia, HTN, OSA, previous tobacco use and chronic heart failure. Reports CAD with NSTEMI in 2004 and 2007. Had inferior STEMI in 6/18. Cath with LAD 20% LCx 50%d. OM4 75% RCA 100%m  -> PCI with 2 overlapping DES stents  Was in the ED 03/19/22 due to SOB and weight gain. D dimer elevated so CTA done but no PE or pneumonia. Was in the ED 04/14/22 due to a/c HF. Ultrasound negative for DVT. Diuresed with improvement of symptoms. Was in the ED 05/30/22 due to a tick bite on his back. Found to be hypokalemic/ hypocalcemic. Given IV potassium, calcium gluconate and small fluid bolus.   Stress test 07/2020: Abnormal myocardial perfusion scan. Mildly depressed left ventricular function mild inferior hypoejection fraction 45% there is evidence of what appears to be fixed defect in the inferior segment but no evidence of reversibility to segment mid represent scar versus artifact.   Echo 08/15/20: EF of 45-50% Echo 04/17/22 EF 35-40% moderate LVH RV low normal.    He presents today for a HF f/u visit with a chief complaint of moderate fatigue with minimal exertion. Chronic in nature. Has associated worsening shortness of breath, chest heaviness at time, pedal edema (worsening), intermittent light-headedness and chronic difficulty sleeping along with this. Denies abdominal abdominal distention (although feels bloated) or chest pain. Has to return for CPAP titration so that he can begin wearing CPAP at home.   At last visit, lisinopril was decreased to 5mg  daily due to hypotension & then he stopped it due to home SBP in the 70's. He also stopped his metoprolol and spironolactone due to low BP. Since he stopped the spiro ~ 1 week ago, his SOB/ pedal edema have worsened. He's also noticed that his HR at home has been running in the 90's-low  100's.   Drinks very little water but drinks 160 oz of soda daily due to a dry mouth.   ROS: All systems negative except as listed in HPI, PMH and Problem List.  SH:  Social History   Socioeconomic History   Marital status: Married    Spouse name: Not on file   Number of children: Not on file   Years of education: Not on file   Highest education level: Not on file  Occupational History   Not on file  Tobacco Use   Smoking status: Every Day    Current packs/day: 0.00    Average packs/day: 1 pack/day for 15.0 years (15.0 ttl pk-yrs)    Types: Cigars, Cigarettes    Start date: 2005    Last attempt to quit: 2020    Years since quitting: 4.7   Smokeless tobacco: Never  Vaping Use   Vaping status: Never Used  Substance and Sexual Activity   Alcohol use: No   Drug use: No    Comment: prior hx of cocaine and marijuana   Sexual activity: Not on file  Other Topics Concern   Not on file  Social History Narrative   Not on file   Social Determinants of Health   Financial Resource Strain: Not on file  Food Insecurity: Not on file  Transportation Needs: Not on file  Physical Activity: Not on file  Stress: Not on file  Social Connections: Not on file  Intimate Partner Violence: Not on file  FH:  Family History  Adopted: Yes    Past Medical History:  Diagnosis Date   CHF (congestive heart failure) (HCC)    Coronary artery disease    Inferior myocardial infarction in 2004.  He was treated with thrombolytics at [facility].  He then had an inferior MI in March 2007.  He received a Horizon study stent 3.5 x 20 mm in the distal RCA at [facility].  No other obstructive disease.   Diabetes mellitus    TYPE II   Erectile dysfunction    Hyperlipidemia    Hypertension    MI (myocardial infarction) (HCC) 9629,5284   Morbid obesity (HCC)    Sleep apnea     Current Outpatient Medications  Medication Sig Dispense Refill   AMBULATORY NON FORMULARY MEDICATION Trimix  (30/1/10)-(Pap/Phent/PGE)  Test Dose  1ml vial   Qty #3 Refills 0  [facility] [phone] Fax [phone] 3 mL 0   aspirin 81 MG EC tablet Take 1 tablet (81 mg total) by mouth daily. 30 tablet 12   atorvastatin (LIPITOR) 40 MG tablet Take 80 mg by mouth daily.      blood glucose meter kit and supplies KIT Dispense based on patient and insurance preference. Use up to four times daily as directed. (FOR ICD-9 250.00, 250.01). 1 each 0   Dulaglutide (TRULICITY) 1.5 MG/0.5ML SOPN Inject into the skin. (Patient not taking: Reported on 06/06/2022)     empagliflozin (JARDIANCE) 25 MG TABS tablet Take by mouth daily.     lisinopril (PRINIVIL,ZESTRIL) 10 MG tablet Take 1 tablet (10 mg total) by mouth daily. (Patient taking differently: Take 5 mg by mouth daily.) 30 tablet 2   metoprolol succinate (TOPROL XL) 25 MG 24 hr tablet Take 1 tablet (25 mg total) by mouth daily. 90 tablet 3   nitroGLYCERIN (NITROSTAT) 0.4 MG SL tablet Place 1 tablet (0.4 mg total) under the tongue every 5 (five) minutes as needed for chest pain. 30 tablet 0   spironolactone (ALDACTONE) 25 MG tablet TAKE 1 TABLET (25 MG TOTAL) BY MOUTH DAILY. 90 tablet 1   tadalafil (CIALIS) 20 MG tablet 1 tablet 1 hour prior to intercourse 5 tablet 0   torsemide (DEMADEX) 20 MG tablet Take 2 tablets (40 mg total) by mouth daily. 60 tablet 0   TRADJENTA 5 MG TABS tablet Take 5 mg by mouth daily.     traMADol (ULTRAM) 50 MG tablet Take 50 mg by mouth every 6 (six) hours as needed for pain.     No current facility-administered medications for this visit.   Vitals:   10/14/22 0931  BP: 92/67  Pulse: 85  SpO2: 100%  Weight: 249 lb (112.9 kg)  Height: 5\' 11"  (1.803 m)   Wt Readings from Last 3 Encounters:  10/14/22 249 lb (112.9 kg)  08/22/22 251 lb (113.9 kg)  06/06/22 241 lb 6 oz (109.5 kg)   Lab Results  Component Value Date   CREATININE 0.52 (L) 05/30/2022   CREATININE 0.71 04/23/2022   CREATININE 0.60 (L) 04/14/2022    PHYSICAL EXAM:  General:  Well appearing. No resp difficulty HEENT: normal Neck: supple. JVP flat. No lymphadenopathy or thryomegaly appreciated. Cor: PMI normal. Regular rate & rhythm. No rubs, gallops or murmurs. Lungs: clear Abdomen: soft, nontender, nondistended. No hepatosplenomegaly. No bruits or masses.  Extremities: no cyanosis, clubbing, rash, 2+ pitting edema bilaterally to knees Neuro: alert & oriented x3, cranial nerves grossly intact. Moves all 4 extremities w/o difficulty. Affect pleasant.  ECG:  not done  ReDs: 41%   ASSESSMENT & PLAN:  1: Chronic systolic HF due to ICM - NYHA class II - moderately fluid overloaded with worsening symptoms, weight gain and elevated ReDs reading - not weighing daily; encouraged to resume weighing so that he can call for an overnight weight gain of > 2 pounds or a weekly weight gain of > 5 pounds - weight up 8 pounds from last visit here 4 months ago - ReDs 41% - emphasized decreasing his fluid intake from 160 oz daily to at least 80 oz and substituting some of the soda w/ water - not adding salt but eats salty foods like fast food especially when working - will send for 80mg  IV lasix later today - BMP/ BNP with results prior to lasix to know whether he needs oral potassium or not - s/p Inferior STEMI in 6/18. Cath with LAD 20% LCx 50%d. OM4 75% RCA 100%m  -> PCI with 2 overlapping DES stents - Myoview 07/22: EF45% inferior scar. No ischemia  - Echo 08/15/20 EF 45-50% - Echo 04/17/22 EF 35-40% moderate LVH RV low normal - continue jardiance 25mg  daily (DM dose) - resume spironolactone at 12.5mg  daily - continue holding metoprolol (may try low dose carvedilol instead so less BP issues) - continue holding lisinopril - get compression socks and put them on every morning w/ removal at bedtime - BNP 04/23/22 was 443.6  2: HTN- - saw PCP Marvis Moeller) few months ago - BMP 05/30/22 showed sodium 137, potassium 2.8, creatinine 0.52 & GFR>60,  calcium 5.9 - repeat labs 06/04/22 showed potassium 5.0 & calcium 9.5 - BMP later today  3. OSA- - high suspicion for OSA - sleep study has been done but he needs to return for CPAP titration; emphasized the importance of getting this done as improvement of symptoms, especially fatigue, should improve  4. DM2 - A1c 7.3% in 3/24 - continue Jardiance 25mg  daily   5: CAD  - RCA stent placed 06/2016 - saw cardiology Juliann Pares) 04/24 - continue clopidogrel 75mg  daily - Cath 06/2016 with LAD 20% LCx 50%d. OM4 75% RCA 100%m  -> PCI with 2 overlapping DES stents  6. Tobacco use - smoking a pack of cigars per day - encouraged cessation  Return  tomorrow

## 2022-10-14 NOTE — Patient Instructions (Addendum)
Resume weighing daily and call for an overnight weight gain of 3 pounds or more or a weekly weight gain of more than 5 pounds.   Resume spironolactone at 1/2 tablet tablet

## 2022-10-15 ENCOUNTER — Encounter: Payer: Self-pay | Admitting: Pharmacy Technician

## 2022-10-15 ENCOUNTER — Encounter: Payer: Self-pay | Admitting: Family

## 2022-10-15 ENCOUNTER — Ambulatory Visit: Payer: 59 | Attending: Family | Admitting: Family

## 2022-10-15 VITALS — BP 102/80 | HR 88 | Wt 250.0 lb

## 2022-10-15 DIAGNOSIS — I252 Old myocardial infarction: Secondary | ICD-10-CM | POA: Diagnosis not present

## 2022-10-15 DIAGNOSIS — E1165 Type 2 diabetes mellitus with hyperglycemia: Secondary | ICD-10-CM

## 2022-10-15 DIAGNOSIS — F17290 Nicotine dependence, other tobacco product, uncomplicated: Secondary | ICD-10-CM | POA: Insufficient documentation

## 2022-10-15 DIAGNOSIS — Z7902 Long term (current) use of antithrombotics/antiplatelets: Secondary | ICD-10-CM | POA: Insufficient documentation

## 2022-10-15 DIAGNOSIS — Z720 Tobacco use: Secondary | ICD-10-CM

## 2022-10-15 DIAGNOSIS — I5022 Chronic systolic (congestive) heart failure: Secondary | ICD-10-CM | POA: Diagnosis present

## 2022-10-15 DIAGNOSIS — I10 Essential (primary) hypertension: Secondary | ICD-10-CM | POA: Diagnosis not present

## 2022-10-15 DIAGNOSIS — Z955 Presence of coronary angioplasty implant and graft: Secondary | ICD-10-CM | POA: Insufficient documentation

## 2022-10-15 DIAGNOSIS — I2510 Atherosclerotic heart disease of native coronary artery without angina pectoris: Secondary | ICD-10-CM | POA: Insufficient documentation

## 2022-10-15 DIAGNOSIS — G4733 Obstructive sleep apnea (adult) (pediatric): Secondary | ICD-10-CM | POA: Diagnosis not present

## 2022-10-15 DIAGNOSIS — E119 Type 2 diabetes mellitus without complications: Secondary | ICD-10-CM | POA: Diagnosis not present

## 2022-10-15 DIAGNOSIS — I110 Hypertensive heart disease with heart failure: Secondary | ICD-10-CM | POA: Diagnosis not present

## 2022-10-15 DIAGNOSIS — Z79899 Other long term (current) drug therapy: Secondary | ICD-10-CM | POA: Insufficient documentation

## 2022-10-15 NOTE — Progress Notes (Signed)
   10/15/22 0903  ReDS Vest / Clip  Station Marker C  Ruler Value 34  ReDS Value Range 36 - 40  ReDS Actual Value 38

## 2022-10-15 NOTE — Telephone Encounter (Signed)
Prior Authorization for titration sent to [employer] [address] via web portal. Tracking Number . Prior Authorization/Notification is not required for the requested service(s).  Decision ID #: [id number]

## 2022-10-15 NOTE — Progress Notes (Signed)
PCP: Darreld Mclean, MD (last seen few months ago) Primary Cardiologist: Dorothyann Peng, MD (last seen 04/24)   HPI:  Jesse [name] is a 52 y/o male with a history of CAD, DM2, hyperlipidemia, HTN, OSA, previous tobacco use and chronic heart failure. Reports CAD with NSTEMI in 2004 and 2007. Had inferior STEMI in 6/18. Cath with LAD 20% LCx 50%d. OM4 75% RCA 100%m  -> PCI with 2 overlapping DES stents  Was in the ED 03/19/22 due to SOB and weight gain. D dimer elevated so CTA done but no PE or pneumonia. Was in the ED 04/14/22 due to a/c HF. Ultrasound negative for DVT. Diuresed with improvement of symptoms. Was in the ED 05/30/22 due to a tick bite on his back. Found to be hypokalemic/ hypocalcemic. Given IV potassium, calcium gluconate and small fluid bolus.   Stress test 07/2020: Abnormal myocardial perfusion scan. Mildly depressed left ventricular function mild inferior hypoejection fraction 45% there is evidence of what appears to be fixed defect in the inferior segment but no evidence of reversibility to segment mid represent scar versus artifact.   Echo 08/15/20: EF of 45-50% Echo 04/17/22 EF 35-40% moderate LVH RV low normal.    He presents today for a HF f/u visit with a chief complaint of minimal SOB with moderate exertion. Improved from last visit here yesterday. Has associated fatigue (improving), pedal edema (improving) and improvement in his abdominal bloating. He denies chest pain, cough, palpitations, dizziness, difficulty sleeping or weight gain. He says that he was able to walk up to the office from the parking lot without any symptoms. Yesterday, he says that he couldn't walk 20 feet without becoming symptomatic.   Yesterday he received 80mg  IV lasix and resumed his spironolactone at 12.5mg . He says that he feels like resuming the spironolactone has helped more than the IV lasix. Wife has ordered compression socks.   ROS: All systems negative except as listed in HPI, PMH and Problem  List.  SH:  Social History   Socioeconomic History   Marital status: Married    Spouse name: Not on file   Number of children: Not on file   Years of education: Not on file   Highest education level: Not on file  Occupational History   Not on file  Tobacco Use   Smoking status: Every Day    Current packs/day: 0.00    Average packs/day: 1 pack/day for 15.0 years (15.0 ttl pk-yrs)    Types: Cigars, Cigarettes    Start date: 2005    Last attempt to quit: 2020    Years since quitting: 4.7   Smokeless tobacco: Never  Vaping Use   Vaping status: Never Used  Substance and Sexual Activity   Alcohol use: No   Drug use: No    Comment: prior hx of cocaine and marijuana   Sexual activity: Not on file  Other Topics Concern   Not on file  Social History Narrative   Not on file   Social Determinants of Health   Financial Resource Strain: Not on file  Food Insecurity: Not on file  Transportation Needs: Not on file  Physical Activity: Not on file  Stress: Not on file  Social Connections: Not on file  Intimate Partner Violence: Not on file    FH:  Family History  Adopted: Yes    Past Medical History:  Diagnosis Date   CHF (congestive heart failure) (HCC)    Coronary artery disease    Inferior myocardial infarction in  2004.  He was treated with thrombolytics at [facility].  He then had an inferior MI in March 2007.  He received a Horizon study stent 3.5 x 20 mm in the distal RCA at [facility].  No other obstructive disease.   Diabetes mellitus    TYPE II   Erectile dysfunction    Hyperlipidemia    Hypertension    MI (myocardial infarction) (HCC) 4098,1191   Morbid obesity (HCC)    Sleep apnea     Current Outpatient Medications  Medication Sig Dispense Refill   AMBULATORY NON FORMULARY MEDICATION Trimix (30/1/10)-(Pap/Phent/PGE)  Test Dose  1ml vial   Qty #3 Refills 0  [facility] [phone] Fax [phone] 3 mL 0   atorvastatin (LIPITOR) 40 MG tablet  Take 80 mg by mouth daily.      clopidogrel (PLAVIX) 75 MG tablet Take 75 mg by mouth daily.     Dulaglutide (TRULICITY) 1.5 MG/0.5ML SOPN Inject into the skin. (Patient not taking: Reported on 10/14/2022)     empagliflozin (JARDIANCE) 25 MG TABS tablet Take by mouth daily.     lisinopril (PRINIVIL,ZESTRIL) 10 MG tablet Take 1 tablet (10 mg total) by mouth daily. (Patient not taking: Reported on 10/14/2022) 30 tablet 2   metoprolol succinate (TOPROL XL) 25 MG 24 hr tablet Take 1 tablet (25 mg total) by mouth daily. (Patient not taking: Reported on 10/14/2022) 90 tablet 3   nitroGLYCERIN (NITROSTAT) 0.4 MG SL tablet Place 1 tablet (0.4 mg total) under the tongue every 5 (five) minutes as needed for chest pain. (Patient not taking: Reported on 10/14/2022) 30 tablet 0   spironolactone (ALDACTONE) 25 MG tablet TAKE 1 TABLET (25 MG TOTAL) BY MOUTH DAILY. (Patient taking differently: Take 12.5 mg by mouth daily.) 90 tablet 1   torsemide (DEMADEX) 20 MG tablet Take 2 tablets (40 mg total) by mouth daily. 60 tablet 0   TRADJENTA 5 MG TABS tablet Take 5 mg by mouth daily.     traMADol (ULTRAM) 50 MG tablet Take 50 mg by mouth every 6 (six) hours as needed for pain.     No current facility-administered medications for this visit.   Vitals:   10/15/22 0858  BP: 102/80  Pulse: 88  SpO2: 100%  Weight: 250 lb (113.4 kg)   Wt Readings from Last 3 Encounters:  10/15/22 250 lb (113.4 kg)  10/14/22 249 lb (112.9 kg)  08/22/22 251 lb (113.9 kg)   Lab Results  Component Value Date   CREATININE 0.90 10/14/2022   CREATININE 0.52 (L) 05/30/2022   CREATININE 0.71 04/23/2022    PHYSICAL EXAM:  General:  Well appearing. No resp difficulty HEENT: normal Neck: supple. JVP flat. No lymphadenopathy or thryomegaly appreciated. Cor: PMI normal. Regular rate & rhythm. No rubs, gallops or murmurs. Lungs: clear Abdomen: soft, nontender, nondistended. No hepatosplenomegaly. No bruits or masses.  Extremities: no  cyanosis, clubbing, rash, 2+ pitting edema bilaterally although softer and not as far to the knees Neuro: alert & oriented x3, cranial nerves grossly intact. Moves all 4 extremities w/o difficulty. Affect pleasant.  ECG: not done  ReDs: 38%   ASSESSMENT & PLAN:  1: Chronic systolic HF due to ICM - NYHA class II - minimally fluid overloaded although improved from yesterday - not weighing daily; encouraged to resume weighing so that he can call for an overnight weight gain of > 2 pounds or a weekly weight gain of > 5 pounds - weight unchanged from last visit here yesterday ago -  ReDs 38%; yesterday it was 41% - he says that he's decreased his fluid intake and will continue to work on it (was drinking ~ 160 oz soda daily) - not adding salt but eats salty foods like fast food especially when working - received 80mg  IV lasix yesterday and resumed 12.5mg  spironolactone - s/p Inferior STEMI in 6/18. Cath with LAD 20% LCx 50%d. OM4 75% RCA 100%m  -> PCI with 2 overlapping DES stents - Myoview 07/22: EF45% inferior scar. No ischemia  - Echo 08/15/20 EF 45-50% - Echo 04/17/22 EF 35-40% moderate LVH RV low normal - continue jardiance 25mg  daily (DM dose) - increase spironolactone to 25mg  daily - BMP/BNP next visit in 2 days - continue holding metoprolol (may try low dose carvedilol instead so less BP issues) - continue holding lisinopril - he says his wife has ordered the compression socks - BNP 10/14/22 was 534.9 - PharmD reconciled meds w/ patient  2: HTN- - BP 102/80 - saw PCP Marvis Moeller) few months ago - BMP 10/14/22 showed sodium 135, potassium 3.6, creatinine 0.90 & GFR>60 - BMP next visit  3. OSA- - high suspicion for OSA - sleep study has been done but he needs to return for CPAP titration; emphasized the importance of getting this done as improvement of symptoms, especially fatigue, should improve  4. DM2 - A1c 7.3% in 3/24 - continue Jardiance 25mg  daily   5: CAD  - saw  cardiology Juliann Pares) 04/24 - continue clopidogrel 75mg  daily - Cath 06/2016 with LAD 20% LCx 50%d. OM4 75% RCA 100%m  -> PCI with 2 overlapping DES stents  6. Tobacco use - smoking a pack of cigars per day - encouraged cessation  Return in 2 days, sooner if needed.

## 2022-10-15 NOTE — Consult Note (Signed)
[facility] - HEART FAILURE CLINIC - PHARMACIST COUNSELING NOTE  Guideline-Directed Medical Therapy/Evidence Based Medicine  ACE/ARB/ARNI: Lisinopril 10 mg daily (currently held) Beta Blocker: Metoprolol succinate 25 mg daily (currently held) Aldosterone Antagonist: Spironolactone 12.5 mg daily Diuretic: Torsemide 40 mg daily SGLT2i: Empagliflozin 25 mg daily  Adherence Assessment  Do you ever forget to take your medication? [x] Yes [] No  Do you ever skip doses due to side effects? [] Yes [x] No  Do you have trouble affording your medicines? [] Yes [x] No  Are you ever unable to pick up your medication due to transportation difficulties? [] Yes [x] No  Do you ever stop taking your medications because you don't believe they are helping? [] Yes [x] No  Do you check your weight daily? [] Yes [x] No   Adherence strategy: Pill box and spouse  Barriers to obtaining medications: N/A  Vital signs: HR 88, BP 102/80, weight (pounds) 250 ECHO: Date 04/17/2022, EF 35-40%, notes LV global hypokinesis; LV mild dilation; moderate concentric LVH; LA mild to moderate dilation; trivial MVR Cath: Date 07/05/2016, EF 45-50%, notes mid RCA to dist RCA lesion, 100% stenosed; successful PCI stent to mid RCA deployment of 2 overlapping DES stents, reducing stenosis to 0%     Latest Ref Rng & Units 10/14/2022    1:26 PM 05/30/2022    5:23 PM 04/23/2022   11:Jesse AM  BMP  Glucose 70 - 99 mg/dL 161  096  045   BUN 6 - 20 mg/dL 17  15  14    Creatinine 0.61 - 1.24 mg/dL 4.09  8.11  9.14   Sodium 135 - 145 mmol/L 135  137  132   Potassium 3.5 - 5.1 mmol/L 3.6  2.8  4.3   Chloride 98 - 111 mmol/L 96  108  96   CO2 22 - 32 mmol/L 29  17  28    Calcium 8.9 - 10.3 mg/dL 8.1  5.9  8.9     Past Medical History:  Diagnosis Date   CHF (congestive heart failure) (HCC)    Coronary artery disease    Inferior myocardial infarction in 2004.  He was treated with thrombolytics at [facility].  He then had an  inferior MI in March 2007.  He received a Horizon study stent 3.5 x 20 mm in the distal RCA at [facility].  No other obstructive disease.   Diabetes mellitus    TYPE II   Erectile dysfunction    Hyperlipidemia    Hypertension    MI (myocardial infarction) [facility]) [id number]   Morbid obesity (HCC)    Sleep apnea     ASSESSMENT 52 year old [name] who presents to the HF clinic for follow-up. Patient came to [facility] yesterday and found to have elevated ReDS of 41%, also complaining of weakness and muddied thinking. He was sent for furosemide 80 mg IV x 1 and returned to [facility] today. Patient endorses feeling much better today, and ReDS decreased to 38%. When talking about what may have precipitated the fluid overload, he says he had not been monitoring his fluid intake nor his sodium intake. I re-educated patient on the importance of adhering to these restrictions.  Recent ED Visit (past 6 months):  Date -  05/30/2022, CC - Tick bite  04/14/2022, CC - AoCHF  PLAN HFrEF / HTN Per discussion with [facility] provider, we will increase spironolactone to 25 mg daily Recommend checking BMP in around one week Continue holding lisinopril and metoprolol succinate 25 mg Continue spironolactone, empagliflozin 25 mg daily, and torsemide  40 mg daily Recommend increasing patient's focus on non-pharmacological measures of managing fluid including adhering to fluid restrictions of 2 L or less and restricting sodium to <2000 mg/day CAD / HLD Last lipid panel from 2018. Patient's PCP does not interface with Epic so we are unable to assess any labs they have drawn.. Recommend checking lipid panel with next labs Continue atorvastatin 80 mg and clopidogrel 75 mg daily Patient says nitroglycerin bottle he has is really old; I will ask [facility] provider to send new prescription DM Last A1c on file was 11.5% but it is from 2011 Recommend checking A1c with next labs Continue empagliflozin 25 mg daily and linagliptin 5 mg daily   Time  spent: 15 minutes  Orson Aloe, Pharm.D. Clinical Pharmacist 10/15/2022 9:31 AM    Current Outpatient Medications:    atorvastatin (LIPITOR) 40 MG tablet, Take 80 mg by mouth daily. , Disp: , Rfl:    clopidogrel (PLAVIX) 75 MG tablet, Take 75 mg by mouth daily., Disp: , Rfl:    empagliflozin (JARDIANCE) 25 MG TABS tablet, Take 25 mg by mouth daily., Disp: , Rfl:    nitroGLYCERIN (NITROSTAT) 0.4 MG SL tablet, Place 1 tablet (0.4 mg total) under the tongue every 5 (five) minutes as needed for chest pain., Disp: 30 tablet, Rfl: 0   spironolactone (ALDACTONE) 25 MG tablet, TAKE 1 TABLET (25 MG TOTAL) BY MOUTH DAILY. (Patient taking differently: Take 12.5 mg by mouth daily.), Disp: 90 tablet, Rfl: 1   torsemide (DEMADEX) 20 MG tablet, Take 2 tablets (40 mg total) by mouth daily., Disp: 60 tablet, Rfl: 0   TRADJENTA 5 MG TABS tablet, Take 5 mg by mouth daily., Disp: , Rfl:    traMADol (ULTRAM) 50 MG tablet, Take 50 mg by mouth every 6 (six) hours as needed for pain., Disp: , Rfl:    AMBULATORY NON FORMULARY MEDICATION, Trimix (30/1/10)-(Pap/Phent/PGE)  Test Dose  1ml vial   Qty #3 Refills 0  [facility] [phone] Fax [phone] (Patient not taking: Reported on 10/15/2022), Disp: 3 mL, Rfl: 0   Dulaglutide (TRULICITY) 1.5 MG/0.5ML SOPN, Inject into the skin. (Patient not taking: Reported on 10/14/2022), Disp: , Rfl:    lisinopril (PRINIVIL,ZESTRIL) 10 MG tablet, Take 1 tablet (10 mg total) by mouth daily. (Patient not taking: Reported on 10/14/2022), Disp: 30 tablet, Rfl: 2   metoprolol succinate (TOPROL XL) 25 MG 24 hr tablet, Take 1 tablet (25 mg total) by mouth daily. (Patient not taking: Reported on 10/14/2022), Disp: 90 tablet, Rfl: 3 No current facility-administered medications for this visit.

## 2022-10-15 NOTE — Patient Instructions (Addendum)
INCREASE SPIRONOLACTONE to 25 MD ONCE DAILY (1 tablet)

## 2022-10-17 ENCOUNTER — Encounter: Payer: Self-pay | Admitting: Family

## 2022-10-17 ENCOUNTER — Other Ambulatory Visit
Admission: RE | Admit: 2022-10-17 | Discharge: 2022-10-17 | Disposition: A | Discharge status: Home / Self Care | Payer: 59 | Source: Ambulatory Visit | Attending: Family | Admitting: Family

## 2022-10-17 ENCOUNTER — Other Ambulatory Visit: Payer: Self-pay | Admitting: Family

## 2022-10-17 ENCOUNTER — Telehealth: Payer: Self-pay

## 2022-10-17 ENCOUNTER — Ambulatory Visit: Payer: 59 | Admitting: Family

## 2022-10-17 VITALS — BP 103/75 | HR 90 | Wt 243.2 lb

## 2022-10-17 DIAGNOSIS — I10 Essential (primary) hypertension: Secondary | ICD-10-CM | POA: Diagnosis not present

## 2022-10-17 DIAGNOSIS — I2510 Atherosclerotic heart disease of native coronary artery without angina pectoris: Secondary | ICD-10-CM

## 2022-10-17 DIAGNOSIS — E1165 Type 2 diabetes mellitus with hyperglycemia: Secondary | ICD-10-CM | POA: Diagnosis not present

## 2022-10-17 DIAGNOSIS — G4733 Obstructive sleep apnea (adult) (pediatric): Secondary | ICD-10-CM | POA: Diagnosis not present

## 2022-10-17 DIAGNOSIS — I5022 Chronic systolic (congestive) heart failure: Secondary | ICD-10-CM | POA: Diagnosis present

## 2022-10-17 DIAGNOSIS — Z720 Tobacco use: Secondary | ICD-10-CM

## 2022-10-17 LAB — BASIC METABOLIC PANEL
Anion gap: 13 (ref 5–15)
BUN: 14 mg/dL (ref 6–20)
CO2: 29 mmol/L (ref 22–32)
Calcium: 8 mg/dL — ABNORMAL LOW (ref 8.9–10.3)
Chloride: 96 mmol/L — ABNORMAL LOW (ref 98–111)
Creatinine, Ser: 0.85 mg/dL (ref 0.61–1.24)
GFR, Estimated: >60 mL/min (ref >60)
Glucose, Bld: 183 mg/dL — ABNORMAL HIGH (ref 70–99)
Potassium: 3.3 mmol/L — ABNORMAL LOW (ref 3.5–5.1)
Sodium: 138 mmol/L (ref 135–145)

## 2022-10-17 LAB — BRAIN NATRIURETIC PEPTIDE: B Natriuretic Peptide: 483.5 pg/mL — ABNORMAL HIGH (ref 0.0–100.0)

## 2022-10-17 MED ORDER — SPIRONOLACTONE 25 MG PO TABS
25.0000 mg | ORAL_TABLET | Freq: Every day | ORAL | Status: DC
Start: 2022-10-17 — End: 2022-12-13

## 2022-10-17 MED ORDER — CARVEDILOL 3.125 MG PO TABS: 3.1250 mg | ORAL_TABLET | Freq: Two times a day (BID) | ORAL | 5 refills | Status: DC

## 2022-10-17 MED ORDER — POTASSIUM CHLORIDE CRYS ER 20 MEQ PO TBCR: 20.0000 meq | EXTENDED_RELEASE_TABLET | Freq: Every day | ORAL | 5 refills | Status: DC

## 2022-10-17 NOTE — Progress Notes (Signed)
   10/17/22 0915  ReDS Vest / Clip  Station Marker C  Ruler Value 34  ReDS Value Range (!) > 40  ReDS Actual Value 45

## 2022-10-17 NOTE — Patient Instructions (Signed)
START CARVEDILOL 3.125 MG TWICE DAILY  Go over to the [facility]. Go pass the gift shop and have your blood work completed.  FOLLOW UP IN 1 MONTH

## 2022-10-17 NOTE — Telephone Encounter (Signed)
-----   Message from Delma Freeze sent at 10/17/2022 10:28 AM EDT ----- Kidney function looks great. Potassium is a little low so start potassium once daily. Will recheck this at next visit

## 2022-10-17 NOTE — Progress Notes (Signed)
PCP: Darreld Mclean, MD (last seen few months ago) Primary Cardiologist: Dorothyann Peng, MD (last seen 04/24)   HPI:  Jesse [name] is a 52 y/o male with a history of CAD, DM2, hyperlipidemia, HTN, OSA, previous tobacco use and chronic heart failure. Reports CAD with NSTEMI in 2004 and 2007. Had inferior STEMI in 6/18. Cath with LAD 20% LCx 50%d. OM4 75% RCA 100%m  -> PCI with 2 overlapping DES stents  Was in the ED 03/19/22 due to SOB and weight gain. D dimer elevated so CTA done but no PE or pneumonia. Was in the ED 04/14/22 due to a/c HF. Ultrasound negative for DVT. Diuresed with improvement of symptoms. Was in the ED 05/30/22 due to a tick bite on his back. Found to be hypokalemic/ hypocalcemic. Given IV potassium, calcium gluconate and small fluid bolus.   Stress test 07/2020: Abnormal myocardial perfusion scan. Mildly depressed left ventricular function mild inferior hypoejection fraction 45% there is evidence of what appears to be fixed defect in the inferior segment but no evidence of reversibility to segment mid represent scar versus artifact.   Echo 08/15/20: EF of 45-50% Echo 04/17/22 EF 35-40% moderate LVH RV low normal.    He presents today for a HF f/u visit with a chief complaint of moderate fatigue with minimal exertion. Chronic in nature. Has associated minimal SOB (much better) and pedal edema (improving) along with this. Denies chest pain, cough, palpitations, abdominal distention, dizziness, difficulty sleeping or weight gain. Continues to wait on CPAP titration. Wife says that she reached back out to them and was told they are still waiting on some sort of insurance authorization.   At last visit, his spironolactone was increased to 25mg  daily. He has also been taking his torsemide as 40mg  BID this week "to keep things moving". Continues to work on decreasing his fluid intake but knows that he's still drinking too much fluids.   ROS: All systems negative except as listed in HPI, PMH  and Problem List.  SH:  Social History   Socioeconomic History   Marital status: Married    Spouse name: Not on file   Number of children: Not on file   Years of education: Not on file   Highest education level: Not on file  Occupational History   Not on file  Tobacco Use   Smoking status: Every Day    Current packs/day: 0.00    Average packs/day: 1 pack/day for 15.0 years (15.0 ttl pk-yrs)    Types: Cigars, Cigarettes    Start date: 2005    Last attempt to quit: 2020    Years since quitting: 4.7   Smokeless tobacco: Never  Vaping Use   Vaping status: Never Used  Substance and Sexual Activity   Alcohol use: No   Drug use: No    Comment: prior hx of cocaine and marijuana   Sexual activity: Not on file  Other Topics Concern   Not on file  Social History Narrative   Not on file   Social Determinants of Health   Financial Resource Strain: Not on file  Food Insecurity: Not on file  Transportation Needs: Not on file  Physical Activity: Not on file  Stress: Not on file  Social Connections: Not on file  Intimate Partner Violence: Not on file    FH:  Family History  Adopted: Yes    Past Medical History:  Diagnosis Date   CHF (congestive heart failure) (HCC)    Coronary artery disease  Inferior myocardial infarction in 2004.  He was treated with thrombolytics at [facility].  He then had an inferior MI in March 2007.  He received a Horizon study stent 3.5 x 20 mm in the distal RCA at [facility].  No other obstructive disease.   Diabetes mellitus    TYPE II   Erectile dysfunction    Hyperlipidemia    Hypertension    MI (myocardial infarction) (HCC) [id number]   Morbid obesity (HCC)    Sleep apnea     Current Outpatient Medications  Medication Sig Dispense Refill   AMBULATORY NON FORMULARY MEDICATION Trimix (30/1/10)-(Pap/Phent/PGE)  Test Dose  1ml vial   Qty #3 Refills 0  [facility] [phone] Fax [phone] (Patient not taking: Reported on  10/15/2022) 3 mL 0   atorvastatin (LIPITOR) 40 MG tablet Take 80 mg by mouth daily.      clopidogrel (PLAVIX) 75 MG tablet Take 75 mg by mouth daily.     Dulaglutide (TRULICITY) 1.5 MG/0.5ML SOPN Inject into the skin. (Patient not taking: Reported on 10/14/2022)     empagliflozin (JARDIANCE) 25 MG TABS tablet Take 25 mg by mouth daily.     lisinopril (PRINIVIL,ZESTRIL) 10 MG tablet Take 1 tablet (10 mg total) by mouth daily. (Patient not taking: Reported on 10/14/2022) 30 tablet 2   metoprolol succinate (TOPROL XL) 25 MG 24 hr tablet Take 1 tablet (25 mg total) by mouth daily. (Patient not taking: Reported on 10/14/2022) 90 tablet 3   nitroGLYCERIN (NITROSTAT) 0.4 MG SL tablet Place 1 tablet (0.4 mg total) under the tongue every 5 (five) minutes as needed for chest pain. 30 tablet 0   spironolactone (ALDACTONE) 25 MG tablet TAKE 1 TABLET (25 MG TOTAL) BY MOUTH DAILY. (Patient taking differently: Take 12.5 mg by mouth daily.) 90 tablet 1   torsemide (DEMADEX) 20 MG tablet Take 2 tablets (40 mg total) by mouth daily. 60 tablet 0   TRADJENTA 5 MG TABS tablet Take 5 mg by mouth daily.     traMADol (ULTRAM) 50 MG tablet Take 50 mg by mouth every 6 (six) hours as needed for pain.     No current facility-administered medications for this visit.   Vitals:   10/17/22 0852  BP: 103/75  Pulse: 90  SpO2: 100%  Weight: 243 lb 3.2 oz (110.3 kg)   Wt Readings from Last 3 Encounters:  10/17/22 243 lb 3.2 oz (110.3 kg)  10/15/22 250 lb (113.4 kg)  10/14/22 249 lb (112.9 kg)   Lab Results  Component Value Date   CREATININE 0.90 10/14/2022   CREATININE 0.52 (L) 05/30/2022   CREATININE 0.71 04/23/2022   PHYSICAL EXAM:  General:  Well appearing. No resp difficulty HEENT: normal Neck: supple. JVP flat. No lymphadenopathy or thryomegaly appreciated. Cor: PMI normal. Regular rate & rhythm. No rubs, gallops or murmurs. Lungs: clear Abdomen: soft, nontender, nondistended. No hepatosplenomegaly. No bruits  or masses.  Extremities: no cyanosis, clubbing, rash, 2+ pitting edema bilaterally  Neuro: alert & oriented x3, cranial nerves grossly intact. Moves all 4 extremities w/o difficulty. Affect pleasant.  ECG: not done  ReDs: 45%   ASSESSMENT & PLAN:  1: Chronic systolic HF due to ICM - NYHA class II - euvolemic by symptoms and exam - not weighing daily; encouraged to resume weighing so that he can call for an overnight weight gain of > 2 pounds or a weekly weight gain of > 5 pounds - weight down 7 pounds from last visit here 2 days  ago - ReDs 45% although feel like this reading is false based on improvement of symptoms/ weight; 2 days ago it was 38% - he says that he's decreased his fluid intake and will continue to work on it (was drinking ~ 160 oz soda daily) - not adding salt but eats salty foods like fast food especially when working - s/p Inferior STEMI in 6/18. Cath with LAD 20% LCx 50%d. OM4 75% RCA 100%m  -> PCI with 2 overlapping DES stents - Myoview 07/22: EF45% inferior scar. No ischemia  - Echo 08/15/20 EF 45-50% - Echo 04/17/22 EF 35-40% moderate LVH RV low normal - continue jardiance 25mg  daily (DM dose) - continue spironolactone 25mg  daily - continue torsemide 40mg  BID - BMP/BNP today - begin carvedilol 3.125mg  BID - continue holding lisinopril - still waiting on compression socks - BNP 10/14/22 was 534.9  2: HTN- - BP 103/75 - saw PCP Jesse [name]) few months ago - BMP 10/14/22 showed sodium 135, potassium 3.6, creatinine 0.90 & GFR>60 - BMP today  3. OSA- - high suspicion for OSA - sleep study has been done but he needs to return for CPAP titration; waiting on insurance approval  4. DM2 - A1c 7.3% in 3/24 - continue Jardiance 25mg  daily   5: CAD  - saw cardiology Jesse [name]) 04/24 - continue clopidogrel 75mg  daily - Cath 06/2016 with LAD 20% LCx 50%d. OM4 75% RCA 100%m  -> PCI with 2 overlapping DES stents  6. Tobacco use - smoking a pack of cigars per day -  encouraged cessation  Return in 1 month, sooner if needed.

## 2022-11-08 ENCOUNTER — Other Ambulatory Visit: Payer: Self-pay | Admitting: Family

## 2022-11-12 ENCOUNTER — Ambulatory Visit (HOSPITAL_BASED_OUTPATIENT_CLINIC_OR_DEPARTMENT_OTHER): Payer: 59 | Attending: Cardiology | Admitting: Cardiology

## 2022-11-12 VITALS — Ht 71.0 in | Wt 260.0 lb

## 2022-11-12 DIAGNOSIS — I5022 Chronic systolic (congestive) heart failure: Secondary | ICD-10-CM | POA: Diagnosis not present

## 2022-11-12 DIAGNOSIS — R0683 Snoring: Secondary | ICD-10-CM | POA: Insufficient documentation

## 2022-11-12 DIAGNOSIS — I493 Ventricular premature depolarization: Secondary | ICD-10-CM | POA: Insufficient documentation

## 2022-11-12 DIAGNOSIS — G4733 Obstructive sleep apnea (adult) (pediatric): Secondary | ICD-10-CM | POA: Diagnosis present

## 2022-11-12 DIAGNOSIS — I472 Ventricular tachycardia, unspecified: Secondary | ICD-10-CM | POA: Diagnosis not present

## 2022-11-12 DIAGNOSIS — I110 Hypertensive heart disease with heart failure: Secondary | ICD-10-CM | POA: Diagnosis not present

## 2022-11-13 NOTE — Procedures (Signed)
   Patient Name: Jesse [name], Jesse [name] Date: 11/12/2022 Gender: Male D.O.B: [birth year] Age (years): 68 Referring Provider: Armanda Magic MD, ABSM Height (inches): 71 Interpreting Physician: Armanda Magic MD, ABSM Weight (lbs): 260 RPSGT: Shelah Lewandowsky BMI: 36 MRN: [id number] Neck Size: 15.75  CLINICAL INFORMATION The patient is referred for a BiPAP titration to treat sleep apnea.  SLEEP STUDY TECHNIQUE As per the AASM Manual for the Scoring of Sleep and Associated Events v2.3 (April 2016) with a hypopnea requiring 4% desaturations.  The channels recorded and monitored were frontal, central and occipital EEG, electrooculogram (EOG), submentalis EMG (chin), nasal and oral airflow, thoracic and abdominal wall motion, anterior tibialis EMG, snore microphone, electrocardiogram, and pulse oximetry. Bilevel positive airway pressure (BPAP) was initiated at the beginning of the study and titrated to treat sleep-disordered breathing.  MEDICATIONS Medications self-administered by patient taken the night of the study : N/A  RESPIRATORY PARAMETERS Optimal IPAP Pressure (cm): 22  AHI at Optimal Pressure (/hr) 3.8 Optimal EPAP Pressure (cm):18  Overall Minimal O2 (%):84.0  Minimal O2 at Optimal Pressure (%): 93.0  SLEEP ARCHITECTURE Start Time:10:24:55 PM  Stop Time:5:00:03 AM  Total Time (min):395.1  Total Sleep Time (min):335.5 Sleep Latency (min):12.3  Sleep Efficiency (%):84.9%  REM Latency (min):56.5  WASO (min): 47.3 Stage N1 (%): 8.2%  Stage N2 (%): 76.2%  Stage N3 (%): 0.0%  Stage R (%):15.7 Supine (%):15.50  Arousal Index (/hr):9.7   CARDIAC DATA The 2 lead EKG demonstrated sinus rhythm. The mean heart rate was 77.7 beats per minute. Other EKG findings include: PVCs, nonsustained ventricular tacyhcardia (13 beats).  LEG MOVEMENT DATA The total Periodic Limb Movements of Sleep (PLMS) were 0. The PLMS index was 0.0. A PLMS index of <15 is considered normal in  adults.  IMPRESSIONS - An optimal PAP pressure was selected for this patient ( 23 / cm of water) - No significant Central Sleep Apnea was noted during this titration (CAI = 5.2/h). - Moderate oxygen desaturations were observed during this titration (min O2 = 84.0%). - The patient snored with moderate snoring volume. - 2-lead EKG demonstrated: PVCs and NSVT - Clinically significant periodic limb movements were not noted during this study. Arousals associated with PLMs were rare.  DIAGNOSIS - Obstructive Sleep Apnea (G47.33) Nonsustained ventricular tachycardia - PVCs  RECOMMENDATIONS - Trial of ResMed BiPAP therapy on 22/18 cm H2O with a Medium size Fisher&Paykel Full Face Simplus mask and heated humidification. - Avoid alcohol, sedatives and other CNS depressants that may worsen sleep apnea and disrupt normal sleep architecture. - Sleep hygiene should be reviewed to assess factors that may improve sleep quality. - Weight management and regular exercise should be initiated or continued. - Return to Sleep Center for re-evaluation after 4 weeks of therapy  [Electronically signed] 11/13/2022 02:05 PM  Armanda Magic MD, ABSM Diplomate, American Board of Sleep Medicine

## 2022-11-18 NOTE — Progress Notes (Unsigned)
PCP: Jesse Mclean, MD (last seen few months ago) Primary Cardiologist: Jesse Peng, MD (last seen 04/24)   HPI:  Jesse [name] is a 52 y/o male with a history of CAD, DM2, hyperlipidemia, HTN, OSA, previous tobacco use and chronic heart failure. Reports CAD with NSTEMI in 2004 and 2007. Had inferior STEMI in 6/18. Cath with LAD 20% LCx 50%d. OM4 75% RCA 100%m  -> PCI with 2 overlapping DES stents  Was in the ED 03/19/22 due to SOB and weight gain. D dimer elevated so CTA done but no PE or pneumonia. Was in the ED 04/14/22 due to a/c HF. Ultrasound negative for DVT. Diuresed with improvement of symptoms. Was in the ED 05/30/22 due to a tick bite on his back. Found to be hypokalemic/ hypocalcemic. Given IV potassium, calcium gluconate and small fluid bolus.   Stress test 07/2020: Abnormal myocardial perfusion scan. Mildly depressed left ventricular function mild inferior hypoejection fraction 45% there is evidence of what appears to be fixed defect in the inferior segment but no evidence of reversibility to segment mid represent scar versus artifact.   Echo 08/15/20: EF of 45-50% Echo 04/17/22 EF 35-40% moderate LVH RV low normal.    He presents today for a HF f/u visit with a chief complaint of moderate fatigue with minimal exertion. Chronic in nature. Has associated minimal SOB (much better) and pedal edema (improving) along with this. Denies chest pain, cough, palpitations, abdominal distention, dizziness, difficulty sleeping or weight gain. Contin  At last visit, carvedilol 3.125mg  BID was started along with potassium daily.   ROS: All systems negative except as listed in HPI, PMH and Problem List.  SH:  Social History   Socioeconomic History   Marital status: Married    Spouse name: Not on file   Number of children: Not on file   Years of education: Not on file   Highest education level: Not on file  Occupational History   Not on file  Tobacco Use   Smoking status: Every Day     Current packs/day: 0.00    Average packs/day: 1 pack/day for 15.0 years (15.0 ttl pk-yrs)    Types: Cigars, Cigarettes    Start date: 2005    Last attempt to quit: 2020    Years since quitting: 4.8   Smokeless tobacco: Never  Vaping Use   Vaping status: Never Used  Substance and Sexual Activity   Alcohol use: No   Drug use: No    Comment: prior hx of cocaine and marijuana   Sexual activity: Not on file  Other Topics Concern   Not on file  Social History Narrative   Not on file   Social Determinants of Health   Financial Resource Strain: Not on file  Food Insecurity: Not on file  Transportation Needs: Not on file  Physical Activity: Not on file  Stress: Not on file  Social Connections: Not on file  Intimate Partner Violence: Not on file    FH:  Family History  Adopted: Yes    Past Medical History:  Diagnosis Date   CHF (congestive heart failure) (HCC)    Coronary artery disease    Inferior myocardial infarction in 2004.  He was treated with thrombolytics at [facility].  He then had an inferior MI in March 2007.  He received a Horizon study stent 3.5 x 20 mm in the distal RCA at [facility].  No other obstructive disease.   Diabetes mellitus    TYPE II  Erectile dysfunction    Hyperlipidemia    Hypertension    MI (myocardial infarction) (HCC) 1610,9604   Morbid obesity (HCC)    Sleep apnea     Current Outpatient Medications  Medication Sig Dispense Refill   AMBULATORY NON FORMULARY MEDICATION Trimix (30/1/10)-(Pap/Phent/PGE)  Test Dose  1ml vial   Qty #3 Refills 0  [facility] [phone] Fax [phone] (Patient not taking: Reported on 10/15/2022) 3 mL 0   atorvastatin (LIPITOR) 40 MG tablet Take 80 mg by mouth daily.      carvedilol (COREG) 3.125 MG tablet Take 1 tablet (3.125 mg total) by mouth 2 (two) times daily. 60 tablet 5   clopidogrel (PLAVIX) 75 MG tablet Take 75 mg by mouth daily.     Dulaglutide (TRULICITY) 1.5 MG/0.5ML SOPN Inject into  the skin. (Patient not taking: Reported on 10/14/2022)     empagliflozin (JARDIANCE) 25 MG TABS tablet Take 25 mg by mouth daily.     lisinopril (PRINIVIL,ZESTRIL) 10 MG tablet Take 1 tablet (10 mg total) by mouth daily. (Patient not taking: Reported on 10/17/2022) 30 tablet 2   nitroGLYCERIN (NITROSTAT) 0.4 MG SL tablet Place 1 tablet (0.4 mg total) under the tongue every 5 (five) minutes as needed for chest pain. (Patient not taking: Reported on 10/17/2022) 30 tablet 0   potassium chloride SA (KLOR-CON M) 20 MEQ tablet Take 1 tablet (20 mEq total) by mouth daily. 30 tablet 5   spironolactone (ALDACTONE) 25 MG tablet Take 1 tablet (25 mg total) by mouth daily.     torsemide (DEMADEX) 20 MG tablet TAKE 2 TABLETS (40 MG TOTAL) BY MOUTH DAILY. 180 tablet 1   TRADJENTA 5 MG TABS tablet Take 5 mg by mouth daily.     traMADol (ULTRAM) 50 MG tablet Take 50 mg by mouth every 6 (six) hours as needed for pain.     No current facility-administered medications for this visit.     PHYSICAL EXAM:  General:  Well appearing. No resp difficulty HEENT: normal Neck: supple. JVP flat. No lymphadenopathy or thryomegaly appreciated. Cor: PMI normal. Regular rate & rhythm. No rubs, gallops or murmurs. Lungs: clear Abdomen: soft, nontender, nondistended. No hepatosplenomegaly. No bruits or masses.  Extremities: no cyanosis, clubbing, rash, 2+ pitting edema bilaterally  Neuro: alert & oriented x3, cranial nerves grossly intact. Moves all 4 extremities w/o difficulty. Affect pleasant.  ECG: not done     ASSESSMENT & PLAN:  1: Chronic systolic HF due to ICM - NYHA class II - euvolemic by symptoms and exam - not weighing daily; encouraged to resume weighing so that he can call for an overnight weight gain of > 2 pounds or a weekly weight gain of > 5 pounds - weight 243.2 pounds from last visit here 1 month ago  - he says that he's decreased his fluid intake and will continue to work on it (was drinking ~ 160  oz soda daily) - not adding salt but eats salty foods like fast food especially when working - s/p Inferior STEMI in 6/18. Cath with LAD 20% LCx 50%d. OM4 75% RCA 100%m  -> PCI with 2 overlapping DES stents - Myoview 07/22: EF45% inferior scar. No ischemia  - Echo 08/15/20 EF 45-50% - Echo 04/17/22 EF 35-40% moderate LVH RV low normal - continue jardiance 25mg  daily (DM dose) - continue spironolactone 25mg  daily - continue torsemide 40mg  BID/ potassium daily - begin carvedilol 3.125mg  BID - continue holding lisinopril - still waiting on compression socks -  BNP 10/17/22 was 483.5  2: HTN- - BP  - saw PCP Jesse [name])  - BMP 10/17/22 showed sodium 138, potassium 3.3, creatinine 0.85 & GFR>60  3. OSA- - high suspicion for OSA - sleep study has been done but he needs to return for CPAP titration; waiting on insurance approval  4. DM2 - A1c 7.3% in 3/24 - continue Jardiance 25mg  daily   5: CAD  - saw cardiology Jesse [name]) 04/24 - continue clopidogrel 75mg  daily - Cath 06/2016 with LAD 20% LCx 50%d. OM4 75% RCA 100%m  -> PCI with 2 overlapping DES stents  6. Tobacco use - smoking a pack of cigars per day - encouraged cessation

## 2022-11-19 ENCOUNTER — Ambulatory Visit (HOSPITAL_BASED_OUTPATIENT_CLINIC_OR_DEPARTMENT_OTHER): Payer: 59 | Admitting: Family

## 2022-11-19 ENCOUNTER — Other Ambulatory Visit
Admission: RE | Admit: 2022-11-19 | Discharge: 2022-11-19 | Disposition: A | Discharge status: Home / Self Care | Payer: 59 | Source: Ambulatory Visit | Attending: Family | Admitting: Family

## 2022-11-19 ENCOUNTER — Encounter: Payer: Self-pay | Admitting: Family

## 2022-11-19 ENCOUNTER — Other Ambulatory Visit: Payer: Self-pay | Admitting: Family

## 2022-11-19 ENCOUNTER — Other Ambulatory Visit: Payer: Self-pay | Admitting: *Deleted

## 2022-11-19 VITALS — BP 97/75 | HR 87 | Wt 260.0 lb

## 2022-11-19 DIAGNOSIS — I5022 Chronic systolic (congestive) heart failure: Secondary | ICD-10-CM

## 2022-11-19 DIAGNOSIS — G4733 Obstructive sleep apnea (adult) (pediatric): Secondary | ICD-10-CM | POA: Diagnosis not present

## 2022-11-19 DIAGNOSIS — R29898 Other symptoms and signs involving the musculoskeletal system: Secondary | ICD-10-CM

## 2022-11-19 DIAGNOSIS — I10 Essential (primary) hypertension: Secondary | ICD-10-CM

## 2022-11-19 DIAGNOSIS — E1165 Type 2 diabetes mellitus with hyperglycemia: Secondary | ICD-10-CM

## 2022-11-19 DIAGNOSIS — Z720 Tobacco use: Secondary | ICD-10-CM

## 2022-11-19 DIAGNOSIS — I2510 Atherosclerotic heart disease of native coronary artery without angina pectoris: Secondary | ICD-10-CM

## 2022-11-19 LAB — BASIC METABOLIC PANEL
Anion gap: 11 (ref 5–15)
BUN: 21 mg/dL — ABNORMAL HIGH (ref 6–20)
CO2: 26 mmol/L (ref 22–32)
Calcium: 8 mg/dL — ABNORMAL LOW (ref 8.9–10.3)
Chloride: 101 mmol/L (ref 98–111)
Creatinine, Ser: 0.92 mg/dL (ref 0.61–1.24)
GFR, Estimated: >60 mL/min (ref >60)
Glucose, Bld: 156 mg/dL — ABNORMAL HIGH (ref 70–99)
Potassium: 4.5 mmol/L (ref 3.5–5.1)
Sodium: 138 mmol/L (ref 135–145)

## 2022-11-19 LAB — CBC
HCT: 46.5 % (ref 39.0–52.0)
Hemoglobin: 15.5 g/dL (ref 13.0–17.0)
MCH: 28.2 pg (ref 26.0–34.0)
MCHC: 33.3 g/dL (ref 30.0–36.0)
MCV: 84.5 fL (ref 80.0–100.0)
Platelets: 466 10*3/uL — ABNORMAL HIGH (ref 150–400)
RBC: 5.5 MIL/uL (ref 4.22–5.81)
RDW: 16 % — ABNORMAL HIGH (ref 11.5–15.5)
WBC: 9.7 10*3/uL (ref 4.0–10.5)
nRBC: 0 % (ref 0.0–0.2)

## 2022-11-19 LAB — LIPID PANEL
Cholesterol: 228 mg/dL — ABNORMAL HIGH (ref 0–200)
HDL: 33 mg/dL — ABNORMAL LOW (ref >40)
LDL Cholesterol: 172 mg/dL — ABNORMAL HIGH (ref 0–99)
Total CHOL/HDL Ratio: 6.9 {ratio}
Triglycerides: 114 mg/dL (ref <150)
VLDL: 23 mg/dL (ref 0–40)

## 2022-11-19 MED ORDER — TORSEMIDE 20 MG PO TABS: 40.0000 mg | ORAL_TABLET | Freq: Two times a day (BID) | ORAL | Status: DC

## 2022-11-19 MED ORDER — POTASSIUM CHLORIDE CRYS ER 20 MEQ PO TBCR: 20.0000 meq | EXTENDED_RELEASE_TABLET | ORAL | Status: DC

## 2022-11-19 MED ORDER — METOPROLOL SUCCINATE ER 25 MG PO TB24: 25.0000 mg | ORAL_TABLET | Freq: Every day | ORAL | 2 refills | Status: DC

## 2022-11-19 MED ORDER — METOPROLOL SUCCINATE ER 25 MG PO TB24: 12.5000 mg | ORAL_TABLET | Freq: Every day | ORAL | 2 refills | Status: DC

## 2022-11-19 NOTE — Addendum Note (Signed)
Addended by: Jola Schmidt A on: 11/19/2022 12:28 PM   Modules accepted: Orders

## 2022-11-19 NOTE — Progress Notes (Signed)
   11/19/22 0939  ReDS Vest / Clip  Station Marker C  Ruler Value 30  ReDS Value Range (!) > 40  ReDS Actual Value 48

## 2022-11-19 NOTE — Patient Instructions (Signed)
STOP TAKING CARVEDILOL  START TAKING METOPROLOL SUCCINATE 12.5 MG ONCE DAILY  Go over to the [facility]. Go pass the gift shop and have your blood work completed.

## 2022-11-22 ENCOUNTER — Telehealth: Payer: Self-pay | Admitting: Cardiology

## 2022-11-22 NOTE — Telephone Encounter (Signed)
Pt spouse called in for sleep study results

## 2022-11-28 ENCOUNTER — Other Ambulatory Visit: Payer: Self-pay

## 2022-11-28 ENCOUNTER — Emergency Department
Admission: EM | Admit: 2022-11-28 | Discharge: 2022-11-28 | Discharge status: Against Medical Advice | Payer: 59 | Attending: Emergency Medicine | Admitting: Emergency Medicine

## 2022-11-28 DIAGNOSIS — R531 Weakness: Secondary | ICD-10-CM | POA: Insufficient documentation

## 2022-11-28 DIAGNOSIS — I509 Heart failure, unspecified: Secondary | ICD-10-CM | POA: Insufficient documentation

## 2022-11-28 DIAGNOSIS — I110 Hypertensive heart disease with heart failure: Secondary | ICD-10-CM | POA: Insufficient documentation

## 2022-11-28 DIAGNOSIS — K047 Periapical abscess without sinus: Secondary | ICD-10-CM | POA: Insufficient documentation

## 2022-11-28 DIAGNOSIS — D72829 Elevated white blood cell count, unspecified: Secondary | ICD-10-CM | POA: Insufficient documentation

## 2022-11-28 LAB — CBC WITH DIFFERENTIAL/PLATELET
Abs Immature Granulocytes: 0.06 10*3/uL (ref 0.00–0.07)
Basophils Absolute: 0.1 10*3/uL (ref 0.0–0.1)
Basophils Relative: 1 %
Eosinophils Absolute: 0.1 10*3/uL (ref 0.0–0.5)
Eosinophils Relative: 1 %
HCT: 45.7 % (ref 39.0–52.0)
Hemoglobin: 15.6 g/dL (ref 13.0–17.0)
Immature Granulocytes: 1 %
Lymphocytes Relative: 12 %
Lymphs Abs: 1.6 10*3/uL (ref 0.7–4.0)
MCH: 27.8 pg (ref 26.0–34.0)
MCHC: 34.1 g/dL (ref 30.0–36.0)
MCV: 81.5 fL (ref 80.0–100.0)
Monocytes Absolute: 1.3 10*3/uL — ABNORMAL HIGH (ref 0.1–1.0)
Monocytes Relative: 10 %
Neutro Abs: 10.1 10*3/uL — ABNORMAL HIGH (ref 1.7–7.7)
Neutrophils Relative %: 75 %
Platelets: 442 10*3/uL — ABNORMAL HIGH (ref 150–400)
RBC: 5.61 MIL/uL (ref 4.22–5.81)
RDW: 16 % — ABNORMAL HIGH (ref 11.5–15.5)
WBC: 13.2 10*3/uL — ABNORMAL HIGH (ref 4.0–10.5)
nRBC: 0 % (ref 0.0–0.2)

## 2022-11-28 LAB — BASIC METABOLIC PANEL
Anion gap: 10 (ref 5–15)
BUN: 24 mg/dL — ABNORMAL HIGH (ref 6–20)
CO2: 24 mmol/L (ref 22–32)
Calcium: 7.9 mg/dL — ABNORMAL LOW (ref 8.9–10.3)
Chloride: 96 mmol/L — ABNORMAL LOW (ref 98–111)
Creatinine, Ser: 0.9 mg/dL (ref 0.61–1.24)
GFR, Estimated: >60 mL/min (ref >60)
Glucose, Bld: 181 mg/dL — ABNORMAL HIGH (ref 70–99)
Potassium: 3.5 mmol/L (ref 3.5–5.1)
Sodium: 130 mmol/L — ABNORMAL LOW (ref 135–145)

## 2022-11-28 NOTE — Telephone Encounter (Signed)
Wife calling again for sleep results. Thanks

## 2022-11-28 NOTE — ED Triage Notes (Signed)
Pt c/o feeling weak and currently on antibiotic of a dental abscess. Pt reports CHF and being type 2 diabetic.

## 2022-11-28 NOTE — ED Notes (Signed)
Patient called out needing to use the restroom and requesting a warm blanket. This RN provided a urinal and a warm blanket. Patient denies other needs at this time.

## 2022-11-28 NOTE — ED Provider Notes (Signed)
[facility] Provider Note    Event Date/Time   First MD Initiated Contact with Patient 11/28/22 1816     (approximate)   History   Weakness   HPI  Jesse [name] is a 52 y.o. male past medical history significant for hypertension, heart failure, who presents to the emergency department for weakness.  Patient endorses generalized weakness and fatigue over the past day.  States that he has a known dental abscess and was evaluated by his dentist, started on antibiotics and ibuprofen.  Told that he would later need to have the tooth removed.  States that he had decreased p.o. intake for the past 3 days secondary to dental pain.  Denies any fever.  Denies nausea or vomiting at this time but does state that he had nausea earlier today.  Denies any falls or trauma.  Denies any chest pain.       Physical Exam   Triage Vital Signs: ED Triage Vitals  Encounter Vitals Group     BP 11/28/22 1451 118/84     Systolic BP Percentile --      Diastolic BP Percentile --      Pulse Rate 11/28/22 1451 100     Resp 11/28/22 1451 16     Temp 11/28/22 1451 98.1 F (36.7 C)     Temp Source 11/28/22 1451 Oral     SpO2 11/28/22 1451 98 %     Weight 11/28/22 1452 255 lb (115.7 kg)     Height 11/28/22 1452 5\' 11"  (1.803 m)     Head Circumference --      Peak Flow --      Pain Score 11/28/22 1452 8     Pain Loc --      Pain Education --      Exclude from Growth Chart --     Most recent vital signs: Vitals:   11/28/22 1451 11/28/22 1806  BP: 118/84 102/79  Pulse: 100 93  Resp: 16 16  Temp: 98.1 F (36.7 C)   SpO2: 98% 97%    Physical Exam Constitutional:      Appearance: He is well-developed.  HENT:     Mouth/Throat:   Eyes:     Conjunctiva/sclera: Conjunctivae normal.  Cardiovascular:     Rate and Rhythm: Regular rhythm.  Pulmonary:     Effort: No respiratory distress.  Musculoskeletal:     Cervical back: Normal range of motion.  Skin:    General:  Skin is warm.  Neurological:     Mental Status: He is alert. Mental status is at baseline.     IMPRESSION / MDM / ASSESSMENT AND PLAN / ED COURSE  I reviewed the triage vital signs and the nursing notes.  Differential diagnosis including dehydration, electrolyte abnormality, dental abscess   LABS (all labs ordered are listed, but only abnormal results are displayed) Labs interpreted as -    Labs Reviewed  CBC WITH DIFFERENTIAL/PLATELET - Abnormal; Notable for the following components:      Result Value   WBC 13.2 (*)    RDW 16.0 (*)    Platelets 442 (*)    Neutro Abs 10.1 (*)    Monocytes Absolute 1.3 (*)    All other components within normal limits  BASIC METABOLIC PANEL - Abnormal; Notable for the following components:   Sodium 130 (*)    Chloride 96 (*)    Glucose, Bld 181 (*)    BUN 24 (*)    Calcium  7.9 (*)    All other components within normal limits     MDM  On my arrival patient had lab work done in triage, mild leukocytosis.  Mild hyponatremia which is likely in the setting of dehydration from his decreased p.o. intake.  Patient's blood pressure was soft in the emergency department especially in the setting of his hypertension.  Likely secondary to decreased p.o. intake from his dehydration from dental pain.  Does have an obvious periapical abscess on exam.  Discussed CT scan of the face to evaluate for abscess, IV fluids and reevaluation.  Patient stated that he was tired of waiting and that he just wanted to go home.  Discussed the concerns of decreased blood pressure in the setting of hypertension and that he could continue to worsen.  Discussed the need to complete his workup.  Patient was adamant that he did not want to stay any longer.  Attempted to provide him with any discharge information.  Did discuss holding any of his antihypertensive medications.  Patient eloped from the emergency department prior to receiving his paperwork.     PROCEDURES:  Critical  Care performed: No  Procedures  Patient's presentation is most consistent with acute presentation with potential threat to life or bodily function.   MEDICATIONS ORDERED IN ED: Medications - No data to display  FINAL CLINICAL IMPRESSION(S) / ED DIAGNOSES   Final diagnoses:  Weakness  Dental abscess     Rx / DC Orders   ED Discharge Orders     None        Note:  This document was prepared using Dragon voice recognition software and may include unintentional dictation errors.   Corena Herter, MD 11/29/22 [phone]

## 2022-11-28 NOTE — Discharge Instructions (Signed)
You are seen in the emergency department for weakness.  Concerned that you are dehydrated and that you may have a large dental abscess.  It was recommended that you get IV fluids and a CT scan of your face to look for the size of abscess.  Concern for your mildly low blood pressure today and that this could be a side effect of a bad infection.  It was recommended that you stay in the emergency department to complete your workup however you wanted to leave without further workup or any IV fluids.   Do not take your blood pressure medications until you can follow-up with your primary care physician for recheck your blood pressure and are feeling better and no longer weak.  Stay hydrated and drink plenty of fluids.  Follow-up closely with your dentist.  Return to the emergency department if you decide to complete your workup at any time.

## 2022-11-28 NOTE — ED Notes (Signed)
Patient states he is "about to leave because I am tired of waiting and I have been here all day". Patient advised by this RN that he should not leave and if he did, it would be against medical advice. Patient verbalized understanding.

## 2022-11-30 ENCOUNTER — Other Ambulatory Visit: Payer: Self-pay

## 2022-11-30 ENCOUNTER — Emergency Department
Admission: EM | Admit: 2022-11-30 | Discharge: 2022-11-30 | Disposition: A | Discharge status: Home / Self Care | Payer: 59 | Attending: Emergency Medicine | Admitting: Emergency Medicine

## 2022-11-30 DIAGNOSIS — I110 Hypertensive heart disease with heart failure: Secondary | ICD-10-CM | POA: Diagnosis not present

## 2022-11-30 DIAGNOSIS — K0889 Other specified disorders of teeth and supporting structures: Secondary | ICD-10-CM | POA: Diagnosis present

## 2022-11-30 DIAGNOSIS — E119 Type 2 diabetes mellitus without complications: Secondary | ICD-10-CM | POA: Insufficient documentation

## 2022-11-30 DIAGNOSIS — I2510 Atherosclerotic heart disease of native coronary artery without angina pectoris: Secondary | ICD-10-CM | POA: Diagnosis not present

## 2022-11-30 DIAGNOSIS — I509 Heart failure, unspecified: Secondary | ICD-10-CM | POA: Insufficient documentation

## 2022-11-30 NOTE — Discharge Instructions (Addendum)
Please continue to take the antibiotics as prescribed.  Try treating her pain first with the at 800 mg of ibuprofen.  This can be taken every 8 hours.  If you are unable to get your pain under control using ibuprofen you can take the tramadol every 6 hours.  Keep in mind that tramadol can be sedating so you should not drive after taking it.  OPTIONS FOR DENTAL FOLLOW UP CARE  [place] [facility] and [facility] - Local Safety Net Dental Clinics [email or web address]   [facility] [phone])  [facility] [phone])  [place] [phone] ext [phone])  [facility] [phone])  [facility] [phone]) This clinic caters to the indigent population and is on a lottery system. Location: [employer] [facility], [employer], [address] [place] Clinic Hours: Wednesdays from 6pm - 9pm, patients seen by a lottery system. For dates, call or go to [email or web address] Services: Cleanings, fillings and simple extractions. Payment Options: DENTAL WORK IS FREE OF CHARGE. Bring proof of income or support. Best way to get seen: Arrive at 5:15 pm - this is a lottery, NOT first come/first serve, so arriving earlier will not increase your chances of being seen.     [facility] [phone] Select option 1 for emergencies   Location: [facility], [place], [address], [place] Clinic Hours: No walk-ins accepted - call the day before to schedule an appointment. Check in times are 9:30 am and 1:30 pm. Services: Simple extractions, temporary fillings, pulpectomy/pulp debridement, uncomplicated abscess drainage. Payment Options: PAYMENT IS DUE AT THE TIME OF SERVICE.  Fee is usually $100-200, additional surgical procedures (e.g. abscess drainage) may be extra. Cash, checks, Visa/MasterCard  accepted.  Can file Medicaid if patient is covered for dental - patient should call case worker to check. No discount for [facility] patients. Best way to get seen: MUST call the day before and get onto the schedule. Can usually be seen the next 1-2 days. No walk-ins accepted.     [facility] [phone]   Location: [facility], [address] [place] Clinic Hours: M, W, Th, F 8am or 1:30pm, Tues 9a or 1:30 - first come/first served. Services: Simple extractions, temporary fillings, uncomplicated abscess drainage.  You do not need to be an [facility] resident. Payment Options: PAYMENT IS DUE AT THE TIME OF SERVICE. Dental insurance, otherwise sliding scale - bring proof of income or support. Depending on income and treatment needed, cost is usually $50-200. Best way to get seen: Arrive early as it is first come/first served.     [facility] [phone]   Location: [address] Clinic Hours: Mon-Thu 8a-5p Services: Most basic dental services including extractions and fillings. Payment Options: PAYMENT IS DUE AT THE TIME OF SERVICE. Sliding scale, up to 50% off - bring proof if income or support. Medicaid with dental option accepted. Best way to get seen: Call to schedule an appointment, can usually be seen within 2 weeks OR they will try to see walk-ins - show up at 8a or 2p (you may have to wait).     [facility] [phone] [place] RESIDENTS ONLY   Location: [facility], [address] [place], [place] [address] Clinic Hours: By appointment only. Monday - Thursday 8am-5pm, Friday 8am-12pm Services: Cleanings, fillings, extractions. Payment Options: PAYMENT IS DUE AT THE TIME OF SERVICE.  Cash, Visa or MasterCard. Sliding scale - $30 minimum per service. Best way to get seen: Come in to office, complete packet and make an  appointment - need proof of income or support monies for each household member and proof of [facility] residence. Usually takes about a month to get in.     [facility] [phone]   Location: [address], [facility] Clinic Hours: Walk-in Urgent Care Dental Services are offered Monday-Friday mornings only. The numbers of emergencies accepted daily is limited to the number of providers available. Maximum 15 - Mondays, Wednesdays & Thursdays Maximum 10 - Tuesdays & Fridays Services: You do not need to be a [facility] resident to be seen for a dental emergency. Emergencies are defined as pain, swelling, abnormal bleeding, or dental trauma. Walkins will receive x-rays if needed. NOTE: Dental cleaning is not an emergency. Payment Options: PAYMENT IS DUE AT THE TIME OF SERVICE. Minimum co-pay is $40.00 for uninsured patients. Minimum co-pay is $3.00 for Medicaid with dental coverage. Dental Insurance is accepted and must be presented at time of visit. Medicare does not cover dental. Forms of payment: Cash, credit card, checks. Best way to get seen: If not previously registered with the clinic, walk-in dental registration begins at 7:15 am and is on a first come/first serve basis. If previously registered with the clinic, call to make an appointment.     [facility] [phone] [place] RESIDENTS ONLY   Location: [address] [place], [place] Clinic Hours: Mon-Thu 10a-2p Services: Extractions only! Payment Options: FREE (donations accepted) - bring proof of income or support Best way to get seen: Call and schedule an appointment OR come at 8am on the 1st Monday of every month (except for holidays) when it is first come/first served.     [facility] [phone]   Location: [address] [place], [place] Clinic Hours: Friday mornings Services, Payment Options, Best way to get seen: Call for info

## 2022-11-30 NOTE — ED Provider Notes (Signed)
[facility] Provider Note    Event Date/Time   First MD Initiated Contact with Patient 11/30/22 2037     (approximate)   History   Dental Pain   HPI  Jesse [name] is a 52 y.o. male with PMH of MI, CAD, CHF, HTN, diabetes, obesity and sleep apnea presents for evaluation of dental pain.  Patient was seen by this ED 2 days ago and eloped due to wait time.  Patient was seen by his dental provider and was started on clindamycin, which she has been compliant with.  Patient reports that right now he is not in any pain but states that sometimes the pain can be severe.  He describes periods where his face is swollen as a result of the pain.  He denies any fevers.      Physical Exam   Triage Vital Signs: ED Triage Vitals [11/30/22 1939]  Encounter Vitals Group     BP 110/83     Systolic BP Percentile      Diastolic BP Percentile      Pulse Rate 94     Resp 14     Temp 98 F (36.7 C)     Temp Source Oral     SpO2 98 %     Weight 253 lb 8.5 oz (115 kg)     Height 5\' 11"  (1.803 m)     Head Circumference      Peak Flow      Pain Score 6     Pain Loc      Pain Education      Exclude from Growth Chart     Most recent vital signs: Vitals:   11/30/22 2043 11/30/22 2044  BP:  101/72  Pulse:  86  Resp:  18  Temp:    SpO2: 91% 100%    General: Awake, no distress.  CV:  Good peripheral perfusion.  Resp:  Normal effort.  Abd:  No distention.  Other:  Poor dentition throughout the mouth, at the tooth that patient is referring to, there is some necrosis of the tooth at the gumline, tender to palpation and percussion but no draining abscess.   ED Results / Procedures / Treatments   Labs (all labs ordered are listed, but only abnormal results are displayed) Labs Reviewed - No data to display   PROCEDURES:  Critical Care performed: No  Procedures   MEDICATIONS ORDERED IN ED: Medications - No data to display   IMPRESSION / MDM /  ASSESSMENT AND PLAN / ED COURSE  I reviewed the triage vital signs and the nursing notes.                             52 year old male presents for evaluation of dental pain.  Vital signs are stable and patient NAD on exam.  Differential diagnosis includes, but is not limited to, dental abscess, pulpitis, Ludwig's angina, gingivitis.  Patient's presentation is most consistent with acute, uncomplicated illness.  I reviewed Dr. Jefferey Pica note from his visit 2 days ago.  Based on what she documented in her physical exam and what I am seeing today it appears that patient is improving.  There is no draining abscess so I feel he is responding well to the antibiotics.  He reports his pain is well-controlled with ibuprofen and tramadol.  I did offer to refill his tramadol prescription but upon review of the PDMP, I do not  believe he is due for a refill.  Patient was okay with this.  I will also include a list of some local dental clinics as he wanted to get a second opinion.  He was given a work note.  Patient was agreeable to plan, voiced understanding and was stable at discharge.      FINAL CLINICAL IMPRESSION(S) / ED DIAGNOSES   Final diagnoses:  Pain, dental     Rx / DC Orders   ED Discharge Orders     None        Note:  This document was prepared using Dragon voice recognition software and may include unintentional dictation errors.   Cameron Ali, PA-C 11/30/22 2245    Minna Antis, MD 11/30/22 2253

## 2022-11-30 NOTE — ED Triage Notes (Addendum)
Pt to ed from home via POV for dental pain. Pt was just seen for same on 11/7 (two days ago). Pt is on an antibiotic for dental infection. Has follow up apt with dentist on 11/28. Pt is caox4, in no acute distress and ambulatory in triage. Pt left AMA two days ago bc he "didn't want to wait any longer".

## 2022-11-30 NOTE — ED Notes (Signed)
Pt states that he came in for weakness and mid CP.  "I had weird pains in it today." Denies CP and dental pain at the moment. States intermittent for both.   Dental pain is right sided.  Pt remains on Clindamycin. States BP is "weirdly low."

## 2022-12-02 ENCOUNTER — Telehealth: Payer: Self-pay | Admitting: *Deleted

## 2022-12-02 NOTE — Telephone Encounter (Signed)
The patient has been notified of the result and verbalized understanding.  All questions (if any) were answered. Latrelle Dodrill, CMA 12/02/2022 3:07 PM

## 2022-12-02 NOTE — Telephone Encounter (Signed)
-----   Message from Armanda Magic sent at 11/13/2022  2:06 PM EDT ----- Please let patient know that they had a successful PAP titration and let DME know that orders are in EPIC.  Please set up 6 week OV with me.

## 2022-12-02 NOTE — Telephone Encounter (Signed)
The patient has been notified of the result and verbalized understanding.  All questions (if any) were answered. Latrelle Dodrill, CMA 12/02/2022 3:07 PM    Upon patient request DME selection is [facility]. Patient understands he will be contacted by [facility] to set up his cpap. Patient understands to call if [facility] does not contact him with new setup in a timely manner. Patient understands they will be called once confirmation has been received from [facility]/ that they have received their new machine to schedule 10 week follow up appointment.   [facility] notified of new cpap order  Please add to airview Patient was grateful for the call and thanked me.

## 2022-12-10 ENCOUNTER — Ambulatory Visit: Payer: 59 | Attending: Family | Admitting: Family

## 2022-12-10 ENCOUNTER — Encounter: Payer: Self-pay | Admitting: Family

## 2022-12-10 VITALS — BP 106/77 | HR 90 | Ht 71.0 in | Wt 267.0 lb

## 2022-12-10 DIAGNOSIS — E785 Hyperlipidemia, unspecified: Secondary | ICD-10-CM | POA: Diagnosis present

## 2022-12-10 DIAGNOSIS — R29898 Other symptoms and signs involving the musculoskeletal system: Secondary | ICD-10-CM

## 2022-12-10 DIAGNOSIS — I255 Ischemic cardiomyopathy: Secondary | ICD-10-CM | POA: Diagnosis not present

## 2022-12-10 DIAGNOSIS — Z955 Presence of coronary angioplasty implant and graft: Secondary | ICD-10-CM | POA: Insufficient documentation

## 2022-12-10 DIAGNOSIS — I5022 Chronic systolic (congestive) heart failure: Secondary | ICD-10-CM | POA: Insufficient documentation

## 2022-12-10 DIAGNOSIS — E1165 Type 2 diabetes mellitus with hyperglycemia: Secondary | ICD-10-CM | POA: Diagnosis not present

## 2022-12-10 DIAGNOSIS — G4733 Obstructive sleep apnea (adult) (pediatric): Secondary | ICD-10-CM | POA: Diagnosis not present

## 2022-12-10 DIAGNOSIS — Z79899 Other long term (current) drug therapy: Secondary | ICD-10-CM | POA: Insufficient documentation

## 2022-12-10 DIAGNOSIS — M6281 Muscle weakness (generalized): Secondary | ICD-10-CM | POA: Insufficient documentation

## 2022-12-10 DIAGNOSIS — F17290 Nicotine dependence, other tobacco product, uncomplicated: Secondary | ICD-10-CM | POA: Diagnosis not present

## 2022-12-10 DIAGNOSIS — I110 Hypertensive heart disease with heart failure: Secondary | ICD-10-CM | POA: Diagnosis not present

## 2022-12-10 DIAGNOSIS — Z720 Tobacco use: Secondary | ICD-10-CM

## 2022-12-10 DIAGNOSIS — I10 Essential (primary) hypertension: Secondary | ICD-10-CM

## 2022-12-10 DIAGNOSIS — I2510 Atherosclerotic heart disease of native coronary artery without angina pectoris: Secondary | ICD-10-CM | POA: Diagnosis present

## 2022-12-10 DIAGNOSIS — I252 Old myocardial infarction: Secondary | ICD-10-CM | POA: Insufficient documentation

## 2022-12-10 DIAGNOSIS — E119 Type 2 diabetes mellitus without complications: Secondary | ICD-10-CM | POA: Diagnosis not present

## 2022-12-10 NOTE — Progress Notes (Signed)
PCP: Darreld Mclean, MD (last seen few months ago) Primary Cardiologist: Dorothyann Peng, MD (last seen 11/24; returns 02/25)   HPI:  Jesse [name] is a 52 y/o male with a history of CAD (MI 2004/ 2007), DM2, hyperlipidemia, HTN, OSA, previous tobacco use and chronic heart failure. Reports CAD with NSTEMI in 2004 and 2007. Had inferior STEMI in 6/18. Cath with LAD 20% LCx 50%d. OM4 75% RCA 100%m  -> PCI with 2 overlapping DES stents  Cardiac history dates back to 2004. Prior inferior MI in 2004 treated with thrombolytics and MI in 2007 treated with stenting of the RCA. Normal EF at that time. He was apparently placed on full disability at the time of one of these MIs for unclear reasons.   Was in the ED 03/19/22 due to SOB and weight gain. D dimer elevated so CTA done but no PE or pneumonia. Was in the ED 04/14/22 due to a/c HF. Ultrasound negative for DVT. Diuresed with improvement of symptoms. Was in the ED 05/30/22 due to a tick bite on his back. Found to be hypokalemic/ hypocalcemic. Given IV potassium, calcium gluconate and small fluid bolus.   Stress test 07/2020: Abnormal myocardial perfusion scan. Mildly depressed left ventricular function mild inferior hypoejection fraction 45% there is evidence of what appears to be fixed defect in the inferior segment but no evidence of reversibility to segment mid represent scar versus artifact.   Echo 09/27/08: EF 60-65% with mild LAE Echo 07/05/16: EF 50-55% Echo 08/15/20: EF 45-50% Echo 04/17/22 EF 35-40% moderate LVH RV low normal.   LHC 07/05/16: Mid RCA to Dist RCA lesion, 100 %stenosed. RPDA lesion, 90 %stenosed. Dist Cx lesion, 50 %stenosed. 4th Mrg lesion, 75 %stenosed. Mid LAD to Dist LAD lesion, 20 %stenosed. A STENT XIENCE ALPINE RX [id number] drug eluting stent was successfully placed, and overlaps previously placed stent. A STENT XIENCE ALPINE RX [id number] stent was successfully placed. Dist RCA lesion, 100 %stenosed. Post intervention, there is a 0%  residual stenosis. There is mild left ventricular systolic dysfunction. LV end diastolic pressure is normal. The left ventricular ejection fraction is 45-50% by visual estimate.  Conclusion STEMI inferior wall Successful PCI stent to mid RCA deployment of 2 overlapping DES stents reducing lesion from 90% down to 0 Diagnostic cardiac catheter of the left system which showed mild to moderate diffuse disease   He presents today for a HF f/u visit with a chief complaint of moderate shortness of breath with minimal exertion. Chronic in nature although a little worse today as he drank 4 yoo-hoos within an hour yesterday evening. Has associated moderate fatigue, pedal edema and continued numbness in a few of his fingers in right hand along with leg weakness for several weeks. He's had a couple of falls due to either his leg giving out or the inability to pick his foot up far enough to get up a step. Denies chest pain, cough, palpitations or dizziness.    At last visit, betablocker was changed to metoprolol 12.5mg  daily and he feels like he's tolerating this without any difficulty. Saw cardiology earlier today and his lipitor has been stopped to see if leg weakness improves. Plan to start crestor 01/25. Says that he will be having cMRI scheduled.   ROS: All systems negative except as listed in HPI, PMH and Problem List.  SH:  Social History   Socioeconomic History   Marital status: Married    Spouse name: Not on file   Number of children: Not on  file   Years of education: Not on file   Highest education level: Not on file  Occupational History   Not on file  Tobacco Use   Smoking status: Every Day    Current packs/day: 0.00    Average packs/day: 1 pack/day for 15.0 years (15.0 ttl pk-yrs)    Types: Cigars, Cigarettes    Start date: 2005    Last attempt to quit: 2020    Years since quitting: 4.8   Smokeless tobacco: Never  Vaping Use   Vaping status: Never Used  Substance and Sexual  Activity   Alcohol use: No   Drug use: No    Comment: prior hx of cocaine and marijuana   Sexual activity: Not on file  Other Topics Concern   Not on file  Social History Narrative   Not on file   Social Determinants of Health   Financial Resource Strain: Not on file  Food Insecurity: Not on file  Transportation Needs: Not on file  Physical Activity: Not on file  Stress: Not on file  Social Connections: Not on file  Intimate Partner Violence: Not on file    FH:  Family History  Adopted: Yes    Past Medical History:  Diagnosis Date   CHF (congestive heart failure) (HCC)    Coronary artery disease    Inferior myocardial infarction in 2004.  He was treated with thrombolytics at [facility].  He then had an inferior MI in March 2007.  He received a Horizon study stent 3.5 x 20 mm in the distal RCA at [facility].  No other obstructive disease.   Diabetes mellitus    TYPE II   Erectile dysfunction    Hyperlipidemia    Hypertension    MI (myocardial infarction) (HCC) 8295,6213   Morbid obesity (HCC)    Sleep apnea     Current Outpatient Medications  Medication Sig Dispense Refill   AMBULATORY NON FORMULARY MEDICATION Trimix (30/1/10)-(Pap/Phent/PGE)  Test Dose  1ml vial   Qty #3 Refills 0  [facility] [phone] Fax [phone] 3 mL 0   atorvastatin (LIPITOR) 40 MG tablet Take 80 mg by mouth daily.      clopidogrel (PLAVIX) 75 MG tablet Take 75 mg by mouth daily.     Dulaglutide (TRULICITY) 1.5 MG/0.5ML SOPN Inject into the skin.     empagliflozin (JARDIANCE) 25 MG TABS tablet Take 25 mg by mouth daily.     metoprolol succinate (TOPROL-XL) 25 MG 24 hr tablet Take 0.5 tablets (12.5 mg total) by mouth daily. Take with or immediately following a meal. 45 tablet 2   nitroGLYCERIN (NITROSTAT) 0.4 MG SL tablet Place 1 tablet (0.4 mg total) under the tongue every 5 (five) minutes as needed for chest pain. 30 tablet 0   potassium chloride SA (KLOR-CON M) 20 MEQ tablet  Take 1 tablet (20 mEq total) by mouth 3 (three) times a week.     spironolactone (ALDACTONE) 25 MG tablet Take 1 tablet (25 mg total) by mouth daily.     torsemide (DEMADEX) 20 MG tablet Take 2 tablets (40 mg total) by mouth 2 (two) times daily.     TRADJENTA 5 MG TABS tablet Take 5 mg by mouth daily.     traMADol (ULTRAM) 50 MG tablet Take 50 mg by mouth every 6 (six) hours as needed for pain.     No current facility-administered medications for this visit.   Vitals:   12/10/22 1421  BP: 106/77  Pulse: 90  SpO2: 100%  Weight: 267 lb (121.1 kg)  Height: 5\' 11"  (1.803 m)   Wt Readings from Last 3 Encounters:  12/10/22 267 lb (121.1 kg)  11/30/22 253 lb 8.5 oz (115 kg)  11/28/22 255 lb (115.7 kg)   Lab Results  Component Value Date   CREATININE 0.90 11/28/2022   CREATININE 0.92 11/19/2022   CREATININE 0.85 10/17/2022   PHYSICAL EXAM:  General:  Well appearing. No resp difficulty HEENT: normal Neck: supple. JVP flat. No lymphadenopathy or thryomegaly appreciated. Cor: PMI normal. Regular rate & rhythm. No rubs, gallops or murmurs. Lungs: clear Abdomen: soft, nontender, nondistended. No hepatosplenomegaly. No bruits or masses.  Extremities: no cyanosis, clubbing, rash, 2+ pitting edema bilaterally  Neuro: alert & oriented x3, cranial nerves grossly intact. Moves all 4 extremities w/o difficulty. Affect pleasant.  ECG: not done  Reds: unable to obtain   ASSESSMENT & PLAN:  1: Chronic systolic HF due to ICM - NYHA class III - mildly fluid overloaded but stable - not weighing daily; encouraged to resume weighing so that he can call for an overnight weight gain of > 2 pounds or a weekly weight gain of > 5 pounds - weight up 7 pounds from last visit here 3 weeks ago - trying to keep daily fluid intake to around 60-64 oz but did drink "too much" fluids yesterday evening and began to feel bloated afterwards - not adding salt but eats salty foods like fast food especially when  working - s/p Inferior STEMI in 6/18. Cath with LAD 20% LCx 50%d. OM4 75% RCA 100%m  -> PCI with 2 overlapping DES stents - Myoview 07/22: EF45% inferior scar. No ischemia  - Echo 09/27/08: EF 60-65% with mild LAE - Echo 07/05/16: EF 50-55% - Echo 08/15/20 EF 45-50% - Echo 04/17/22 EF 35-40% moderate LVH RV low normal - continue jardiance 25mg  daily (DM dose) - continue torsemide 40mg  1-2 times/ day - continue potassium 3 x / week - continue metoprolol succinate 12.5mg  daily  - continue holding lisinopril due to BP - no longer on spironolactone due to BP - wearing compression socks daily - referral made for cardiac rehab @ last visit and he is still waiting to hear from them - says that cardiology discussed doing cMRI - BNP 10/17/22 was 483.5  2: HTN- - BP 106/77 - sees PCP Marvis Moeller)  - BMP 11/28/22 showed sodium 130, potassium 3.5, creatinine 0.9 & GFR>60  3. OSA- - CPAP titration has been completed and he's now waiting on equipment to be delivered - lowest O2 sat was 84.0% - he says that he's going to call [facility] regarding equipment  4. DM2 - A1c 7.3% in 3/24 (primary care) - continue Jardiance 25mg  daily   5: CAD  - saw cardiology Juliann Pares) 11/24 - continue clopidogrel 75mg  daily - lipitor has been stopped and will start crestor 01/25 - LDL 11/19/22 was 172 - Cath 06/2016 with LAD 20% LCx 50%d. OM4 75% RCA 100%m  -> PCI with 2 overlapping DES stents  6. Tobacco use - smoking 5-6 cigars per day - encouraged cessation  7: Leg weakness- - sees vascular (Schnier) 12/24 - lipitor has been stopped and will start crestor 01/25  Return in 1 month, sooner if needed.

## 2022-12-13 ENCOUNTER — Encounter: Payer: Self-pay | Admitting: Family

## 2022-12-13 ENCOUNTER — Other Ambulatory Visit: Payer: Self-pay | Admitting: Family

## 2022-12-13 DIAGNOSIS — I5022 Chronic systolic (congestive) heart failure: Secondary | ICD-10-CM

## 2022-12-13 MED ORDER — SPIRONOLACTONE 25 MG PO TABS: 25.0000 mg | ORAL_TABLET | Freq: Every day | ORAL | 5 refills | Status: DC

## 2022-12-16 ENCOUNTER — Other Ambulatory Visit: Payer: Self-pay | Admitting: Internal Medicine

## 2022-12-16 ENCOUNTER — Encounter: Payer: 59 | Admitting: Family

## 2022-12-16 DIAGNOSIS — I429 Cardiomyopathy, unspecified: Secondary | ICD-10-CM

## 2022-12-16 DIAGNOSIS — I5023 Acute on chronic systolic (congestive) heart failure: Secondary | ICD-10-CM

## 2022-12-16 DIAGNOSIS — I2510 Atherosclerotic heart disease of native coronary artery without angina pectoris: Secondary | ICD-10-CM

## 2022-12-17 ENCOUNTER — Ambulatory Visit: Payer: 59 | Attending: Family | Admitting: Family

## 2022-12-17 ENCOUNTER — Encounter: Payer: Self-pay | Admitting: Family

## 2022-12-17 VITALS — BP 103/79 | HR 87 | Ht 71.0 in | Wt 267.0 lb

## 2022-12-17 DIAGNOSIS — I255 Ischemic cardiomyopathy: Secondary | ICD-10-CM | POA: Insufficient documentation

## 2022-12-17 DIAGNOSIS — I252 Old myocardial infarction: Secondary | ICD-10-CM | POA: Diagnosis present

## 2022-12-17 DIAGNOSIS — I2510 Atherosclerotic heart disease of native coronary artery without angina pectoris: Secondary | ICD-10-CM | POA: Insufficient documentation

## 2022-12-17 DIAGNOSIS — I10 Essential (primary) hypertension: Secondary | ICD-10-CM

## 2022-12-17 DIAGNOSIS — I5022 Chronic systolic (congestive) heart failure: Secondary | ICD-10-CM | POA: Insufficient documentation

## 2022-12-17 DIAGNOSIS — I110 Hypertensive heart disease with heart failure: Secondary | ICD-10-CM | POA: Diagnosis not present

## 2022-12-17 DIAGNOSIS — Z7902 Long term (current) use of antithrombotics/antiplatelets: Secondary | ICD-10-CM | POA: Insufficient documentation

## 2022-12-17 DIAGNOSIS — M6281 Muscle weakness (generalized): Secondary | ICD-10-CM | POA: Insufficient documentation

## 2022-12-17 DIAGNOSIS — Z7984 Long term (current) use of oral hypoglycemic drugs: Secondary | ICD-10-CM | POA: Insufficient documentation

## 2022-12-17 DIAGNOSIS — Z955 Presence of coronary angioplasty implant and graft: Secondary | ICD-10-CM | POA: Insufficient documentation

## 2022-12-17 DIAGNOSIS — G4733 Obstructive sleep apnea (adult) (pediatric): Secondary | ICD-10-CM | POA: Insufficient documentation

## 2022-12-17 DIAGNOSIS — E785 Hyperlipidemia, unspecified: Secondary | ICD-10-CM | POA: Diagnosis present

## 2022-12-17 DIAGNOSIS — R29898 Other symptoms and signs involving the musculoskeletal system: Secondary | ICD-10-CM

## 2022-12-17 DIAGNOSIS — E1165 Type 2 diabetes mellitus with hyperglycemia: Secondary | ICD-10-CM | POA: Diagnosis not present

## 2022-12-17 DIAGNOSIS — F17290 Nicotine dependence, other tobacco product, uncomplicated: Secondary | ICD-10-CM | POA: Insufficient documentation

## 2022-12-17 DIAGNOSIS — Z720 Tobacco use: Secondary | ICD-10-CM

## 2022-12-17 DIAGNOSIS — E119 Type 2 diabetes mellitus without complications: Secondary | ICD-10-CM | POA: Diagnosis not present

## 2022-12-17 NOTE — Progress Notes (Signed)
PCP: Darreld Mclean, MD (last seen few months ago) Primary Cardiologist: Dorothyann Peng, MD (last seen 11/24; returns 02/25)   HPI:  Jesse [name] is a 52 y/o male with a history of CAD (MI 2004/ 2007), DM2, hyperlipidemia, HTN, OSA, previous tobacco use and chronic heart failure. Reports CAD with NSTEMI in 2004 and 2007. Had inferior STEMI in 6/18. Cath with LAD 20% LCx 50%d. OM4 75% RCA 100%m  -> PCI with 2 overlapping DES stents  Cardiac history dates back to 2004. Prior inferior MI in 2004 treated with thrombolytics and MI in 2007 treated with stenting of the RCA. Normal EF at that time. He was apparently placed on full disability at the time of one of these MIs for unclear reasons.   Was in the ED 03/19/22 due to SOB and weight gain. D dimer elevated so CTA done but no PE or pneumonia. Was in the ED 04/14/22 due to a/c HF. Ultrasound negative for DVT. Diuresed with improvement of symptoms. Was in the ED 05/30/22 due to a tick bite on his back. Found to be hypokalemic/ hypocalcemic. Given IV potassium, calcium gluconate and small fluid bolus.   Stress test 07/2020: Abnormal myocardial perfusion scan. Mildly depressed left ventricular function mild inferior hypoejection fraction 45% there is evidence of what appears to be fixed defect in the inferior segment but no evidence of reversibility to segment mid represent scar versus artifact.   Echo 09/27/08: EF 60-65% with mild LAE Echo 07/05/16: EF 50-55% Echo 08/15/20: EF 45-50% Echo 04/17/22 EF 35-40% moderate LVH RV low normal.   LHC 07/05/16: Mid RCA to Dist RCA lesion, 100 %stenosed. RPDA lesion, 90 %stenosed. Dist Cx lesion, 50 %stenosed. 4th Mrg lesion, 75 %stenosed. Mid LAD to Dist LAD lesion, 20 %stenosed. A STENT XIENCE ALPINE RX 3.0X23 drug eluting stent was successfully placed, and overlaps previously placed stent. A STENT XIENCE ALPINE RX [id number] stent was successfully placed. Dist RCA lesion, 100 %stenosed. Post intervention, there is a 0%  residual stenosis. There is mild left ventricular systolic dysfunction. LV end diastolic pressure is normal. The left ventricular ejection fraction is 45-50% by visual estimate.  Conclusion STEMI inferior wall Successful PCI stent to mid RCA deployment of 2 overlapping DES stents reducing lesion from 90% down to 0 Diagnostic cardiac catheter of the left system which showed mild to moderate diffuse disease   He presents today for a HF f/u visit with a chief complaint of moderate shortness of breath with minimal exertion. Chronic in nature. Has associated fatigue, pedal edema (worsening), abdominal distention, cough, dizziness & difficulty sleeping along with this. Denies chest pain or palpitations along with this. Still waiting on bipap equipment and says that he has an appointment in 3 days to meet them in [place] and hopefully get it started.  Has cMRI scheduled on 12/24/22.  Spironolactone 25mg  has been started since last visit. He has been taking 12.5mg  daily due to concerns about his blood pressure getting too low. Has taken his torsemide this morning. Has worn compression socks but not consistently. Does feel like they help some when wearing them.   ROS: All systems negative except as listed in HPI, PMH and Problem List.  SH:  Social History   Socioeconomic History   Marital status: Married    Spouse name: Not on file   Number of children: Not on file   Years of education: Not on file   Highest education level: Not on file  Occupational History   Not on file  Tobacco Use   Smoking status: Every Day    Current packs/day: 0.00    Average packs/day: 1 pack/day for 15.0 years (15.0 ttl pk-yrs)    Types: Cigars, Cigarettes    Start date: 2005    Last attempt to quit: 2020    Years since quitting: 4.9   Smokeless tobacco: Never  Vaping Use   Vaping status: Never Used  Substance and Sexual Activity   Alcohol use: No   Drug use: No    Comment: prior hx of cocaine and marijuana    Sexual activity: Not on file  Other Topics Concern   Not on file  Social History Narrative   Not on file   Social Determinants of Health   Financial Resource Strain: Not on file  Food Insecurity: Not on file  Transportation Needs: Not on file  Physical Activity: Not on file  Stress: Not on file  Social Connections: Not on file  Intimate Partner Violence: Not on file    FH:  Family History  Adopted: Yes    Past Medical History:  Diagnosis Date   CHF (congestive heart failure) (HCC)    Coronary artery disease    Inferior myocardial infarction in 2004.  He was treated with thrombolytics at [facility].  He then had an inferior MI in March 2007.  He received a Horizon study stent 3.5 x 20 mm in the distal RCA at [facility].  No other obstructive disease.   Diabetes mellitus    TYPE II   Erectile dysfunction    Hyperlipidemia    Hypertension    MI (myocardial infarction) (HCC) 1610,9604   Morbid obesity (HCC)    Sleep apnea     Current Outpatient Medications  Medication Sig Dispense Refill   AMBULATORY NON FORMULARY MEDICATION Trimix (30/1/10)-(Pap/Phent/PGE)  Test Dose  1ml vial   Qty #3 Refills 0  [facility] [phone] Fax [phone] 3 mL 0   clopidogrel (PLAVIX) 75 MG tablet Take 75 mg by mouth daily.     Dulaglutide (TRULICITY) 1.5 MG/0.5ML SOPN Inject into the skin.     empagliflozin (JARDIANCE) 25 MG TABS tablet Take 25 mg by mouth daily.     metoprolol succinate (TOPROL-XL) 25 MG 24 hr tablet Take 0.5 tablets (12.5 mg total) by mouth daily. Take with or immediately following a meal. 45 tablet 2   nitroGLYCERIN (NITROSTAT) 0.4 MG SL tablet Place 1 tablet (0.4 mg total) under the tongue every 5 (five) minutes as needed for chest pain. 30 tablet 0   potassium chloride SA (KLOR-CON M) 20 MEQ tablet Take 1 tablet (20 mEq total) by mouth 3 (three) times a week.     spironolactone (ALDACTONE) 25 MG tablet Take 1 tablet (25 mg total) by mouth daily. 30 tablet 5    torsemide (DEMADEX) 20 MG tablet Take 2 tablets (40 mg total) by mouth 2 (two) times daily.     TRADJENTA 5 MG TABS tablet Take 5 mg by mouth daily.     traMADol (ULTRAM) 50 MG tablet Take 50 mg by mouth every 6 (six) hours as needed for pain.     No current facility-administered medications for this visit.   Vitals:   12/17/22 1106  BP: 103/79  Pulse: 87  SpO2: 98%  Weight: 267 lb (121.1 kg)  Height: 5\' 11"  (1.803 m)   Wt Readings from Last 3 Encounters:  12/17/22 267 lb (121.1 kg)  12/10/22 267 lb (121.1 kg)  11/30/22 253 lb 8.5 oz (115  kg)   Lab Results  Component Value Date   CREATININE 0.90 11/28/2022   CREATININE 0.92 11/19/2022   CREATININE 0.85 10/17/2022   PHYSICAL EXAM:  General:  Well appearing. No resp difficulty HEENT: normal Neck: supple. JVP flat. No lymphadenopathy or thryomegaly appreciated. Cor: PMI normal. Regular rate & rhythm. No rubs, gallops or murmurs. Lungs: clear Abdomen: soft, nontender, nondistended. No hepatosplenomegaly. No bruits or masses.  Extremities: no cyanosis, clubbing, rash, 2+ pitting edema bilaterally  Neuro: alert & oriented x3, cranial nerves grossly intact. Moves all 4 extremities w/o difficulty. Affect pleasant.  ECG: not done  Reds: unable to obtain reading   ASSESSMENT & PLAN:  1: Chronic systolic HF due to ICM - NYHA class III - mildly fluid overloaded but stable - not weighing daily; encouraged to resume weighing so that he can call for an overnight weight gain of > 2 pounds or a weekly weight gain of > 5 pounds - weight up 3 pounds from last visit here 1 week ago - trying to keep daily fluid intake to around 60-64 oz  - not adding salt but eats salty foods like fast food especially when working - s/p Inferior STEMI in 6/18. Cath with LAD 20% LCx 50%d. OM4 75% RCA 100%m  -> PCI with 2 overlapping DES stents - Myoview 07/22: EF45% inferior scar. No ischemia  - Echo 09/27/08: EF 60-65% with mild LAE - Echo 07/05/16:  EF 50-55% - Echo 08/15/20 EF 45-50% - Echo 04/17/22 EF 35-40% moderate LVH RV low normal - has cMRI scheduled next week - continue jardiance 25mg  daily (DM dose) - continue torsemide 40mg  1-2 times/ day - continue potassium 3 x / week - continue metoprolol succinate 12.5mg  daily  - continue spironolactone 12.5mg  daily - will give furoscix tomorrow and again in 3 days as he already took morning torsemide today; no torsemide on the days that he uses furoscix and he needs to take 2 potassium tablets with each dose of furoscix - repeat BMET next week - continue holding lisinopril due to BP - encouraged to resume wearing compression socks daily with removal at bedtime - has upcoming virtual orientation for pulmonary rehab - BMET/ proBNP today - BNP 10/17/22 was 483.5  2: HTN- - BP 103/79 - sees PCP Marvis Moeller)  - BMP 11/28/22 showed sodium 130, potassium 3.5, creatinine 0.9 & GFR>60 - BMET today  3. OSA- - CPAP titration has been completed and he's now waiting on equipment to be delivered - lowest O2 sat was 84.0% - he says that he has to go to [place] 12/20/22 for a 1 hour class on his bipap and then he can get the equipment; feel like his untreated OSA is driving his symptoms  4. DM2 - A1c 7.3% in 3/24 (primary care) - continue Jardiance 25mg  daily   5: CAD  - saw cardiology Juliann Pares) 11/24 - continue clopidogrel 75mg  daily - lipitor has been stopped and will start crestor 01/25 - LDL 11/19/22 was 172 - Cath 06/2016 with LAD 20% LCx 50%d. OM4 75% RCA 100%m  -> PCI with 2 overlapping DES stents - has cMRI next week  6. Tobacco use - smoking 5-6 cigars per day - encouraged cessation - at one time in the past, he had quit for 5  years but unfortunately, resume  7: Leg weakness- - sees vascular (Schnier) 12/24 - lipitor has been stopped and will start crestor 01/25 - discussed possibly ordering lymphapress compression boots if consistently wearing compression socks doesn't  help  Return next week, sooner if needed.

## 2022-12-17 NOTE — Progress Notes (Signed)
Medication Samples have been provided to the patient.  Drug name: Furoscix       Strength: 80mg  /43ml        Qty: 2  LOT: [id number]  Exp.Date: 12/21/2023  Dosing instructions: take as directed by the Heart Failure Team   The patient has been instructed regarding the correct time, dose, and frequency of taking this medication, including desired effects and most common side effects.   Faron Tudisco M Arpan Eskelson 11:44 AM 12/17/2022

## 2022-12-17 NOTE — Patient Instructions (Signed)
Your provider has order Furoscix for you. This is an on-body infuser that gives you a dose of Furosemide.   For questions regarding the device call Furoscix Direct at [phone]  Ensure you write down the time you start your infusion so that if there is a problem you will know how long the infusion lasted  Use Furoscix only AS DIRECTED by our office  Dosing Directions:   Day 1= Wednesday 12/18/22 use 1 kit  Day 2= Friday  12/19/28 use 1 kit.  DO NOT TAKE YOUR TORSEMIDE ON WEDNESDAY AND FRIDAY  TAKE 2 EXTRA POTASSIUM TABLETS ON WEDNESDAY AND FRIDAY     Go DOWN to LOWER LEVEL (LL) to have your blood work completed inside of [employer] office.  We will only call you if the results are abnormal or if the provider would like to make medication changes.   Your physician recommends that you schedule a follow-up appointment in: next week

## 2022-12-18 LAB — BASIC METABOLIC PANEL
BUN/Creatinine Ratio: 26 — ABNORMAL HIGH (ref 9–20)
BUN: 24 mg/dL (ref 6–24)
CO2: 28 mmol/L (ref 20–29)
Calcium: 8.5 mg/dL — ABNORMAL LOW (ref 8.7–10.2)
Chloride: 98 mmol/L (ref 96–106)
Creatinine, Ser: 0.94 mg/dL (ref 0.76–1.27)
Glucose: 107 mg/dL — ABNORMAL HIGH (ref 70–99)
Potassium: 4.8 mmol/L (ref 3.5–5.2)
Sodium: 141 mmol/L (ref 134–144)
eGFR: 98 mL/min/{1.73_m2} (ref >59)

## 2022-12-18 LAB — PRO B NATRIURETIC PEPTIDE: NT-Pro BNP: 6461 pg/mL — ABNORMAL HIGH (ref 0–121)

## 2022-12-23 ENCOUNTER — Encounter (HOSPITAL_COMMUNITY): Payer: Self-pay

## 2022-12-24 ENCOUNTER — Encounter: Payer: 59 | Attending: Internal Medicine | Admitting: *Deleted

## 2022-12-24 ENCOUNTER — Telehealth: Payer: Self-pay | Admitting: Family

## 2022-12-24 ENCOUNTER — Encounter: Payer: 59 | Admitting: Family

## 2022-12-24 DIAGNOSIS — I5022 Chronic systolic (congestive) heart failure: Secondary | ICD-10-CM

## 2022-12-24 NOTE — Progress Notes (Signed)
Initial phone call completed. Diagnosis can be found in [facility] 10/29. EP Orientation scheduled for Thursday 12/5 at 9:30.

## 2022-12-24 NOTE — Progress Notes (Unsigned)
PCP: Darreld Mclean, MD (last seen few months ago) Primary Cardiologist: Dorothyann Peng, MD (last seen 11/24; returns 02/25)   HPI:  Jesse [name] is a 52 y/o male with a history of CAD (MI 2004/ 2007), DM2, hyperlipidemia, HTN, OSA, previous tobacco use and chronic heart failure. Reports CAD with NSTEMI in 2004 and 2007. Had inferior STEMI in 6/18. Cath with LAD 20% LCx 50%d. OM4 75% RCA 100%m  -> PCI with 2 overlapping DES stents  Cardiac history dates back to 2004. Prior inferior MI in 2004 treated with thrombolytics and MI in 2007 treated with stenting of the RCA. Normal EF at that time. He was apparently placed on full disability at the time of one of these MIs for unclear reasons.   Was in the ED 03/19/22 due to SOB and weight gain. D dimer elevated so CTA done but no PE or pneumonia. Was in the ED 04/14/22 due to a/c HF. Ultrasound negative for DVT. Diuresed with improvement of symptoms. Was in the ED 05/30/22 due to a tick bite on his back. Found to be hypokalemic/ hypocalcemic. Given IV potassium, calcium gluconate and small fluid bolus.   Stress test 07/2020: Abnormal myocardial perfusion scan. Mildly depressed left ventricular function mild inferior hypoejection fraction 45% there is evidence of what appears to be fixed defect in the inferior segment but no evidence of reversibility to segment mid represent scar versus artifact.   Echo 09/27/08: EF 60-65% with mild LAE Echo 07/05/16: EF 50-55% Echo 08/15/20: EF 45-50% Echo 04/17/22 EF 35-40% moderate LVH RV low normal.   LHC 07/05/16: Mid RCA to Dist RCA lesion, 100 %stenosed. RPDA lesion, 90 %stenosed. Dist Cx lesion, 50 %stenosed. 4th Mrg lesion, 75 %stenosed. Mid LAD to Dist LAD lesion, 20 %stenosed. A STENT XIENCE ALPINE RX 3.0X23 drug eluting stent was successfully placed, and overlaps previously placed stent. A STENT XIENCE ALPINE RX [id number] stent was successfully placed. Dist RCA lesion, 100 %stenosed. Post intervention, there is a 0%  residual stenosis. There is mild left ventricular systolic dysfunction. LV end diastolic pressure is normal. The left ventricular ejection fraction is 45-50% by visual estimate.  Conclusion STEMI inferior wall Successful PCI stent to mid RCA deployment of 2 overlapping DES stents reducing lesion from 90% down to 0 Diagnostic cardiac catheter of the left system which showed mild to moderate diffuse disease   He presents today for a HF f/u visit with a chief complaint of   Has cMRI scheduled on 12/24/22.  2 doses of furoscix (with extra potassium) were given since last visit.    ROS: All systems negative except as listed in HPI, PMH and Problem List.  SH:  Social History   Socioeconomic History   Marital status: Married    Spouse name: Not on file   Number of children: Not on file   Years of education: Not on file   Highest education level: Not on file  Occupational History   Not on file  Tobacco Use   Smoking status: Every Day    Current packs/day: 0.00    Average packs/day: 1 pack/day for 15.0 years (15.0 ttl pk-yrs)    Types: Cigars, Cigarettes    Start date: 2005    Last attempt to quit: 2020    Years since quitting: 4.9   Smokeless tobacco: Never  Vaping Use   Vaping status: Never Used  Substance and Sexual Activity   Alcohol use: No   Drug use: No    Comment: prior hx of  cocaine and marijuana   Sexual activity: Not on file  Other Topics Concern   Not on file  Social History Narrative   Not on file   Social Determinants of Health   Financial Resource Strain: Not on file  Food Insecurity: Not on file  Transportation Needs: Not on file  Physical Activity: Not on file  Stress: Not on file  Social Connections: Not on file  Intimate Partner Violence: Not on file    FH:  Family History  Adopted: Yes    Past Medical History:  Diagnosis Date   CHF (congestive heart failure) (HCC)    Coronary artery disease    Inferior myocardial infarction in 2004.  He  was treated with thrombolytics at [facility].  He then had an inferior MI in March 2007.  He received a Horizon study stent 3.5 x 20 mm in the distal RCA at [facility].  No other obstructive disease.   Diabetes mellitus    TYPE II   Erectile dysfunction    Hyperlipidemia    Hypertension    MI (myocardial infarction) (HCC) [id number]   Morbid obesity (HCC)    Sleep apnea     Current Outpatient Medications  Medication Sig Dispense Refill   AMBULATORY NON FORMULARY MEDICATION Trimix (30/1/10)-(Pap/Phent/PGE)  Test Dose  1ml vial   Qty #3 Refills 0  [facility] [phone] Fax [phone] 3 mL 0   clopidogrel (PLAVIX) 75 MG tablet Take 75 mg by mouth daily.     Dulaglutide (TRULICITY) 1.5 MG/0.5ML SOPN Inject into the skin.     empagliflozin (JARDIANCE) 25 MG TABS tablet Take 25 mg by mouth daily.     metoprolol succinate (TOPROL-XL) 25 MG 24 hr tablet Take 0.5 tablets (12.5 mg total) by mouth daily. Take with or immediately following a meal. 45 tablet 2   nitroGLYCERIN (NITROSTAT) 0.4 MG SL tablet Place 1 tablet (0.4 mg total) under the tongue every 5 (five) minutes as needed for chest pain. 30 tablet 0   potassium chloride SA (KLOR-CON M) 20 MEQ tablet Take 1 tablet (20 mEq total) by mouth 3 (three) times a week.     spironolactone (ALDACTONE) 25 MG tablet Take 1 tablet (25 mg total) by mouth daily. (Patient taking differently: Take 12.5 mg by mouth daily.) 30 tablet 5   torsemide (DEMADEX) 20 MG tablet Take 2 tablets (40 mg total) by mouth 2 (two) times daily.     TRADJENTA 5 MG TABS tablet Take 5 mg by mouth daily.     traMADol (ULTRAM) 50 MG tablet Take 50 mg by mouth every 6 (six) hours as needed for pain.     No current facility-administered medications for this visit.     PHYSICAL EXAM:  General:  Well appearing. No resp difficulty HEENT: normal Neck: supple. JVP flat. No lymphadenopathy or thryomegaly appreciated. Cor: PMI normal. Regular rate & rhythm. No rubs,  gallops or murmurs. Lungs: clear Abdomen: soft, nontender, nondistended. No hepatosplenomegaly. No bruits or masses.  Extremities: no cyanosis, clubbing, rash, 2+ pitting edema bilaterally  Neuro: alert & oriented x3, cranial nerves grossly intact. Moves all 4 extremities w/o difficulty. Affect pleasant.  ECG: not done  Reds: unable to obtain reading   ASSESSMENT & PLAN:  1: Chronic systolic HF due to ICM - NYHA class III - mildly fluid overloaded but stable - not weighing daily; encouraged to resume weighing so that he can call for an overnight weight gain of > 2 pounds or a weekly weight  gain of > 5 pounds - weight 267 pounds from last visit here 1 week ago - trying to keep daily fluid intake to around 60-64 oz  - not adding salt but eats salty foods like fast food especially when working - s/p Inferior STEMI in 6/18. Cath with LAD 20% LCx 50%d. OM4 75% RCA 100%m  -> PCI with 2 overlapping DES stents - Myoview 07/22: EF45% inferior scar. No ischemia  - Echo 09/27/08: EF 60-65% with mild LAE - Echo 07/05/16: EF 50-55% - Echo 08/15/20 EF 45-50% - Echo 04/17/22 EF 35-40% moderate LVH RV low normal - has cMRI scheduled for tomorrow - continue jardiance 25mg  daily (DM dose) - continue torsemide 40mg  1-2 times/ day - continue potassium 3 x / week - continue metoprolol succinate 12.5mg  daily  - continue spironolactone 12.5mg  daily - repeat BMET today - continue holding lisinopril due to BP - encouraged to resume wearing compression socks daily with removal at bedtime - has upcoming virtual orientation for pulmonary rehab - BMET/ BNP today - proBNP 12/17/22 was 6461  2: HTN- - BP  - sees PCP Marvis Moeller)  - BMP 12/17/22 showed sodium 141, potassium 4.8, creatinine 0.94 & GFR 98 - BMET today  3. OSA- - CPAP titration has been completed and he's now waiting on equipment to be delivered - lowest O2 sat was 84.0% - he says that he has to go to [place] 12/20/22 for a 1 hour class on his  bipap and then he can get the equipment; feel like his untreated OSA is driving his symptoms  4. DM2 - A1c 7.3% in 3/24 (primary care) - continue Jardiance 25mg  daily   5: CAD  - saw cardiology Juliann Pares) 11/24 - continue clopidogrel 75mg  daily - lipitor has been stopped and will start crestor 01/25 - LDL 11/19/22 was 172 - Cath 06/2016 with LAD 20% LCx 50%d. OM4 75% RCA 100%m  -> PCI with 2 overlapping DES stents - has cMRI next week  6. Tobacco use - smoking 5-6 cigars per day - encouraged cessation - at one time in the past, he had quit for 5  years but unfortunately, resume  7: Leg weakness- - sees vascular (Schnier) 12/24 - lipitor has been stopped and will start crestor 01/25 - discussed possibly ordering lymphapress compression boots if consistently wearing compression socks doesn't help

## 2022-12-24 NOTE — Telephone Encounter (Signed)
Patient did not show for his Heart Failure Clinic appointment on 12/24/22.

## 2022-12-25 ENCOUNTER — Ambulatory Visit (HOSPITAL_BASED_OUTPATIENT_CLINIC_OR_DEPARTMENT_OTHER)
Admission: RE | Admit: 2022-12-25 | Discharge: 2022-12-25 | Disposition: A | Discharge status: Home / Self Care | Payer: 59 | Source: Ambulatory Visit | Attending: Internal Medicine | Admitting: Internal Medicine

## 2022-12-25 ENCOUNTER — Other Ambulatory Visit: Payer: Self-pay | Admitting: Internal Medicine

## 2022-12-25 DIAGNOSIS — I130 Hypertensive heart and chronic kidney disease with heart failure and stage 1 through stage 4 chronic kidney disease, or unspecified chronic kidney disease: Secondary | ICD-10-CM | POA: Diagnosis not present

## 2022-12-25 DIAGNOSIS — E8770 Fluid overload, unspecified: Secondary | ICD-10-CM | POA: Diagnosis not present

## 2022-12-25 DIAGNOSIS — I2510 Atherosclerotic heart disease of native coronary artery without angina pectoris: Secondary | ICD-10-CM

## 2022-12-25 DIAGNOSIS — I5023 Acute on chronic systolic (congestive) heart failure: Secondary | ICD-10-CM | POA: Insufficient documentation

## 2022-12-25 DIAGNOSIS — I429 Cardiomyopathy, unspecified: Secondary | ICD-10-CM | POA: Insufficient documentation

## 2022-12-25 MED ORDER — GADOBUTROL 1 MMOL/ML IV SOLN
15.0000 mL | Freq: Once | INTRAVENOUS | Status: AC | PRN
Start: 2022-12-25 — End: 2022-12-25
  Administered 2022-12-25: 15 mL via INTRAVENOUS

## 2022-12-26 ENCOUNTER — Other Ambulatory Visit: Payer: Self-pay

## 2022-12-26 ENCOUNTER — Emergency Department: Payer: 59

## 2022-12-26 ENCOUNTER — Encounter: Payer: Self-pay | Admitting: Family

## 2022-12-26 ENCOUNTER — Ambulatory Visit: Payer: 59 | Admitting: Family

## 2022-12-26 ENCOUNTER — Ambulatory Visit: Payer: 59

## 2022-12-26 ENCOUNTER — Encounter: Payer: Self-pay | Admitting: Emergency Medicine

## 2022-12-26 ENCOUNTER — Inpatient Hospital Stay
Admission: EM | Admit: 2022-12-26 | Discharge: 2022-12-31 | DRG: 291 | Disposition: A | Discharge status: Home / Self Care | Payer: 59 | Attending: Hospitalist | Admitting: Hospitalist

## 2022-12-26 VITALS — BP 84/62 | HR 83 | Wt 281.5 lb

## 2022-12-26 DIAGNOSIS — I5043 Acute on chronic combined systolic (congestive) and diastolic (congestive) heart failure: Secondary | ICD-10-CM | POA: Diagnosis present

## 2022-12-26 DIAGNOSIS — Z7982 Long term (current) use of aspirin: Secondary | ICD-10-CM

## 2022-12-26 DIAGNOSIS — I5022 Chronic systolic (congestive) heart failure: Secondary | ICD-10-CM | POA: Diagnosis not present

## 2022-12-26 DIAGNOSIS — I5023 Acute on chronic systolic (congestive) heart failure: Secondary | ICD-10-CM | POA: Insufficient documentation

## 2022-12-26 DIAGNOSIS — I472 Ventricular tachycardia, unspecified: Secondary | ICD-10-CM | POA: Diagnosis not present

## 2022-12-26 DIAGNOSIS — F17290 Nicotine dependence, other tobacco product, uncomplicated: Secondary | ICD-10-CM | POA: Diagnosis present

## 2022-12-26 DIAGNOSIS — I5030 Unspecified diastolic (congestive) heart failure: Secondary | ICD-10-CM | POA: Diagnosis not present

## 2022-12-26 DIAGNOSIS — Z7985 Long-term (current) use of injectable non-insulin antidiabetic drugs: Secondary | ICD-10-CM | POA: Diagnosis not present

## 2022-12-26 DIAGNOSIS — Z955 Presence of coronary angioplasty implant and graft: Secondary | ICD-10-CM

## 2022-12-26 DIAGNOSIS — Z91199 Patient's noncompliance with other medical treatment and regimen due to unspecified reason: Secondary | ICD-10-CM | POA: Diagnosis not present

## 2022-12-26 DIAGNOSIS — E66813 Obesity, class 3: Secondary | ICD-10-CM | POA: Diagnosis present

## 2022-12-26 DIAGNOSIS — R531 Weakness: Secondary | ICD-10-CM | POA: Diagnosis present

## 2022-12-26 DIAGNOSIS — I2510 Atherosclerotic heart disease of native coronary artery without angina pectoris: Secondary | ICD-10-CM | POA: Insufficient documentation

## 2022-12-26 DIAGNOSIS — Z79899 Other long term (current) drug therapy: Secondary | ICD-10-CM

## 2022-12-26 DIAGNOSIS — I110 Hypertensive heart disease with heart failure: Secondary | ICD-10-CM | POA: Insufficient documentation

## 2022-12-26 DIAGNOSIS — Z9049 Acquired absence of other specified parts of digestive tract: Secondary | ICD-10-CM

## 2022-12-26 DIAGNOSIS — E119 Type 2 diabetes mellitus without complications: Secondary | ICD-10-CM

## 2022-12-26 DIAGNOSIS — E1122 Type 2 diabetes mellitus with diabetic chronic kidney disease: Secondary | ICD-10-CM | POA: Diagnosis present

## 2022-12-26 DIAGNOSIS — I2489 Other forms of acute ischemic heart disease: Secondary | ICD-10-CM | POA: Diagnosis present

## 2022-12-26 DIAGNOSIS — Z7984 Long term (current) use of oral hypoglycemic drugs: Secondary | ICD-10-CM | POA: Diagnosis not present

## 2022-12-26 DIAGNOSIS — I10 Essential (primary) hypertension: Secondary | ICD-10-CM | POA: Diagnosis not present

## 2022-12-26 DIAGNOSIS — I130 Hypertensive heart and chronic kidney disease with heart failure and stage 1 through stage 4 chronic kidney disease, or unspecified chronic kidney disease: Secondary | ICD-10-CM | POA: Diagnosis present

## 2022-12-26 DIAGNOSIS — Z7902 Long term (current) use of antithrombotics/antiplatelets: Secondary | ICD-10-CM

## 2022-12-26 DIAGNOSIS — R29898 Other symptoms and signs involving the musculoskeletal system: Secondary | ICD-10-CM

## 2022-12-26 DIAGNOSIS — N189 Chronic kidney disease, unspecified: Secondary | ICD-10-CM | POA: Diagnosis present

## 2022-12-26 DIAGNOSIS — Z720 Tobacco use: Secondary | ICD-10-CM

## 2022-12-26 DIAGNOSIS — I252 Old myocardial infarction: Secondary | ICD-10-CM

## 2022-12-26 DIAGNOSIS — I255 Ischemic cardiomyopathy: Secondary | ICD-10-CM | POA: Diagnosis present

## 2022-12-26 DIAGNOSIS — I517 Cardiomegaly: Secondary | ICD-10-CM | POA: Diagnosis not present

## 2022-12-26 DIAGNOSIS — I429 Cardiomyopathy, unspecified: Secondary | ICD-10-CM

## 2022-12-26 DIAGNOSIS — Z6839 Body mass index (BMI) 39.0-39.9, adult: Secondary | ICD-10-CM | POA: Diagnosis not present

## 2022-12-26 DIAGNOSIS — F17210 Nicotine dependence, cigarettes, uncomplicated: Secondary | ICD-10-CM | POA: Diagnosis present

## 2022-12-26 DIAGNOSIS — E8770 Fluid overload, unspecified: Secondary | ICD-10-CM | POA: Diagnosis present

## 2022-12-26 DIAGNOSIS — E785 Hyperlipidemia, unspecified: Secondary | ICD-10-CM | POA: Diagnosis present

## 2022-12-26 DIAGNOSIS — I959 Hypotension, unspecified: Secondary | ICD-10-CM

## 2022-12-26 DIAGNOSIS — E859 Amyloidosis, unspecified: Secondary | ICD-10-CM | POA: Diagnosis present

## 2022-12-26 DIAGNOSIS — E1165 Type 2 diabetes mellitus with hyperglycemia: Secondary | ICD-10-CM | POA: Diagnosis not present

## 2022-12-26 DIAGNOSIS — G4733 Obstructive sleep apnea (adult) (pediatric): Secondary | ICD-10-CM

## 2022-12-26 DIAGNOSIS — E876 Hypokalemia: Secondary | ICD-10-CM | POA: Diagnosis present

## 2022-12-26 DIAGNOSIS — I081 Rheumatic disorders of both mitral and tricuspid valves: Secondary | ICD-10-CM | POA: Diagnosis not present

## 2022-12-26 DIAGNOSIS — I509 Heart failure, unspecified: Principal | ICD-10-CM

## 2022-12-26 DIAGNOSIS — R7989 Other specified abnormal findings of blood chemistry: Secondary | ICD-10-CM | POA: Insufficient documentation

## 2022-12-26 LAB — CBC
HCT: 47.2 % (ref 39.0–52.0)
Hemoglobin: 15.5 g/dL (ref 13.0–17.0)
MCH: 27.5 pg (ref 26.0–34.0)
MCHC: 32.8 g/dL (ref 30.0–36.0)
MCV: 83.8 fL (ref 80.0–100.0)
Platelets: 428 10*3/uL — ABNORMAL HIGH (ref 150–400)
RBC: 5.63 MIL/uL (ref 4.22–5.81)
RDW: 16.4 % — ABNORMAL HIGH (ref 11.5–15.5)
WBC: 9.8 10*3/uL (ref 4.0–10.5)
nRBC: 0 % (ref 0.0–0.2)

## 2022-12-26 LAB — BASIC METABOLIC PANEL
Anion gap: 10 (ref 5–15)
BUN: 33 mg/dL — ABNORMAL HIGH (ref 6–20)
CO2: 28 mmol/L (ref 22–32)
Calcium: 7.9 mg/dL — ABNORMAL LOW (ref 8.9–10.3)
Chloride: 96 mmol/L — ABNORMAL LOW (ref 98–111)
Creatinine, Ser: 1.18 mg/dL (ref 0.61–1.24)
GFR, Estimated: >60 mL/min (ref >60)
Glucose, Bld: 193 mg/dL — ABNORMAL HIGH (ref 70–99)
Potassium: 3.7 mmol/L (ref 3.5–5.1)
Sodium: 134 mmol/L — ABNORMAL LOW (ref 135–145)

## 2022-12-26 LAB — CBG MONITORING, ED: Glucose-Capillary: 135 mg/dL — ABNORMAL HIGH (ref 70–99)

## 2022-12-26 LAB — PROTIME-INR
INR: 1.1 (ref 0.8–1.2)
Prothrombin Time: 14.3 s (ref 11.4–15.2)

## 2022-12-26 LAB — BRAIN NATRIURETIC PEPTIDE: B Natriuretic Peptide: 827.9 pg/mL — ABNORMAL HIGH (ref 0.0–100.0)

## 2022-12-26 LAB — TROPONIN I (HIGH SENSITIVITY)
Troponin I (High Sensitivity): 144 ng/L (ref <18)
Troponin I (High Sensitivity): 202 ng/L (ref <18)

## 2022-12-26 MED ORDER — FUROSEMIDE 10 MG/ML IJ SOLN
30.0000 mg/h | INTRAVENOUS | Status: DC
  Administered 2022-12-26: 8 mg/h via INTRAVENOUS
  Administered 2022-12-27 – 2022-12-30 (×7): 30 mg/h via INTRAVENOUS
  Filled 2022-12-26 (×10): qty 20

## 2022-12-26 MED ORDER — ACETAMINOPHEN 325 MG PO TABS: 650.0000 mg | ORAL_TABLET | Freq: Four times a day (QID) | ORAL | Status: DC | PRN

## 2022-12-26 MED ORDER — FUROSEMIDE 10 MG/ML IJ SOLN
80.0000 mg | Freq: Once | INTRAMUSCULAR | Status: DC
  Filled 2022-12-26: qty 8

## 2022-12-26 MED ORDER — ONDANSETRON HCL 4 MG/2ML IJ SOLN: 4.0000 mg | Freq: Four times a day (QID) | INTRAMUSCULAR | Status: DC | PRN

## 2022-12-26 MED ORDER — INSULIN ASPART 100 UNIT/ML IJ SOLN: 0.0000 [IU] | Freq: Every day | INTRAMUSCULAR | Status: DC

## 2022-12-26 MED ORDER — ONDANSETRON HCL 4 MG PO TABS: 4.0000 mg | ORAL_TABLET | Freq: Four times a day (QID) | ORAL | Status: DC | PRN

## 2022-12-26 MED ORDER — MIDODRINE HCL 5 MG PO TABS
10.0000 mg | ORAL_TABLET | Freq: Three times a day (TID) | ORAL | Status: DC
  Administered 2022-12-27 – 2022-12-31 (×13): 10 mg via ORAL
  Filled 2022-12-26 (×12): qty 2

## 2022-12-26 MED ORDER — HYDROCODONE-ACETAMINOPHEN 5-325 MG PO TABS: 1.0000 | ORAL_TABLET | ORAL | Status: DC | PRN

## 2022-12-26 MED ORDER — ACETAMINOPHEN 650 MG RE SUPP: 650.0000 mg | Freq: Four times a day (QID) | RECTAL | Status: DC | PRN

## 2022-12-26 MED ORDER — METOPROLOL SUCCINATE ER 25 MG PO TB24: 12.5000 mg | ORAL_TABLET | Freq: Every day | ORAL | Status: DC

## 2022-12-26 MED ORDER — ENOXAPARIN SODIUM 80 MG/0.8ML IJ SOSY
0.5000 mg/kg | PREFILLED_SYRINGE | INTRAMUSCULAR | Status: DC
  Administered 2022-12-26 – 2022-12-30 (×5): 65 mg via SUBCUTANEOUS
  Filled 2022-12-26 (×3): qty 0.8
  Filled 2022-12-26 (×2): qty 0.65
  Filled 2022-12-26: qty 0.8

## 2022-12-26 MED ORDER — INSULIN ASPART 100 UNIT/ML IJ SOLN: 0.0000 [IU] | Freq: Three times a day (TID) | INTRAMUSCULAR | Status: DC

## 2022-12-26 MED ORDER — ASPIRIN 81 MG PO TBEC
81.0000 mg | DELAYED_RELEASE_TABLET | Freq: Every day | ORAL | Status: DC
  Administered 2022-12-27 – 2022-12-31 (×5): 81 mg via ORAL
  Filled 2022-12-26 (×5): qty 1

## 2022-12-26 MED ORDER — MIDODRINE HCL 5 MG PO TABS
5.0000 mg | ORAL_TABLET | Freq: Once | ORAL | Status: AC
  Administered 2022-12-26: 5 mg via ORAL
  Filled 2022-12-26: qty 1

## 2022-12-26 MED ORDER — ALBUTEROL SULFATE (2.5 MG/3ML) 0.083% IN NEBU: 2.5000 mg | INHALATION_SOLUTION | RESPIRATORY_TRACT | Status: DC | PRN

## 2022-12-26 MED ORDER — ASPIRIN 81 MG PO CHEW
324.0000 mg | CHEWABLE_TABLET | Freq: Once | ORAL | Status: AC
Start: 2022-12-26 — End: 2022-12-26
  Administered 2022-12-26: 324 mg via ORAL
  Filled 2022-12-26: qty 4

## 2022-12-26 MED ORDER — FUROSEMIDE 10 MG/ML IJ SOLN: 40.0000 mg | Freq: Once | INTRAMUSCULAR | Status: DC

## 2022-12-26 NOTE — Assessment & Plan Note (Signed)
Elevated troponin, suspect demand ischemia Patient denies chest pain and EKG nonacute Continue to trend troponin Received aspirin 324 mg in the ED Continue daily aspirin Echo to evaluate for wall motion abnormality Cardiology consult

## 2022-12-26 NOTE — Assessment & Plan Note (Addendum)
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

## 2022-12-26 NOTE — H&P (Signed)
History and Physical    Patient: Jesse [name] WGN:[id number] DOB: 1970-02-16 DOA: 12/26/2022 DOS: the patient was seen and examined on 12/26/2022 PCP: Leanna Sato, MD  Patient coming from: Home  Chief Complaint:  Chief Complaint  Patient presents with   Shortness of Breath    HPI: Jesse [name] is a 52 y.o. 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  He was fluid overloaded and also hypotensive to 84/62.  With sats of 100%.  Plan is for Lasix infusion and midodrine Cardiology note reviewed.  Patient reported increasing dyspnea on exertion and fluid retention and weight gain, in spite of compliance with medication.  He had a cardiac MRI on 12/4 that suggested amyloid disease.  On arrival patient endorses the above symptoms.  He denies chest pain.  He denies cough, fever or chills.  He has complained of leg weakness in the past for which he follows with vascular . ED course and data review: BP 103/76 on arrival with otherwise normal vitals Pertinent Labs: Troponin 144 and BNP 827 CBC WNL, BMP mostly unremarkable EKG, personally viewed and interpreted showing sinus at 79 with nonspecific ST-T wave changes. Chest x-ray favors mild congestive heart failure/pulmonary edema Patient was given a dose of midodrine 5 mg in the ED and IV Lasix 80 mg x 1 as well as chewable aspirin 324 given elevated troponin Hospitalist consulted for admission.   Review of Systems: As mentioned in the history of present illness. All other systems reviewed and are negative.  Past Medical History:  Diagnosis Date   CHF (congestive heart failure) (HCC)    Coronary artery disease     Inferior myocardial infarction in 2004.  He was treated with thrombolytics at [facility].  He then had an inferior MI in March 2007.  He received a Horizon study stent 3.5 x 20 mm in the distal RCA at [facility].  No other obstructive disease.   Diabetes mellitus    TYPE II   Erectile dysfunction    Hyperlipidemia    Hypertension    MI (myocardial infarction) (HCC) 7846,9629   Morbid obesity (HCC)    Sleep apnea    Past Surgical History:  Procedure Laterality Date   CARDIAC CATHETERIZATION     @ [facility]   CHOLECYSTECTOMY     LEFT HEART CATH AND CORONARY ANGIOGRAPHY N/A 07/05/2016   Procedure: Left Heart Cath and Coronary Angiography;  Surgeon: Alwyn Pea, MD;  Location: [facility] INVASIVE CV LAB;  Service: Cardiovascular;  Laterality: N/A;   SEPTOPLASTY     Social History:  reports that he has been smoking cigars and cigarettes. He started smoking about 19 years ago. He has a 15 pack-year smoking history. He has never used smokeless tobacco. He reports that he does not drink alcohol and does not use drugs.  No Known Allergies  Family History  Adopted: Yes    Prior to Admission medications   Medication Sig Start Date End Date Taking? Authorizing Provider  AMBULATORY NON FORMULARY MEDICATION Trimix (30/1/10)-(Pap/Phent/PGE)  Test Dose  1ml vial   Qty #3 Refills 0  [facility] [phone] Fax [phone] Patient not taking: Reported on 12/26/2022 09/12/21   [name], PA-C  clopidogrel (PLAVIX) 75 MG tablet Take 75 mg by mouth daily.    [provider]  Dulaglutide (TRULICITY) 1.5 MG/0.5ML SOPN Inject into the skin. Patient not taking: Reported on 12/26/2022    [provider]  empagliflozin (JARDIANCE) 25 MG TABS tablet Take 25 mg by mouth daily. Patient not taking: Reported on 12/26/2022    [provider]  metoprolol succinate (TOPROL-XL) 25 MG 24 hr tablet Take 0.5 tablets (12.5 mg total) by mouth daily. Take with or immediately  following a meal. 11/19/22 08/16/23  Delma Freeze, FNP  nitroGLYCERIN (NITROSTAT) 0.4 MG SL tablet Place 1 tablet (0.4 mg total) under the tongue every 5 (five) minutes as needed for chest pain. 02/28/17   Adrian Saran, MD  potassium chloride SA (KLOR-CON M) 20 MEQ tablet Take 1 tablet (20 mEq total) by mouth 3 (three) times a week. 11/20/22   Delma Freeze, FNP  spironolactone (ALDACTONE) 25 MG tablet Take 1 tablet (25 mg total) by mouth daily. Patient not taking: Reported on 12/26/2022 12/13/22   Delma Freeze, FNP  torsemide (DEMADEX) 20 MG tablet Take 2 tablets (40 mg total) by mouth 2 (two) times daily. 11/19/22   Hackney, Inetta Fermo A, FNP  TRADJENTA 5 MG TABS tablet Take 5 mg by mouth daily. Patient not taking: Reported on 12/26/2022 02/22/17   [provider]  traMADol (ULTRAM) 50 MG tablet Take 50 mg by mouth every 6 (six) hours as needed for pain. Patient not taking: Reported on 12/26/2022 02/13/17   [provider]    Physical Exam: Vitals:   12/26/22 2110 12/26/22 2120 12/26/22 2130 12/26/22 2145  BP:   95/77   Pulse: 87 88 86 85  Resp: (!) 21 19 11 17   Temp:      TempSrc:      SpO2:      Weight:      Height:       Physical Exam Vitals and nursing note reviewed.  Constitutional:      General: He is not in acute distress. HENT:     Head: Normocephalic and atraumatic.  Cardiovascular:     Rate and Rhythm: Normal rate and regular rhythm.     Heart sounds: Normal heart sounds.  Pulmonary:     Effort: Pulmonary effort is normal.     Breath sounds: Decreased breath sounds present.  Abdominal:     Palpations: Abdomen is soft.     Tenderness: There is no abdominal tenderness.  Musculoskeletal:     Right lower leg: Edema present.     Left lower leg: Edema present.  Neurological:     Mental Status: Mental status is at baseline.     Labs on Admission: I have personally reviewed following labs and imaging studies  CBC: Recent Labs  Lab 12/26/22 1612  WBC  9.8  HGB 15.5  HCT 47.2  MCV 83.8  PLT 428*   Basic Metabolic Panel: Recent Labs  Lab 12/26/22 1612  NA 134*  K 3.7  CL 96*  CO2 28  GLUCOSE 193*  BUN 33*  CREATININE 1.18  CALCIUM 7.9*   GFR: Estimated Creatinine Clearance: 99.6 mL/min (by C-G formula based on SCr of 1.18 mg/dL). Liver Function Tests: No results for input(s): "AST", "ALT", "ALKPHOS", "BILITOT", "PROT", "ALBUMIN" in the last 168 hours. No results for input(s): "LIPASE", "AMYLASE" in the last 168 hours. No results for input(s): "AMMONIA" in the last 168 hours. Coagulation Profile: Recent Labs  Lab 12/26/22 1612  INR  1.1   Cardiac Enzymes: No results for input(s): "CKTOTAL", "CKMB", "CKMBINDEX", "TROPONINI" in the last 168 hours. BNP (last 3 results) Recent Labs    12/17/22 1159  PROBNP 6,461*   HbA1C: No results for input(s): "HGBA1C" in the last 72 hours. CBG: No results for input(s): "GLUCAP" in the last 168 hours. Lipid Profile: No results for input(s): "CHOL", "HDL", "LDLCALC", "TRIG", "CHOLHDL", "LDLDIRECT" in the last 72 hours. Thyroid Function Tests: No results for input(s): "TSH", "T4TOTAL", "FREET4", "T3FREE", "THYROIDAB" in the last 72 hours. Anemia Panel: No results for input(s): "VITAMINB12", "FOLATE", "FERRITIN", "TIBC", "IRON", "RETICCTPCT" in the last 72 hours. Urine analysis:    Component Value Date/Time   COLORURINE YELLOW 09/26/2020 0929   APPEARANCEUR CLEAR 09/26/2020 0929   LABSPEC 1.010 09/26/2020 0929   PHURINE 5.0 09/26/2020 0929   GLUCOSEU 500 (A) 09/26/2020 0929   HGBUR TRACE (A) 09/26/2020 0929   BILIRUBINUR NEGATIVE 09/26/2020 0929   KETONESUR 40 (A) 09/26/2020 0929   PROTEINUR 100 (A) 09/26/2020 0929   NITRITE NEGATIVE 09/26/2020 0929   LEUKOCYTESUR NEGATIVE 09/26/2020 0929    Radiological Exams on Admission: DG Chest 2 View  Result Date: 12/26/2022 CLINICAL DATA:  Chest pain. EXAM: CHEST - 2 VIEW COMPARISON:  05/30/2022. FINDINGS: Low lung volume. There  are diffuse increased interstitial markings when compared to the prior exam. Bilateral lung fields are otherwise clear. No acute consolidation or lung collapse. Bilateral costophrenic angles are clear. Stable cardio-mediastinal silhouette. No acute osseous abnormalities. The soft tissues are within normal limits. IMPRESSION: *In appropriate clinical setting, findings favor mild congestive heart failure/pulmonary edema. Electronically Signed   By: Jules Schick M.D.   On: 12/26/2022 18:28   MR CARDIAC MORPHOLOGY W WO CONTRAST  Result Date: 12/25/2022 CLINICAL DATA:  Cardiomyopathy, hx of CAD/PCI. Eval for infiltrative process. EXAM: CARDIAC MRI TECHNIQUE: The patient was scanned on a 1.5 Tesla Siemens magnet. A dedicated cardiac coil was used. Functional imaging was done using Fiesta sequences. 2,3, and 4 chamber views were done to assess for RWMA's. Modified Simpson's rule using a short axis stack was used to calculate an ejection fraction on a dedicated work [employer]. The patient received 15 cc of Gadavist. After 10 minutes inversion recovery sequences were used to assess for infiltration and scar tissue. Velocity flow mapping performed in the ascending aorta and main pulmonary artery. CONTRAST:  15 cc  of Gadavist FINDINGS: 1. Mildly dilated left ventricular size, mild LV thickness, moderately reduced LV systolic function (LVEF = 37%). There is global hypokinesis. There is diffuse midwall late gadolinium enhancement in the left ventricular myocardium. LVEDV: 235 ml LVESV: 149 ml SV: 86 ml CO: 5L/min Myocardial mass: 207g LV native T1 value 1104 ms (normal <1000 ms) LV ECV value 62%.  Normal <30%. 2. Normal right ventricular size, thickness, moderately reduced systolic function (RVEF = 30%). There is global hypokinesis. 3.  Mildly dilated left atrial size. normal right atrial size. 4. Normal size of the aortic root, ascending aorta and pulmonary artery. 5. Mild mitral regurgitation,  trivial pulmonary regurgitation. No significant valvular abnormalities. 6.  Normal pericardium.  trace pericardial effusion. IMPRESSION: 1. Mildly dilated LV, moderately reduced LV systolic function. LVEF 37%. 2.  Diffuse midwall LGE/scar 3. Elevated native T1 and ECV values suggesting infiltrative disease. 4.  Moderately reduced RV function. 5.  Trivial pericardial effusion 6. Findings suggest infiltrative cardiomyopathy. Recommend complete workup for amyloidosis Electronically Signed   By: Debbe Odea M.D.   On: 12/25/2022 17:50  MR CARDIAC VELOCITY FLOW MAP  Result Date: 12/25/2022 CLINICAL DATA:  Cardiomyopathy, hx of CAD/PCI. Eval for infiltrative process. EXAM: CARDIAC MRI TECHNIQUE: The patient was scanned on a 1.5 Tesla Siemens magnet. A dedicated cardiac coil was used. Functional imaging was done using Fiesta sequences. 2,3, and 4 chamber views were done to assess for RWMA's. Modified Simpson's rule using a short axis stack was used to calculate an ejection fraction on a dedicated work [employer], [employer]. The patient received 15 cc of Gadavist. After 10 minutes inversion recovery sequences were used to assess for infiltration and scar tissue. Velocity flow mapping performed in the ascending aorta and main pulmonary artery. CONTRAST:  15 cc  of Gadavist FINDINGS: 1. Mildly dilated left ventricular size, mild LV thickness, moderately reduced LV systolic function (LVEF = 37%). There is global hypokinesis. There is diffuse midwall late gadolinium enhancement in the left ventricular myocardium. LVEDV: 235 ml LVESV: 149 ml SV: 86 ml CO: 5L/min Myocardial mass: 207g LV native T1 value 1104 ms (normal <1000 ms) LV ECV value 62%.  Normal <30%. 2. Normal right ventricular size, thickness, moderately reduced systolic function (RVEF = 30%). There is global hypokinesis. 3.  Mildly dilated left atrial size. normal right atrial size. 4. Normal size of the aortic root, ascending aorta and pulmonary  artery. 5. Mild mitral regurgitation, trivial pulmonary regurgitation. No significant valvular abnormalities. 6.  Normal pericardium.  trace pericardial effusion. IMPRESSION: 1. Mildly dilated LV, moderately reduced LV systolic function. LVEF 37%. 2.  Diffuse midwall LGE/scar 3. Elevated native T1 and ECV values suggesting infiltrative disease. 4.  Moderately reduced RV function. 5.  Trivial pericardial effusion 6. Findings suggest infiltrative cardiomyopathy. Recommend complete workup for amyloidosis Electronically Signed   By: Debbe Odea M.D.   On: 12/25/2022 17:50   MR CARDIAC VELOCITY FLOW MAP  Result Date: 12/25/2022 CLINICAL DATA:  Cardiomyopathy, hx of CAD/PCI. Eval for infiltrative process. EXAM: CARDIAC MRI TECHNIQUE: The patient was scanned on a 1.5 Tesla Siemens magnet. A dedicated cardiac coil was used. Functional imaging was done using Fiesta sequences. 2,3, and 4 chamber views were done to assess for RWMA's. Modified Simpson's rule using a short axis stack was used to calculate an ejection fraction on a dedicated work [employer] officer, [employer]. The patient received 15 cc of Gadavist. After 10 minutes inversion recovery sequences were used to assess for infiltration and scar tissue. Velocity flow mapping performed in the ascending aorta and main pulmonary artery. CONTRAST:  15 cc  of Gadavist FINDINGS: 1. Mildly dilated left ventricular size, mild LV thickness, moderately reduced LV systolic function (LVEF = 37%). There is global hypokinesis. There is diffuse midwall late gadolinium enhancement in the left ventricular myocardium. LVEDV: 235 ml LVESV: 149 ml SV: 86 ml CO: 5L/min Myocardial mass: 207g LV native T1 value 1104 ms (normal <1000 ms) LV ECV value 62%.  Normal <30%. 2. Normal right ventricular size, thickness, moderately reduced systolic function (RVEF = 30%). There is global hypokinesis. 3.  Mildly dilated left atrial size. normal right atrial size. 4. Normal size of the aortic  root, ascending aorta and pulmonary artery. 5. Mild mitral regurgitation, trivial pulmonary regurgitation. No significant valvular abnormalities. 6.  Normal pericardium.  trace pericardial effusion. IMPRESSION: 1. Mildly dilated LV, moderately reduced LV systolic function. LVEF 37%. 2.  Diffuse midwall LGE/scar 3. Elevated native T1 and ECV values suggesting infiltrative disease. 4.  Moderately reduced RV function. 5.  Trivial pericardial effusion 6. Findings suggest infiltrative cardiomyopathy. Recommend  complete workup for amyloidosis Electronically Signed   By: Debbe Odea M.D.   On: 12/25/2022 17:50     Data Reviewed: Relevant notes from primary care and specialist visits, past discharge summaries as available in EHR, including Care Everywhere. 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 amyloidosis) Lasix infusion as recommended by cardiology Continued GDMT if BP allows-spironolactone and metoprolol Daily weights with intake and output monitoring echocardiogram-last EF 35 to 40% 03/2022 Will get repeat echo  CAD with hx of STEMI 2018 S/P RCA stent Elevated troponin, suspect demand ischemia Patient denies chest pain and EKG nonacute Continue to trend troponin Received aspirin 324 mg in the ED Continue daily aspirin Echo to evaluate for wall motion abnormality Cardiology consult  Hypotension Systolic in the 80s at heart failure clinic, 103 on arrival to the ED Received midodrine 10 mg in the ED Will continue midodrine at 10 mg 3 times daily  Obesity, Class III, BMI  40-49.9 (morbid obesity) (HCC) Complicating factor to overall prognosis and care  OSA (obstructive sleep apnea) Currently awaiting delivery of home CPAP CPAP nightly as needed  Diabetes mellitus (HCC) Sliding scale insulin coverage     DVT prophylaxis: Lovenox  Consults: [facility] cardiology  Advance Care Planning:   Code Status: Prior   Family Communication: none  Disposition Plan: Back to previous home environment  Severity of Illness: The appropriate patient status for this patient is INPATIENT. Inpatient status is judged to be reasonable and necessary in order to provide the required intensity of service to ensure the patient's safety. The patient's presenting symptoms, physical exam findings, and initial radiographic and laboratory data in the context of their chronic comorbidities is felt to place them at high risk for further clinical deterioration. Furthermore, it is not anticipated that the patient will be medically stable for discharge from the hospital within 2 midnights of admission.   * I certify that at the point of admission it is my clinical judgment that the patient will require inpatient hospital care spanning beyond 2 midnights from the point of admission due to high intensity of service, high risk for further deterioration and high frequency of surveillance required.*  Author: Andris Baumann, MD 12/26/2022 10:01 PM  For on call review [email or web address].

## 2022-12-26 NOTE — Addendum Note (Signed)
Addended by: Arvilla Meres R on: 12/26/2022 11:57 AM   Modules accepted: Level of Service

## 2022-12-26 NOTE — ED Notes (Signed)
Fuller Plan, MD notified of critical troponin of 144.

## 2022-12-26 NOTE — ED Notes (Signed)
Reviewed pt's chart.Marland KitchenMarland KitchenMarland KitchenMarland Kitchenprotocols added

## 2022-12-26 NOTE — Progress Notes (Addendum)
PCP: Darreld Mclean, MD (last seen few months ago) Primary Cardiologist: Dorothyann Peng, MD (last seen 11/24; returns 02/25)   HPI:  [name] is a 52 y/o male with a history of CAD (MI 2004/ 2007), DM2, hyperlipidemia, HTN, OSA, tobacco use and chronic heart failure. Reports CAD with NSTEMI in 2004 and 2007. Had inferior STEMI in 6/18. Cath with LAD 20% LCx 50%d. OM4 75% RCA 100%m  -> PCI with 2 overlapping DES stents  Cardiac history dates back to 2004. Prior inferior MI in 2004 treated with thrombolytics and MI in 2007 treated with stenting of the RCA. Normal EF at that time. He was apparently placed on full disability at the time of one of these MIs for unclear reasons.   Echo 09/27/08: EF 60-65% with mild LAE Echo 07/05/16: EF 50-55% Echo 08/15/20: EF 45-50%  Was in the ED 03/19/22 due to SOB and weight gain. D dimer elevated so CTA done but no PE or pneumonia. Was in the ED 04/14/22 due to a/c HF. Ultrasound negative for DVT. Diuresed with improvement of symptoms. Was in the ED 05/30/22 due to a tick bite on his back. Found to be hypokalemic/ hypocalcemic. Given IV potassium, calcium gluconate and small fluid bolus.   Echo 04/17/22 EF 35-40% moderate LVH RV low normal. Dr. Juliann Pares ordered cMRI to further evalaute  cMRI 12/25/22: 1. Mildly dilated LV, moderately reduced LV systolic function. LVEF 37%.  2.  Diffuse midwall LGE/scar 3. Elevated native T1 and ECV values suggesting infiltrative disease. 4. Moderately reduced RV function. 5.Trivial pericardial effusion 6. Findings suggest infiltrative cardiomyopathy. Recommend complete workup for amyloidosis   He presents today for a HF f/u visit with a chief complaint of moderate SOB with minimal exertion. Has associated fatigue, dizziness, occasional knot in his left thigh and worsening edema. He says that he feels so SOB that he valet parked his car for the first time yesterday and again today. He has swelling from his toes up to his abdomen. His  wife says that even his genital area is swollen. Denies chest pain or palpitations. Has recently been wearing bipap with humidifier. [place] Spaniel it was working well until recently where he feels like the pressures are too high and he has difficulty breathing out with bipap mask on.   Spironolactone on hold due to low BP. He stopped taking jardiance 25mg  because he says that his glucose was getting too low.   Took 2 doses of furoscix (with extra potassium) since last visit but he says that he didn't urinate much with either dose. Has been wearing compression socks which helped a little with the swelling to start with but now the swelling seems worse.   Yesterday he says that he drank a 20 ounce soda along with 2 large fountain drinks. No water that he can recall. Doesn't feel like his diet was any different since he was last here.   Previous cardiac studies:   Stress test 07/2020: Abnormal myocardial perfusion scan. Mildly depressed left ventricular function mild inferior hypoejection fraction 45% there is evidence of what appears to be fixed defect in the inferior segment but no evidence of reversibility to segment mid represent scar versus artifact.   LHC 07/05/16: Mid RCA to Dist RCA lesion, 100 %stenosed. RPDA lesion, 90 %stenosed. Dist Cx lesion, 50 %stenosed. 4th Mrg lesion, 75 %stenosed. Mid LAD to Dist LAD lesion, 20 %stenosed. A STENT XIENCE ALPINE RX [id number] drug eluting stent was successfully placed, and overlaps previously placed stent. A STENT  XIENCE ALPINE RX [id number] stent was successfully placed. Dist RCA lesion, 100 %stenosed. Post intervention, there is a 0% residual stenosis. There is mild left ventricular systolic dysfunction. LV end diastolic pressure is normal. The left ventricular ejection fraction is 45-50% by visual estimate.  Conclusion STEMI inferior wall Successful PCI stent to mid RCA deployment of 2 overlapping DES stents reducing lesion from 90% down to 0 Diagnostic  cardiac catheter of the left system which showed mild to moderate diffuse disease   ROS: All systems negative except as listed in HPI, PMH and Problem List.  SH:  Social History   Socioeconomic History   Marital status: Married    Spouse name: Not on file   Number of children: Not on file   Years of education: Not on file   Highest education level: Not on file  Occupational History   Not on file  Tobacco Use   Smoking status: Every Day    Current packs/day: 0.00    Average packs/day: 1 pack/day for 15.0 years (15.0 ttl pk-yrs)    Types: Cigars, Cigarettes    Start date: 2005    Last attempt to quit: 2020    Years since quitting: 4.9   Smokeless tobacco: Never  Vaping Use   Vaping status: Never Used  Substance and Sexual Activity   Alcohol use: No   Drug use: No    Comment: prior hx of cocaine and marijuana   Sexual activity: Not on file  Other Topics Concern   Not on file  Social History Narrative   Not on file   Social Determinants of Health   Financial Resource Strain: Not on file  Food Insecurity: Not on file  Transportation Needs: Not on file  Physical Activity: Not on file  Stress: Not on file  Social Connections: Not on file  Intimate Partner Violence: Not on file    FH:  Family History  Adopted: Yes    Past Medical History:  Diagnosis Date   CHF (congestive heart failure) (HCC)    Coronary artery disease    Inferior myocardial infarction in 2004.  He was treated with thrombolytics at [facility].  He then had an inferior MI in March 2007.  He received a Horizon study stent 3.5 x 20 mm in the distal RCA at [facility].  No other obstructive disease.   Diabetes mellitus    TYPE II   Erectile dysfunction    Hyperlipidemia    Hypertension    MI (myocardial infarction) (HCC) 1610,9604   Morbid obesity (HCC)    Sleep apnea     Current Outpatient Medications  Medication Sig Dispense Refill   clopidogrel (PLAVIX) 75 MG tablet Take 75 mg by mouth daily.      metoprolol succinate (TOPROL-XL) 25 MG 24 hr tablet Take 0.5 tablets (12.5 mg total) by mouth daily. Take with or immediately following a meal. 45 tablet 2   nitroGLYCERIN (NITROSTAT) 0.4 MG SL tablet Place 1 tablet (0.4 mg total) under the tongue every 5 (five) minutes as needed for chest pain. 30 tablet 0   potassium chloride SA (KLOR-CON M) 20 MEQ tablet Take 1 tablet (20 mEq total) by mouth 3 (three) times a week.     torsemide (DEMADEX) 20 MG tablet Take 2 tablets (40 mg total) by mouth 2 (two) times daily.     AMBULATORY NON FORMULARY MEDICATION Trimix (30/1/10)-(Pap/Phent/PGE)  Test Dose  1ml vial   Qty #3 Refills 0  [facility] [phone] Fax [phone]  Patient not taking: Reported on 12/26/2022) 3 mL 0   Dulaglutide (TRULICITY) 1.5 MG/0.5ML SOPN Inject into the skin. (Patient not taking: Reported on 12/26/2022)     empagliflozin (JARDIANCE) 25 MG TABS tablet Take 25 mg by mouth daily. (Patient not taking: Reported on 12/26/2022)     spironolactone (ALDACTONE) 25 MG tablet Take 1 tablet (25 mg total) by mouth daily. (Patient not taking: Reported on 12/26/2022) 30 tablet 5   TRADJENTA 5 MG TABS tablet Take 5 mg by mouth daily. (Patient not taking: Reported on 12/26/2022)     traMADol (ULTRAM) 50 MG tablet Take 50 mg by mouth every 6 (six) hours as needed for pain. (Patient not taking: Reported on 12/26/2022)     No current facility-administered medications for this visit.   Vitals:   12/26/22 0910 12/26/22 0912  BP: (!) 88/71 (!) 84/62  Pulse: 83   SpO2: 100%   Weight: 281 lb 8 oz (127.7 kg)    Wt Readings from Last 3 Encounters:  12/26/22 281 lb 8 oz (127.7 kg)  12/17/22 267 lb (121.1 kg)  12/10/22 267 lb (121.1 kg)   Lab Results  Component Value Date   CREATININE 0.94 12/17/2022   CREATININE 0.90 11/28/2022   CREATININE 0.92 11/19/2022   PHYSICAL EXAM:  General:  Well appearing. No resp difficulty HEENT: normal Neck: supple. JVP elevated No  lymphadenopathy or thryomegaly appreciated. Cor: PMI normal. Regular rate & rhythm. No rubs, gallops or murmurs. Lungs: clear Abdomen: soft, nontender, distended. No hepatosplenomegaly. No bruits or masses.  Extremities: no cyanosis, clubbing, rash, 3+ pitting edema bilaterally from toes to abdomen Neuro: alert & oriented x3, cranial nerves grossly intact. Moves all 4 extremities w/o difficulty. Affect pleasant.  ECG: NSR, low voltage HR 79 (personally reviewed)  Reds: 41%   ASSESSMENT & PLAN:  1: Acute on chronic systolic HF with R>L symptoms - NYHA class IV - fluid overloaded with elevated ReDs, weight gain and worsening symptoms - cMRI yesterday suggestive of amyloid with ECV 62% - weighing daily; reminded to call for an overnight weight gain of > 2 pounds or a weekly weight gain of > 5 pounds - weight up 14.8 pounds from last visit here 1 week ago - ReDs 41% - EKG today with low voltage, HR 79 - s/p Inferior STEMI in 6/18. Cath with LAD 20% LCx 50%d. OM4 75% RCA 100%m  -> PCI with 2 overlapping DES stents - Myoview 07/22: EF45% inferior scar. No ischemia  - Echo 09/27/08: EF 60-65% with mild LAE - Echo 07/05/16: EF 50-55% - Echo 08/15/20 EF 45-50% - Echo 04/17/22 EF 35-40% moderate LVH RV low normal - cMRI 12/25/22: LVEF 37%. Diffuse midwall LGE/scar Elevated native T1 and ECV values suggesting infiltrative disease. ECV 62%Moderately reduced RV function. - had been wearing compression socks but they no longer help - recommended ED for fluid removal and workup of amyloid - proBNP 12/17/22 was 6461  2: HTN- - BP 88/70 & recheck 84/62 - sees PCP Marvis Moeller)  - BMP 12/17/22 showed sodium 141, potassium 4.8, creatinine 0.94 & GFR 98  3. OSA- - CPAP titration has been completed and he's now waiting on equipment to be delivered - lowest O2 sat was 84.0% - now wearing bipap  4. DM2 - A1c 7.3% in 3/24 (primary care) - patient self holding jardiance due to low glucose   5: CAD  - saw  cardiology Juliann Pares) 11/24 - continue clopidogrel 75mg  daily - lipitor has been stopped and will start  crestor 01/25 - LDL 11/19/22 was 172 - Cath 06/2016 with LAD 20% LCx 50%d. OM4 75% RCA 100%m  -> PCI with 2 overlapping DES stents  6. Tobacco use - smoking 5-6 cigars per day - encouraged cessation - at one time in the past, he had quit for 5  years but unfortunately, resume  7: Leg weakness- - sees vascular (Schnier) 12/24 - lipitor has been stopped and will start crestor 01/25   Consulted with Dr. Gala Romney who also assessed patient. Discussed cMRI results with patient and his wife with recommendation of going to the ED for fluid removal with lasix drip, midodrine to increase BP, leg wrapping and rapid workup of amyloid. Dr Gala Romney spoke with Dr. Juliann Pares over the phone to update him along with his recommendations. Patient says that he will return to the ED this afternoon after he picks up his 85 y/o granddaughter from school.   Clarisa Kindred, NP-C  Patient seen and examined with the above-signed Advanced Practice Provider and/or Housestaff. I personally reviewed laboratory data, imaging studies and relevant notes. I independently examined the patient and formulated the important aspects of the plan. I have edited the note to reflect any of my changes or salient points. I have personally discussed the plan with the patient and/or family.  52 y/o male as above with h/o CAD and previous RCA infarct. EF 50-55%   Over past few weeks/months with progressive HF symptoms. Now NYHA IV with refractory volume overload not responding to oral diuretics and hypotension.   - Echo 04/17/22 EF 35-40% moderate LVH RV low normal - cMRI 12/25/22: LVEF 37%. Diffuse midwall LGE/scar Elevated native T1 and ECV values suggesting infiltrative disease. ECV 62%Moderately reduced RV function.  ECG with low volts  General:  Fatigued appearing. No resp difficulty HEENT: normal Neck: supple. JVP 10 Carotids 2+  bilat; no bruits. No lymphadenopathy or thryomegaly appreciated. VFI:EPPIRJJ rate & rhythm. No rubs, gallops or murmurs. Lungs: clear Abdomen: obese soft, nontender, + distended. No hepatosplenomegaly. No bruits or masses. Good bowel sounds. Extremities: no cyanosis, clubbing, rash, 3+ edema Neuro: alert & orientedx3, cranial nerves grossly intact. moves all 4 extremities w/o difficulty. Affect pleasant  He has advanced systolic HF with R>L symptoms and marked volume overload and hypotension.  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 Given age and rapid course, I am concerned for AL amyloidosis.   Will need to be admitted to BP support, IV diuresis and w/u for AL amyloid/myeloma.   I d/w Dr. Juliann Pares who agrees. The AHF team will follow as well and assist as we can with his management.  Recs:  1. Admit 2. Start midodrine 5 tid for BP support. Titrate as needed 3. Start lasix 120 IV bid and adjust as needed 4. UNNA boots 5. Check SPEP/UPEP and serum free light chains 6. Skeletal survey  Arvilla Meres, MD  11:56 AM

## 2022-12-26 NOTE — Progress Notes (Signed)
Anticoagulation monitoring(Lovenox):  52 yo male ordered Lovenox 40 mg Q24h    Filed Weights   12/26/22 1610  Weight: 127.5 kg (281 lb)   BMI 39.2   Lab Results  Component Value Date   CREATININE 1.18 12/26/2022   CREATININE 0.94 12/17/2022   CREATININE 0.90 11/28/2022   Estimated Creatinine Clearance: 99.6 mL/min (by C-G formula based on SCr of 1.18 mg/dL). Hemoglobin & Hematocrit     Component Value Date/Time   HGB 15.5 12/26/2022 1612   HCT 47.2 12/26/2022 1612     Per Protocol for Patient with estCrcl > 30 ml/min and BMI > 30, will transition to Lovenox 65 mg Q24h.

## 2022-12-26 NOTE — ED Triage Notes (Signed)
Patient to ED via POV- sent by heart failure clinic for admission. Pt reports gaining 20 lbs in one week- still taking torsemide as prescribed. Having increased SOB. States he had a scan done yesterday that showed protein around his heart and is getting testing done for possible bone cancer. Ambulatory to triage. NAD noted.

## 2022-12-26 NOTE — Progress Notes (Signed)
ReDS Vest / Clip - 12/26/22 0910       ReDS Vest / Clip   Station Marker C    Ruler Value 30    ReDS Value Range High volume overload    ReDS Actual Value 41

## 2022-12-26 NOTE — Assessment & Plan Note (Signed)
Currently awaiting delivery of home CPAP CPAP nightly as needed

## 2022-12-26 NOTE — Assessment & Plan Note (Signed)
Sliding scale insulin coverage 

## 2022-12-26 NOTE — ED Notes (Signed)
Lab reports troponin results; acuity level changed; charge nurse notified and pt taken to room 7 by [facility] RN for further evaluation

## 2022-12-26 NOTE — ED Provider Notes (Signed)
[facility] Provider Note    Event Date/Time   First MD Initiated Contact with Patient 12/26/22 2040     (approximate)   History   Shortness of Breath   HPI  Jesse [name] is a 52 y.o. male with CAD (MI 2004/ 2007), DM2, hyperlipidemia, HTN, OSA, tobacco use and chronic heart failure. Reports CAD with NSTEMI in 2004 and 2007. Had inferior STEMI in 6/18. Cath with LAD 20% LCx 50%d. OM4 75% RCA 100%m -> PCI with 2 overlapping DES stents who comes in with concerns for fluid overload.  Patient was told that he might have amyloidosis.  He was sent to the ER for further workup of this as well as IV diuresis.  He currently denies any chest pain.  He reports some shortness of breath but states that it feels pretty okay at this time.  They recommended using some midodrine to help increase blood pressure while patient is being diuresed.  She denies any falls in his head      Physical Exam   Triage Vital Signs: ED Triage Vitals [12/26/22 1610]  Encounter Vitals Group     BP 103/76     Systolic BP Percentile      Diastolic BP Percentile      Pulse Rate 93     Resp 18     Temp 98 F (36.7 C)     Temp Source Oral     SpO2 100 %     Weight 281 lb (127.5 kg)     Height 5\' 11"  (1.803 m)     Head Circumference      Peak Flow      Pain Score 0     Pain Loc      Pain Education      Exclude from Growth Chart     Most recent vital signs: Vitals:   12/26/22 1610  BP: 103/76  Pulse: 93  Resp: 18  Temp: 98 F (36.7 C)  SpO2: 100%     General: Awake, no distress.  CV:  Good peripheral perfusion.  Resp:  Normal effort.  Abd:  Positive distention Other:  Swelling noted in bilateral legs Warm and well-perfused  ED Results / Procedures / Treatments   Labs (all labs ordered are listed, but only abnormal results are displayed) Labs Reviewed  BASIC METABOLIC PANEL - Abnormal; Notable for the following components:      Result Value   Sodium 134 (*)     Chloride 96 (*)    Glucose, Bld 193 (*)    BUN 33 (*)    Calcium 7.9 (*)    All other components within normal limits  CBC - Abnormal; Notable for the following components:   RDW 16.4 (*)    Platelets 428 (*)    All other components within normal limits  BRAIN NATRIURETIC PEPTIDE - Abnormal; Notable for the following components:   B Natriuretic Peptide 827.9 (*)    All other components within normal limits  TROPONIN I (HIGH SENSITIVITY) - Abnormal; Notable for the following components:   Troponin I (High Sensitivity) 144 (*)    All other components within normal limits  PROTIME-INR  TROPONIN I (HIGH SENSITIVITY)     EKG  My interpretation of EKG:  Sinus rate of 92 with T wave inversions in the inferior lateral leads with PVC and left bundle branch block.  Reviewed and similar to prior EKG  RADIOLOGY I have reviewed the xray personally and interpreted  and some edema  PROCEDURES:  Critical Care performed: Yes, see critical care procedure note(s)  .Critical Care  Performed by: Concha Se, MD Authorized by: Concha Se, MD   Critical care provider statement:    Critical care time (minutes):  30   Critical care was necessary to treat or prevent imminent or life-threatening deterioration of the following conditions:  Cardiac failure   Critical care was time spent personally by me on the following activities:  Development of treatment plan with patient or surrogate, discussions with consultants, evaluation of patient's response to treatment, examination of patient, ordering and review of laboratory studies, ordering and review of radiographic studies, ordering and performing treatments and interventions, pulse oximetry, re-evaluation of patient's condition and review of old charts .1-3 Lead EKG Interpretation  Performed by: Concha Se, MD Authorized by: Concha Se, MD     Interpretation: normal     ECG rate:  90   ECG rate assessment: normal     Rhythm: sinus  rhythm     Ectopy: PVCs     Conduction: normal      MEDICATIONS ORDERED IN ED: Medications  midodrine (PROAMATINE) tablet 5 mg (has no administration in time range)  furosemide (LASIX) injection 40 mg (has no administration in time range)     IMPRESSION / MDM / ASSESSMENT AND PLAN / ED COURSE  I reviewed the triage vital signs and the nursing notes.   Patient's presentation is most consistent with acute presentation with potential threat to life or bodily function.   Patient comes in with concern for fluid overload.  No unilateral leg swelling to suggest DVT.  Blood work, x-ray consistent with CHF.  Elevated troponin most likely demand from CHF.  He denies any active chest pain.  Will given aspirin dose and continue to trend these out but will hold off on heparin at this time.  Will give some midodrine to help with blood pressure and diurese patient per cardiology's note.  Will discuss the hospital team for admission.  He is warm and well-perfused do not feel he is in cardiogenic shock at this time  The patient is on the cardiac monitor to evaluate for evidence of arrhythmia and/or significant heart rate changes.      FINAL CLINICAL IMPRESSION(S) / ED DIAGNOSES   Final diagnoses:  Acute on chronic congestive heart failure, unspecified heart failure type (HCC)     Rx / DC Orders   ED Discharge Orders     None        Note:  This document was prepared using Dragon voice recognition software and may include unintentional dictation errors.   Concha Se, MD 12/26/22 2056

## 2022-12-26 NOTE — Assessment & Plan Note (Signed)
Complicating factor to overall prognosis and care 

## 2022-12-26 NOTE — Assessment & Plan Note (Signed)
Systolic in the 80s at heart failure clinic, 103 on arrival to the ED Received midodrine 10 mg in the ED Will continue midodrine at 10 mg 3 times daily

## 2022-12-27 ENCOUNTER — Encounter: Payer: Self-pay | Admitting: Internal Medicine

## 2022-12-27 ENCOUNTER — Inpatient Hospital Stay: Payer: 59

## 2022-12-27 ENCOUNTER — Inpatient Hospital Stay (HOSPITAL_COMMUNITY)
Admit: 2022-12-27 | Discharge: 2022-12-27 | Disposition: A | Discharge status: Home / Self Care | Payer: 59 | Attending: Internal Medicine

## 2022-12-27 DIAGNOSIS — I5023 Acute on chronic systolic (congestive) heart failure: Secondary | ICD-10-CM | POA: Diagnosis not present

## 2022-12-27 DIAGNOSIS — I5030 Unspecified diastolic (congestive) heart failure: Secondary | ICD-10-CM

## 2022-12-27 DIAGNOSIS — I517 Cardiomegaly: Secondary | ICD-10-CM

## 2022-12-27 DIAGNOSIS — I081 Rheumatic disorders of both mitral and tricuspid valves: Secondary | ICD-10-CM

## 2022-12-27 LAB — ECHOCARDIOGRAM COMPLETE
AR max vel: 2.9 cm2
AV Area VTI: 2.15 cm2
AV Area mean vel: 2.23 cm2
AV Mean grad: 2 mm[Hg]
AV Peak grad: 3.1 mm[Hg]
Ao pk vel: 0.88 m/s
Area-P 1/2: 3.99 cm2
Calc EF: 32.5 %
Height: 71 in
MV VTI: 1.17 cm2
S' Lateral: 4.1 cm
Single Plane A2C EF: 28 %
Single Plane A4C EF: 37.5 %
Weight: 4496 [oz_av]

## 2022-12-27 LAB — CBG MONITORING, ED
Glucose-Capillary: 118 mg/dL — ABNORMAL HIGH (ref 70–99)
Glucose-Capillary: 182 mg/dL — ABNORMAL HIGH (ref 70–99)
Glucose-Capillary: 137 mg/dL — ABNORMAL HIGH (ref 70–99)
Glucose-Capillary: 146 mg/dL — ABNORMAL HIGH (ref 70–99)

## 2022-12-27 LAB — HIV ANTIBODY (ROUTINE TESTING W REFLEX): HIV Screen 4th Generation wRfx: NONREACTIVE

## 2022-12-27 LAB — HEMOGLOBIN A1C
Hgb A1c MFr Bld: 7.7 % — ABNORMAL HIGH (ref 4.8–5.6)
Mean Plasma Glucose: 174.29 mg/dL

## 2022-12-27 MED ORDER — FUROSEMIDE 10 MG/ML IJ SOLN
160.0000 mg | Freq: Once | INTRAVENOUS | Status: AC
  Administered 2022-12-27: 160 mg via INTRAVENOUS
  Filled 2022-12-27: qty 16

## 2022-12-27 MED ORDER — TRAMADOL HCL 50 MG PO TABS
50.0000 mg | ORAL_TABLET | Freq: Four times a day (QID) | ORAL | Status: DC | PRN
  Administered 2022-12-27: 50 mg via ORAL
  Filled 2022-12-27 (×2): qty 1

## 2022-12-27 MED ORDER — GADOBUTROL 1 MMOL/ML IV SOLN
10.0000 mL | Freq: Once | INTRAVENOUS | Status: AC | PRN
Start: 2022-12-27 — End: 2022-12-27
  Administered 2022-12-27: 10 mL via INTRAVENOUS

## 2022-12-27 MED ORDER — METOLAZONE 2.5 MG PO TABS
2.5000 mg | ORAL_TABLET | Freq: Once | ORAL | Status: AC
  Administered 2022-12-27: 2.5 mg via ORAL
  Filled 2022-12-27: qty 1

## 2022-12-27 MED ORDER — CLOPIDOGREL BISULFATE 75 MG PO TABS
75.0000 mg | ORAL_TABLET | Freq: Every day | ORAL | Status: DC
  Administered 2022-12-27 – 2022-12-31 (×5): 75 mg via ORAL
  Filled 2022-12-27 (×5): qty 1

## 2022-12-27 MED ORDER — METOLAZONE 5 MG PO TABS
5.0000 mg | ORAL_TABLET | Freq: Once | ORAL | Status: AC
  Administered 2022-12-27: 5 mg via ORAL
  Filled 2022-12-27 (×2): qty 1

## 2022-12-27 NOTE — Progress Notes (Signed)
*  PRELIMINARY RESULTS* Echocardiogram 2D Echocardiogram has been performed.  Carolyne Fiscal 12/27/2022, 10:33 AM

## 2022-12-27 NOTE — Progress Notes (Signed)
Advanced Heart Failure Rounding Note   Subjective:    Has not diuresed well overnight in ER despite IV lasix.   Remains frustrated. Denies SOB at rest.    Objective:   Weight Range:  Vital Signs:   Temp:  [97.1 F (36.2 C)-98.2 F (36.8 C)] 98.2 F (36.8 C) (12/06 0515) Pulse Rate:  [74-93] 80 (12/06 1000) Resp:  [8-23] 19 (12/06 0900) BP: (92-136)/(73-113) 96/79 (12/06 1000) SpO2:  [89 %-100 %] 97 % (12/06 0515) FiO2 (%):  [21 %] 21 % (12/06 0147) Weight:  [127.5 kg] 127.5 kg (12/05 1610) Last BM Date : 12/26/22  Weight change: Filed Weights   12/26/22 1610  Weight: 127.5 kg    Intake/Output:   Intake/Output Summary (Last 24 hours) at 12/27/2022 1020 Last data filed at 12/27/2022 5956 Gross per 24 hour  Intake 33.51 ml  Output --  Net 33.51 ml     Physical Exam: General:  Sitting up in bed No resp difficulty HEENT: normal Neck: supple. JVP to ear . Carotids 2+ bilat; no bruits. No lymphadenopathy or thryomegaly appreciated. Cor: Regular rate & rhythm. No rubs, gallops or murmurs. Lungs: clear Abdomen: Obese nontender,+ distended. No hepatosplenomegaly. No bruits or masses. Good bowel sounds. Extremities: no cyanosis, clubbing, rash, 3+ edema Neuro: alert & orientedx3, cranial nerves grossly intact. moves all 4 extremities w/o difficulty. Affect pleasant  Telemetry: Sinus 80s Personally reviewed  Labs: Basic Metabolic Panel: Recent Labs  Lab 12/26/22 1612  NA 134*  K 3.7  CL 96*  CO2 28  GLUCOSE 193*  BUN 33*  CREATININE 1.18  CALCIUM 7.9*    Liver Function Tests: No results for input(s): "AST", "ALT", "ALKPHOS", "BILITOT", "PROT", "ALBUMIN" in the last 168 hours. No results for input(s): "LIPASE", "AMYLASE" in the last 168 hours. No results for input(s): "AMMONIA" in the last 168 hours.  CBC: Recent Labs  Lab 12/26/22 1612  WBC 9.8  HGB 15.5  HCT 47.2  MCV 83.8  PLT 428*    Cardiac Enzymes: No results for input(s): "CKTOTAL",  "CKMB", "CKMBINDEX", "TROPONINI" in the last 168 hours.  BNP: BNP (last 3 results) Recent Labs    10/14/22 1326 10/17/22 0951 12/26/22 1619  BNP 534.9* 483.5* 827.9*    ProBNP (last 3 results) Recent Labs    12/17/22 1159  PROBNP 6,461*      Other results:  Imaging: DG Chest 2 View  Result Date: 12/26/2022 CLINICAL DATA:  Chest pain. EXAM: CHEST - 2 VIEW COMPARISON:  05/30/2022. FINDINGS: Low lung volume. There are diffuse increased interstitial markings when compared to the prior exam. Bilateral lung fields are otherwise clear. No acute consolidation or lung collapse. Bilateral costophrenic angles are clear. Stable cardio-mediastinal silhouette. No acute osseous abnormalities. The soft tissues are within normal limits. IMPRESSION: *In appropriate clinical setting, findings favor mild congestive heart failure/pulmonary edema. Electronically Signed   By: Jules Schick M.D.   On: 12/26/2022 18:28   MR CARDIAC MORPHOLOGY W WO CONTRAST  Result Date: 12/25/2022 CLINICAL DATA:  Cardiomyopathy, hx of CAD/PCI. Eval for infiltrative process. EXAM: CARDIAC MRI TECHNIQUE: The patient was scanned on a 1.5 Tesla Siemens magnet. A dedicated cardiac coil was used. Functional imaging was done using Fiesta sequences. 2,3, and 4 chamber views were done to assess for RWMA's. Modified Simpson's rule using a short axis stack was used to calculate an ejection fraction on a dedicated work [employer]. The patient received 15 cc of Gadavist. After 10 minutes inversion recovery  sequences were used to assess for infiltration and scar tissue. Velocity flow mapping performed in the ascending aorta and main pulmonary artery. CONTRAST:  15 cc  of Gadavist FINDINGS: 1. Mildly dilated left ventricular size, mild LV thickness, moderately reduced LV systolic function (LVEF = 37%). There is global hypokinesis. There is diffuse midwall late gadolinium enhancement in the left ventricular myocardium.  LVEDV: 235 ml LVESV: 149 ml SV: 86 ml CO: 5L/min Myocardial mass: 207g LV native T1 value 1104 ms (normal <1000 ms) LV ECV value 62%.  Normal <30%. 2. Normal right ventricular size, thickness, moderately reduced systolic function (RVEF = 30%). There is global hypokinesis. 3.  Mildly dilated left atrial size. normal right atrial size. 4. Normal size of the aortic root, ascending aorta and pulmonary artery. 5. Mild mitral regurgitation, trivial pulmonary regurgitation. No significant valvular abnormalities. 6.  Normal pericardium.  trace pericardial effusion. IMPRESSION: 1. Mildly dilated LV, moderately reduced LV systolic function. LVEF 37%. 2.  Diffuse midwall LGE/scar 3. Elevated native T1 and ECV values suggesting infiltrative disease. 4.  Moderately reduced RV function. 5.  Trivial pericardial effusion 6. Findings suggest infiltrative cardiomyopathy. Recommend complete workup for amyloidosis Electronically Signed   By: Debbe Odea M.D.   On: 12/25/2022 17:50   MR CARDIAC VELOCITY FLOW MAP  Result Date: 12/25/2022 CLINICAL DATA:  Cardiomyopathy, hx of CAD/PCI. Eval for infiltrative process. EXAM: CARDIAC MRI TECHNIQUE: The patient was scanned on a 1.5 Tesla Siemens magnet. A dedicated cardiac coil was used. Functional imaging was done using Fiesta sequences. 2,3, and 4 chamber views were done to assess for RWMA's. Modified Simpson's rule using a short axis stack was used to calculate an ejection fraction on a dedicated work [employer], [employer]. The patient received 15 cc of Gadavist. After 10 minutes inversion recovery sequences were used to assess for infiltration and scar tissue. Velocity flow mapping performed in the ascending aorta and main pulmonary artery. CONTRAST:  15 cc  of Gadavist FINDINGS: 1. Mildly dilated left ventricular size, mild LV thickness, moderately reduced LV systolic function (LVEF = 37%). There is global hypokinesis. There is diffuse midwall late gadolinium enhancement  in the left ventricular myocardium. LVEDV: 235 ml LVESV: 149 ml SV: 86 ml CO: 5L/min Myocardial mass: 207g LV native T1 value 1104 ms (normal <1000 ms) LV ECV value 62%.  Normal <30%. 2. Normal right ventricular size, thickness, moderately reduced systolic function (RVEF = 30%). There is global hypokinesis. 3.  Mildly dilated left atrial size. normal right atrial size. 4. Normal size of the aortic root, ascending aorta and pulmonary artery. 5. Mild mitral regurgitation, trivial pulmonary regurgitation. No significant valvular abnormalities. 6.  Normal pericardium.  trace pericardial effusion. IMPRESSION: 1. Mildly dilated LV, moderately reduced LV systolic function. LVEF 37%. 2.  Diffuse midwall LGE/scar 3. Elevated native T1 and ECV values suggesting infiltrative disease. 4.  Moderately reduced RV function. 5.  Trivial pericardial effusion 6. Findings suggest infiltrative cardiomyopathy. Recommend complete workup for amyloidosis Electronically Signed   By: Debbe Odea M.D.   On: 12/25/2022 17:50   MR CARDIAC VELOCITY FLOW MAP  Result Date: 12/25/2022 CLINICAL DATA:  Cardiomyopathy, hx of CAD/PCI. Eval for infiltrative process. EXAM: CARDIAC MRI TECHNIQUE: The patient was scanned on a 1.5 Tesla Siemens magnet. A dedicated cardiac coil was used. Functional imaging was done using Fiesta sequences. 2,3, and 4 chamber views were done to assess for RWMA's. Modified Simpson's rule using a short axis stack was used to calculate an ejection fraction  on a dedicated work [employer] officer, [employer]. The patient received 15 cc of Gadavist. After 10 minutes inversion recovery sequences were used to assess for infiltration and scar tissue. Velocity flow mapping performed in the ascending aorta and main pulmonary artery. CONTRAST:  15 cc  of Gadavist FINDINGS: 1. Mildly dilated left ventricular size, mild LV thickness, moderately reduced LV systolic function (LVEF = 37%). There is global hypokinesis. There is diffuse  midwall late gadolinium enhancement in the left ventricular myocardium. LVEDV: 235 ml LVESV: 149 ml SV: 86 ml CO: 5L/min Myocardial mass: 207g LV native T1 value 1104 ms (normal <1000 ms) LV ECV value 62%.  Normal <30%. 2. Normal right ventricular size, thickness, moderately reduced systolic function (RVEF = 30%). There is global hypokinesis. 3.  Mildly dilated left atrial size. normal right atrial size. 4. Normal size of the aortic root, ascending aorta and pulmonary artery. 5. Mild mitral regurgitation, trivial pulmonary regurgitation. No significant valvular abnormalities. 6.  Normal pericardium.  trace pericardial effusion. IMPRESSION: 1. Mildly dilated LV, moderately reduced LV systolic function. LVEF 37%. 2.  Diffuse midwall LGE/scar 3. Elevated native T1 and ECV values suggesting infiltrative disease. 4.  Moderately reduced RV function. 5.  Trivial pericardial effusion 6. Findings suggest infiltrative cardiomyopathy. Recommend complete workup for amyloidosis Electronically Signed   By: Debbe Odea M.D.   On: 12/25/2022 17:50     Medications:     Scheduled Medications:  aspirin EC  81 mg Oral Daily   clopidogrel  75 mg Oral Daily   enoxaparin (LOVENOX) injection  0.5 mg/kg Subcutaneous Q24H   insulin aspart  0-20 Units Subcutaneous TID WC   insulin aspart  0-5 Units Subcutaneous QHS   metolazone  2.5 mg Oral Once   midodrine  10 mg Oral TID WC    Infusions:  furosemide     furosemide (LASIX) 200 mg in dextrose 5 % 100 mL (2 mg/mL) infusion 20 mg/hr (12/27/22 0856)    PRN Medications: acetaminophen **OR** acetaminophen, albuterol, ondansetron **OR** ondansetron (ZOFRAN) IV, traMADol   Assessment/Plan:     ASSESSMENT & PLAN:   1: Acute on chronic systolic HF with R>L symptoms - s/p Inferior STEMI in 6/18. Cath with LAD 20% LCx 50%d. OM4 75% RCA 100%m  -> PCI with 2 overlapping DES stents - Myoview 07/22: EF45% inferior scar. No ischemia  - Echo 08/15/20 EF 45-50% - Echo  04/17/22 EF 35-40% moderate LVH RV low normal - cMRI 12/25/22: LVEF 37%. Diffuse midwall LGE/scar Elevated native T1 and ECV values suggesting infiltrative disease. ECV 62%Moderately reduced RV function. - NYHA class IV with R> L HF symptoms - markedly fluid overloaded with poor response to IV lasix  - cMRI suggestive of amyloid with ECV 62%. Given age and rapid course, I am concerned for AL amyloidosis.  - Not responding well to IV lasix - Increase lasix gtt to 30. Give metolazone - wrap legs - Check SPEP/UPEP and serum free light chains - repeat echo pending - Consider skeletal survey for possible myeloma   2. OSA- - CPAP titration has been completed and he's now waiting on equipment to be delivered   3. DM2 - A1c 7.3% in 3/24 (primary care)   4: CAD  - Follows with Dr. Juliann Pares - continue clopidogrel 75mg  daily - lipitor has been stopped and will start crestor 01/25 - no current s/s angina - LDL 11/19/22 was 172 - Cath 06/2016 with LAD 20% LCx 50%d. OM4 75% RCA 100%m  -> PCI with  2 overlapping DES stents   5. Tobacco use - smoking 5-6 cigars per day - encouraged cessation     Length of Stay: 1   Jesse [name] 12/27/2022, 10:20 AM  Advanced Heart Failure Team Pager [phone] (M-F; 7a - 4p)  Please contact [facility] for night-coverage after hours (4p -7a ) and weekends on [email or web address]

## 2022-12-27 NOTE — TOC Initial Note (Signed)
Transition of Care [facility]) - Initial/Assessment Note    Patient Details  Name: Jesse [name] MRN: [id number] Date of Birth: 1971-01-19  [facility] CM/SW Contact:    Marquita Palms, LCSW Phone Number: 12/27/2022, 3:43 PM  Clinical Narrative:                  CSW met with patient bedside. Patient reported he lives with his wife and grand baby. [facility] [place]. Patient reports he has a PCP that he needs to make an appointment. He reported that he is agreeable to home health to help him with strengthen. Patient reports he may have CA but he is waiting for test results. Patient not agreeable to SNF at this time. Patient may be admitting. No other needs at this time.       Patient Goals and CMS Choice            Expected Discharge Plan and Services                                              Prior Living Arrangements/Services                       Activities of Daily Living      Permission Sought/Granted                  Emotional Assessment              Admission diagnosis:  Fluid overload [E87.70] Patient Active Problem List   Diagnosis Date Noted   Cardiomyopathy, ischemic and infiltrative (HCC) 12/26/2022   Hypotension 12/26/2022   Acute on chronic systolic CHF (congestive heart failure) (HCC) 12/26/2022   Obesity, Class III, BMI 40-49.9 (morbid obesity) (HCC) 12/26/2022   Elevated troponin 12/26/2022   OSA (obstructive sleep apnea) 11/12/2022   Perirectal abscess 03/06/2019   NSTEMI (non-ST elevated myocardial infarction) (HCC) 02/28/2017   STEMI involving right coronary artery (HCC) 07/05/2016   Ischemic cardiomyopathy 07/05/2016   Polysubstance abuse (HCC) 07/05/2016   H/O noncompliance with medical treatment, presenting hazards to health 07/05/2016   Poorly controlled diabetes mellitus (HCC) 07/05/2016   Complete tear of right rotator cuff 09/16/2014   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 [place] [address] Phone: [phone] Fax: [phone]     Social Determinants of Health (SDOH) Social History: SDOH Screenings   Tobacco Use: High Risk (12/26/2022)   SDOH Interventions:     Readmission Risk Interventions     No data to display

## 2022-12-27 NOTE — Progress Notes (Signed)
Heart Failure Navigator Progress Note  Patient does not meet TOC criteria due to Advanced Heart Failure Team patient of Dr. Arvilla Meres, MD  Navigator will sign off at this time.   Roxy Horseman, RN, BSN [facility] Heart Failure Navigator Secure Chat Only

## 2022-12-27 NOTE — Progress Notes (Signed)
  PROGRESS NOTE    Jesse [name]  ONG:[id number] DOB: 1970/12/25 DOA: 12/26/2022 PCP: Leanna Sato, MD  [id number]  LOS: 1 day   Brief hospital course:   Assessment & Plan: Jesse [name] is a 52 y.o. 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  He was fluid overloaded and also hypotensive to 84/62.  With sats of 100%.    * Acute on chronic systolic CHF (congestive heart failure) (HCC) Cardiomyopathy, ischemic and infiltrative (cMRI 12/25/22 suggesting amyloidosis) --echocardiogram-last EF 35 to 40% 03/2022 --started on lasix gtt --follows with [facility].  Heart failure team assisting. Plan: --cont lasix gtt --bolus IV lasix 160 mg x1 today --metolazone --hold Toprol due to hypotension  Hypotension Systolic in the 80s at heart failure clinic, 103 on arrival to the ED Received midodrine 10 mg in the ED --cont midodrine 10 mg TID --hold Toprol due to hypotension  CAD with hx of STEMI 2018 S/P RCA stent --cont asa and plavix  Elevated troponin, suspect demand ischemia Patient denies chest pain and EKG nonacute Trop 144 and 202  Obesity, Class III, BMI 40-49.9 (morbid obesity) (HCC) Complicating factor to overall prognosis and care  OSA (obstructive sleep apnea) Currently awaiting delivery of home CPAP CPAP nightly as needed  Diabetes mellitus (HCC) --A1c 7.7 --BG has been within inpatient goals --d/c BG checks and SSI   DVT prophylaxis: Lovenox SQ Code Status: Full code  Family Communication:  Level of care: Progressive Dispo:   The patient is from: home Anticipated d/c is to: home Anticipated d/c date is: 2-3  days   Subjective and Interval History:  Pt reported no dyspnea, but swelling around abdomen and BLE.   Objective: Vitals:   12/27/22 1445 12/27/22 1500 12/27/22 1630 12/27/22 1700  BP:  102/77 113/74 93/76  Pulse:  80 87 81  Resp:      Temp: 97.6 F (36.4 C)     TempSrc: Oral     SpO2:      Weight:      Height:        Intake/Output Summary (Last 24 hours) at 12/27/2022 1825 Last data filed at 12/27/2022 1724 Gross per 24 hour  Intake 100.23 ml  Output 1450 ml  Net -1349.77 ml   Filed Weights   12/26/22 1610  Weight: 127.5 kg    Examination:   Constitutional: NAD, AAOx3 HEENT: conjunctivae and lids normal, EOMI CV: No cyanosis.   RESP: normal respiratory effort, on RA Extremities: tense edema in BLE SKIN: warm, dry Neuro: II - XII grossly intact.   Psych: Normal mood and affect.     Data Reviewed: I have personally reviewed labs and imaging studies  Time spent: 50 minutes  Darlin Priestly, MD [facility] If 7PM-7AM, please contact night-coverage 12/27/2022, 6:25 PM

## 2022-12-27 NOTE — ED Notes (Signed)
Patient to xray.

## 2022-12-27 NOTE — Consult Note (Signed)
[facility] CLINIC CARDIOLOGY CONSULT NOTE       Patient ID: Jesse [name] MRN: [id number] DOB/AGE: 1970/01/23 52 y.o.  Admit date: 12/26/2022 Referring Physician Dr. Lindajo Royal Primary Physician Leanna Sato, MD  Primary Cardiologist Dr. Dorothyann Peng Reason for Consultation AoCHF, amyloid workup  HPI: Jesse [name] is a 51 y.o. male  with a past medical history of CAD (MI 2004/2007, inf STEMI 2018 s/p overlapping DES to RCA), DM2, hyperlipidemia, HTN, OSA, tobacco use and chronic heart failure who presented to the ED on 12/26/2022 for fluid overload. Cardiology was consulted for further evaluation.   Patient reports that over the last few weeks he has had worsening swelling all over his body. Denies any significant worsening in his baseline DOE. Main concern has been leg and abdominal edema. He was seen by Clarisa Kindred at [facility] yesterday and was advised to come to the ED for further evaluation. Workup in the ED notable for creatinine 1.18, K 3.7, hgb 15.5, WBC 9.8. BNP 827, troponins 144 > 202. He was started on a lasix infusion in the ED and was given full dose ASA. He has not required any supplemental O2.   At the time of my evaluation, he is resting comfortably in ED stretcher. We discussed his recent symptoms. He states that overall his dyspnea on exertion has been stable, as well as his orthopnea. Has also felt like his legs have been weaker than usual. Denies any recent problems with chest pain, palpitations, dizziness/syncope. He reports that he does wear BiPAP at night, but does not require oxygen supplementation. 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 After being started on a lasix gtt in the ED he has not had much increase in UOP. He states that he has not had much success with lasix in the past, reports that metolazone has worked quite well before to help  with diuresis.   Review of systems complete and found to be negative unless listed above    Past Medical History:  Diagnosis Date   CHF (congestive heart failure) (HCC)    Coronary artery disease    Inferior myocardial infarction in 2004.  He was treated with thrombolytics at [facility]  He then had an inferior MI in March 2007.  He received a Horizon study stent 3.5 x 20 mm in the distal RCA at [facility].  No other obstructive disease.   Diabetes mellitus    TYPE II   Erectile dysfunction    Hyperlipidemia    Hypertension    MI (myocardial infarction) (HCC) 8295,6213   Morbid obesity (HCC)    Sleep apnea     Past Surgical History:  Procedure Laterality Date   CARDIAC CATHETERIZATION     @ [facility]   CHOLECYSTECTOMY     LEFT HEART CATH AND CORONARY ANGIOGRAPHY N/A 07/05/2016   Procedure: Left Heart Cath and Coronary Angiography;  Surgeon: Alwyn Pea, MD;  Location: [facility] INVASIVE CV LAB;  Service: Cardiovascular;  Laterality: N/A;   SEPTOPLASTY      (Not in a hospital admission)  Social History   Socioeconomic History   Marital status: Married    Spouse name: Not on file   Number of children: Not on file   Years of education: Not on file   Highest education level: Not on file  Occupational History   Not on file  Tobacco Use   Smoking status: Every  Day    Current packs/day: 0.00    Average packs/day: 1 pack/day for 15.0 years (15.0 ttl pk-yrs)    Types: Cigars, Cigarettes    Start date: 2005    Last attempt to quit: 2020    Years since quitting: 4.9   Smokeless tobacco: Never  Vaping Use   Vaping status: Never Used  Substance and Sexual Activity   Alcohol use: No   Drug use: No    Comment: prior hx of cocaine and marijuana   Sexual activity: Not on file  Other Topics Concern   Not on file  Social History Narrative   Not on file   Social Determinants of Health   Financial Resource Strain: Not on file  Food Insecurity: Not on file  Transportation Needs:  Not on file  Physical Activity: Not on file  Stress: Not on file  Social Connections: Not on file  Intimate Partner Violence: Not on file    Family History  Adopted: Yes     Vitals:   12/27/22 0515 12/27/22 0900 12/27/22 0930 12/27/22 1000  BP: 98/73 92/74 97/79  96/79  Pulse: 74 79 79 80  Resp: 17 19  18   Temp: 98.2 F (36.8 C)     TempSrc: Axillary     SpO2: 97%   97%  Weight:      Height:        PHYSICAL EXAM General: Well appearing male, well nourished, in no acute distress. HEENT: Normocephalic and atraumatic. Neck: No JVD.  Lungs: Normal respiratory effort on room air. Clear bilaterally to auscultation. No wheezes, crackles, rhonchi.  Heart: HRRR. Normal S1 and S2 without gallops or murmurs.  Abdomen: Distended appearing Msk: Normal strength and tone for age. Extremities: Warm and well perfused. No clubbing, cyanosis. 3+ pitting edema up to knees.  Neuro: Alert and oriented X 3. Psych: Answers questions appropriately.   Labs: Basic Metabolic Panel: Recent Labs    12/26/22 1612  NA 134*  K 3.7  CL 96*  CO2 28  GLUCOSE 193*  BUN 33*  CREATININE 1.18  CALCIUM 7.9*   Liver Function Tests: No results for input(s): "AST", "ALT", "ALKPHOS", "BILITOT", "PROT", "ALBUMIN" in the last 72 hours. No results for input(s): "LIPASE", "AMYLASE" in the last 72 hours. CBC: Recent Labs    12/26/22 1612  WBC 9.8  HGB 15.5  HCT 47.2  MCV 83.8  PLT 428*   Cardiac Enzymes: Recent Labs    12/26/22 1619 12/26/22 2053  TROPONINIHS 144* 202*   BNP: Recent Labs    12/26/22 1619  BNP 827.9*   D-Dimer: No results for input(s): "DDIMER" in the last 72 hours. Hemoglobin A1C: Recent Labs    12/26/22 2232  HGBA1C 7.7*   Fasting Lipid Panel: No results for input(s): "CHOL", "HDL", "LDLCALC", "TRIG", "CHOLHDL", "LDLDIRECT" in the last 72 hours. Thyroid Function Tests: No results for input(s): "TSH", "T4TOTAL", "T3FREE", "THYROIDAB" in the last 72  hours.  Invalid input(s): "FREET3" Anemia Panel: No results for input(s): "VITAMINB12", "FOLATE", "FERRITIN", "TIBC", "IRON", "RETICCTPCT" in the last 72 hours.   Radiology: DG Chest 2 View  Result Date: 12/26/2022 CLINICAL DATA:  Chest pain. EXAM: CHEST - 2 VIEW COMPARISON:  05/30/2022. FINDINGS: Low lung volume. There are diffuse increased interstitial markings when compared to the prior exam. Bilateral lung fields are otherwise clear. No acute consolidation or lung collapse. Bilateral costophrenic angles are clear. Stable cardio-mediastinal silhouette. No acute osseous abnormalities. The soft tissues are within normal limits. IMPRESSION: *In appropriate clinical  setting, findings favor mild congestive heart failure/pulmonary edema. Electronically Signed   By: Jules Schick M.D.   On: 12/26/2022 18:28   MR CARDIAC MORPHOLOGY W WO CONTRAST  Result Date: 12/25/2022 CLINICAL DATA:  Cardiomyopathy, hx of CAD/PCI. Eval for infiltrative process. EXAM: CARDIAC MRI TECHNIQUE: The patient was scanned on a 1.5 Tesla Siemens magnet. A dedicated cardiac coil was used. Functional imaging was done using Fiesta sequences. 2,3, and 4 chamber views were done to assess for RWMA's. Modified Simpson's rule using a short axis stack was used to calculate an ejection fraction on a dedicated work [employer]. The patient received 15 cc of Gadavist. After 10 minutes inversion recovery sequences were used to assess for infiltration and scar tissue. Velocity flow mapping performed in the ascending aorta and main pulmonary artery. CONTRAST:  15 cc  of Gadavist FINDINGS: 1. Mildly dilated left ventricular size, mild LV thickness, moderately reduced LV systolic function (LVEF = 37%). There is global hypokinesis. There is diffuse midwall late gadolinium enhancement in the left ventricular myocardium. LVEDV: 235 ml LVESV: 149 ml SV: 86 ml CO: 5L/min Myocardial mass: 207g LV native T1 value 1104 ms (normal <1000 ms)  LV ECV value 62%.  Normal <30%. 2. Normal right ventricular size, thickness, moderately reduced systolic function (RVEF = 30%). There is global hypokinesis. 3.  Mildly dilated left atrial size. normal right atrial size. 4. Normal size of the aortic root, ascending aorta and pulmonary artery. 5. Mild mitral regurgitation, trivial pulmonary regurgitation. No significant valvular abnormalities. 6.  Normal pericardium.  trace pericardial effusion. IMPRESSION: 1. Mildly dilated LV, moderately reduced LV systolic function. LVEF 37%. 2.  Diffuse midwall LGE/scar 3. Elevated native T1 and ECV values suggesting infiltrative disease. 4.  Moderately reduced RV function. 5.  Trivial pericardial effusion 6. Findings suggest infiltrative cardiomyopathy. Recommend complete workup for amyloidosis Electronically Signed   By: Debbe Odea M.D.   On: 12/25/2022 17:50   MR CARDIAC VELOCITY FLOW MAP  Result Date: 12/25/2022 CLINICAL DATA:  Cardiomyopathy, hx of CAD/PCI. Eval for infiltrative process. EXAM: CARDIAC MRI TECHNIQUE: The patient was scanned on a 1.5 Tesla Siemens magnet. A dedicated cardiac coil was used. Functional imaging was done using Fiesta sequences. 2,3, and 4 chamber views were done to assess for RWMA's. Modified Simpson's rule using a short axis stack was used to calculate an ejection fraction on a dedicated work [employer], [employer]. The patient received 15 cc of Gadavist. After 10 minutes inversion recovery sequences were used to assess for infiltration and scar tissue. Velocity flow mapping performed in the ascending aorta and main pulmonary artery. CONTRAST:  15 cc  of Gadavist FINDINGS: 1. Mildly dilated left ventricular size, mild LV thickness, moderately reduced LV systolic function (LVEF = 37%). There is global hypokinesis. There is diffuse midwall late gadolinium enhancement in the left ventricular myocardium. LVEDV: 235 ml LVESV: 149 ml SV: 86 ml CO: 5L/min Myocardial mass: 207g LV  native T1 value 1104 ms (normal <1000 ms) LV ECV value 62%.  Normal <30%. 2. Normal right ventricular size, thickness, moderately reduced systolic function (RVEF = 30%). There is global hypokinesis. 3.  Mildly dilated left atrial size. normal right atrial size. 4. Normal size of the aortic root, ascending aorta and pulmonary artery. 5. Mild mitral regurgitation, trivial pulmonary regurgitation. No significant valvular abnormalities. 6.  Normal pericardium.  trace pericardial effusion. IMPRESSION: 1. Mildly dilated LV, moderately reduced LV systolic function. LVEF 37%. 2.  Diffuse midwall LGE/scar 3. Elevated  native T1 and ECV values suggesting infiltrative disease. 4.  Moderately reduced RV function. 5.  Trivial pericardial effusion 6. Findings suggest infiltrative cardiomyopathy. Recommend complete workup for amyloidosis Electronically Signed   By: Debbe Odea M.D.   On: 12/25/2022 17:50   MR CARDIAC VELOCITY FLOW MAP  Result Date: 12/25/2022 CLINICAL DATA:  Cardiomyopathy, hx of CAD/PCI. Eval for infiltrative process. EXAM: CARDIAC MRI TECHNIQUE: The patient was scanned on a 1.5 Tesla Siemens magnet. A dedicated cardiac coil was used. Functional imaging was done using Fiesta sequences. 2,3, and 4 chamber views were done to assess for RWMA's. Modified Simpson's rule using a short axis stack was used to calculate an ejection fraction on a dedicated work [employer] officer, [employer]. The patient received 15 cc of Gadavist. After 10 minutes inversion recovery sequences were used to assess for infiltration and scar tissue. Velocity flow mapping performed in the ascending aorta and main pulmonary artery. CONTRAST:  15 cc  of Gadavist FINDINGS: 1. Mildly dilated left ventricular size, mild LV thickness, moderately reduced LV systolic function (LVEF = 37%). There is global hypokinesis. There is diffuse midwall late gadolinium enhancement in the left ventricular myocardium. LVEDV: 235 ml LVESV: 149 ml SV: 86 ml  CO: 5L/min Myocardial mass: 207g LV native T1 value 1104 ms (normal <1000 ms) LV ECV value 62%.  Normal <30%. 2. Normal right ventricular size, thickness, moderately reduced systolic function (RVEF = 30%). There is global hypokinesis. 3.  Mildly dilated left atrial size. normal right atrial size. 4. Normal size of the aortic root, ascending aorta and pulmonary artery. 5. Mild mitral regurgitation, trivial pulmonary regurgitation. No significant valvular abnormalities. 6.  Normal pericardium.  trace pericardial effusion. IMPRESSION: 1. Mildly dilated LV, moderately reduced LV systolic function. LVEF 37%. 2.  Diffuse midwall LGE/scar 3. Elevated native T1 and ECV values suggesting infiltrative disease. 4.  Moderately reduced RV function. 5.  Trivial pericardial effusion 6. Findings suggest infiltrative cardiomyopathy. Recommend complete workup for amyloidosis Electronically Signed   By: Debbe Odea M.D.   On: 12/25/2022 17:50    ECHO pending  TELEMETRY reviewed by me 12/27/2022: sinus rhythm rate 80s, 10 bt NSVT ~7 AM  EKG reviewed by me: sinus rhythm rate 92 bpm, PVCs, nonspecific ST-T changes  Data reviewed by me 12/27/2022: last 24h vitals tele labs imaging I/O ED provider note, admission H&P  Principal Problem:   Acute on chronic systolic CHF (congestive heart failure) (HCC) Active Problems:   CAD with hx of STEMI 2018 S/P RCA stent   Diabetes mellitus (HCC)   OSA (obstructive sleep apnea)   Cardiomyopathy, ischemic and infiltrative (HCC)   Hypotension   Obesity, Class III, BMI 40-49.9 (morbid obesity) (HCC)   Elevated troponin    ASSESSMENT AND PLAN:   Jesse [name] is a 52 y.o. male  with a past medical history of CAD (MI 2004/2007, inf STEMI 2018 s/p overlapping DES to RCA), DM2, hyperlipidemia, HTN, OSA, tobacco use and chronic heart failure who presented to the ED on 12/26/2022 for fluid overload. Cardiology was consulted for further evaluation.   # Acute on chronic HFrEF #  Infiltrative cardiomyopathy # Hypotension # Demand ischemia Patient with hx of HFrEF and recently underwent cMRI which demonstrated EF 37%, diffuse midwall LGE/scar, elevated native T1 and ECV suggesting infiltrative disease. Worsening edema for a few weeks, no significant change in baseline SOB/orthopnea. BNP elevated at 827. Troponins 144 > 202. CXR with mild vascular congestion. Borderline hypotensive since admission, no dizziness/lightheadedness.  -Bolus  160 mg lasix and increase gtt rate to 20 mg/hr. Also give a dose of metolazone 5 mg today. Consider additional dose of metolazone tomorrow as renal function allows if suboptimal response to diuresis today. -Hold home metoprolol. Consider further additions to GDMT as BP and renal function allow.  -Kappa/lambda light chains, Urine immunofixation, and multiple myeloma labs pending for additional evaluation of amyloidosis. -Echo pending. -Troponin elevation most consistent with demand/supply mismatch and not ACS in the setting of acute heart failure. -Advanced heart failure team following, appreciate their input.  # Coronary artery disease # Hyperlipidemia Patient with hx of CAD, STEMI in 2018 with overlapping DES to RCA. Denies any current or recent CP, anginal symptoms.  -Continue aspirin 81 mg daily and plavix 75 mg daily.  -Patient has not been taking statin outpatient.  # Obstructive sleep apnea Patient with hx of OSA, wears BiPAP nightly at home.  -Continue home OSA treatment.   This patient's plan of care was discussed and created with Dr. Melton Alar and she is in agreement.  Signed: Gale Journey, PA-C  12/27/2022, 10:52 AM [facility]

## 2022-12-28 DIAGNOSIS — I5023 Acute on chronic systolic (congestive) heart failure: Secondary | ICD-10-CM | POA: Diagnosis not present

## 2022-12-28 LAB — CBC
HCT: 42.9 % (ref 39.0–52.0)
Hemoglobin: 14.3 g/dL (ref 13.0–17.0)
MCH: 27.5 pg (ref 26.0–34.0)
MCHC: 33.3 g/dL (ref 30.0–36.0)
MCV: 82.5 fL (ref 80.0–100.0)
Platelets: 441 10*3/uL — ABNORMAL HIGH (ref 150–400)
RBC: 5.2 MIL/uL (ref 4.22–5.81)
RDW: 16.2 % — ABNORMAL HIGH (ref 11.5–15.5)
WBC: 9.7 10*3/uL (ref 4.0–10.5)
nRBC: 0 % (ref 0.0–0.2)

## 2022-12-28 LAB — BASIC METABOLIC PANEL
Anion gap: 11 (ref 5–15)
BUN: 37 mg/dL — ABNORMAL HIGH (ref 6–20)
CO2: 29 mmol/L (ref 22–32)
Calcium: 8 mg/dL — ABNORMAL LOW (ref 8.9–10.3)
Chloride: 94 mmol/L — ABNORMAL LOW (ref 98–111)
Creatinine, Ser: 1.33 mg/dL — ABNORMAL HIGH (ref 0.61–1.24)
GFR, Estimated: >60 mL/min (ref >60)
Glucose, Bld: 153 mg/dL — ABNORMAL HIGH (ref 70–99)
Potassium: 3.2 mmol/L — ABNORMAL LOW (ref 3.5–5.1)
Sodium: 134 mmol/L — ABNORMAL LOW (ref 135–145)

## 2022-12-28 LAB — MAGNESIUM: Magnesium: 2 mg/dL (ref 1.7–2.4)

## 2022-12-28 MED ORDER — POTASSIUM CHLORIDE CRYS ER 20 MEQ PO TBCR
40.0000 meq | EXTENDED_RELEASE_TABLET | Freq: Once | ORAL | Status: AC
  Administered 2022-12-28: 40 meq via ORAL
  Filled 2022-12-28: qty 2

## 2022-12-28 NOTE — Plan of Care (Signed)

## 2022-12-28 NOTE — Progress Notes (Signed)
  PROGRESS NOTE    Jesse [name]  ZOX:[id number] DOB: 1970/10/11 DOA: 12/26/2022 PCP: Leanna Sato, MD  [id number]  LOS: 2 days   Brief hospital course:   Assessment & Plan: Jesse [name] is a 52 y.o. 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  He was fluid overloaded and also hypotensive to 84/62.  With sats of 100%.    * Acute on chronic systolic CHF (congestive heart failure) (HCC) Cardiomyopathy, ischemic and infiltrative (cMRI 12/25/22 suggesting amyloidosis) --echocardiogram-last EF 35 to 40% 03/2022 --started on lasix gtt, and then bolus IV lasix 160 mg and metolazone on 12/6 to kick start. --follows with [facility].  Heart failure team assisting. Plan: --cont lasix gtt --hold Toprol due to hypotension  Hypotension Systolic in the 80s at heart failure clinic, 103 on arrival to the ED Received midodrine 10 mg in the ED --cont midodrine 10 mg TID --hold Toprol due to hypotension  CAD with hx of STEMI 2018 S/P RCA stent --cont asa and plavix  Elevated troponin, suspect demand ischemia Patient denies chest pain and EKG nonacute Trop 144 and 202  Obesity, Class III, BMI 40-49.9 (morbid obesity) (HCC) Complicating factor to overall prognosis and care  OSA (obstructive sleep apnea) Currently awaiting delivery of home CPAP CPAP nightly as needed  Diabetes mellitus (HCC) --A1c 7.7 --BG has been within inpatient goals --d/c'ed BG checks and SSI  Hypokalemia --due to diuesis --monitor and supplement PRN   DVT prophylaxis: Lovenox SQ Code Status: Full code  Family Communication:  Level of care: Progressive Dispo:   The patient  is from: home Anticipated d/c is to: home Anticipated d/c date is: 2-3 days   Subjective and Interval History:  Urine output has picked up.     Objective: Vitals:   12/27/22 1700 12/28/22 0858 12/28/22 1214 12/28/22 1716  BP: 93/76 (!) 84/68 103/82 104/81  Pulse: 81 81 79 77  Resp:  20 18 12   Temp:  (!) 97.5 F (36.4 C) (!) 97.3 F (36.3 C) (!) 97.5 F (36.4 C)  TempSrc:      SpO2:  98% 98% 100%  Weight:      Height:        Intake/Output Summary (Last 24 hours) at 12/28/2022 1828 Last data filed at 12/28/2022 1700 Gross per 24 hour  Intake 796.36 ml  Output 4900 ml  Net -4103.64 ml   Filed Weights   12/26/22 1610  Weight: 127.5 kg    Examination:   Constitutional: NAD, AAOx3 HEENT: conjunctivae and lids normal, EOMI CV: No cyanosis.   RESP: normal respiratory effort, on RA Extremities: compression stocking in BLE SKIN: warm, dry Neuro: II - XII grossly intact.   Psych: Normal mood and affect.  Appropriate judgement and reason   Data Reviewed: I have personally reviewed labs and imaging studies  Time spent: 35 minutes  Darlin Priestly, MD [facility] If 7PM-7AM, please contact night-coverage 12/28/2022, 6:28 PM

## 2022-12-28 NOTE — Progress Notes (Signed)
[facility] CLINIC CARDIOLOGY PROGRESS NOTE   Patient ID: NOVAK RIEHM MRN: [id number] DOB/AGE: Sep 11, 1970 52 y.o.  Admit date: 12/26/2022 Referring Physician  Dr. Lindajo Royal Primary Physician Leanna Sato, MD  Primary Cardiologist Dr. Dorothyann Peng Reason for Consultation AoCHF, amyloid workup   HPI: KEENON AUSTERMAN is a 52 y.o. male  with a past medical history of CAD (MI 2004/2007, inf STEMI 2018 s/p overlapping DES to RCA), DM2, hyperlipidemia, HTN, OSA, tobacco use and chronic heart failure who presented to the ED on 12/26/2022 for fluid overload. Cardiology was consulted for further evaluation.    Patient reports that over the last few weeks he has had worsening swelling all over his body. Denies any significant worsening in his baseline DOE. Main concern has been leg and abdominal edema. He was seen by Clarisa Kindred at [facility] yesterday and was advised to come to the ED for further evaluation. Workup in the ED notable for creatinine 1.18, K 3.7, hgb 15.5, WBC 9.8. BNP 827, troponins 144 > 202. He was started on a lasix infusion in the ED and was given full dose ASA. He has not required any supplemental O2.   Interval History:  -Patient seen and examined at bedside, resting comfortably. No events overnight. Edema and breathing have significantly improved.  Review of systems complete and found to be negative unless listed above    Vitals:   12/27/22 1500 12/27/22 1630 12/27/22 1700 12/28/22 0858  BP: 102/77 113/74 93/76 (!) 84/68  Pulse: 80 87 81 81  Resp:    20  Temp:    (!) 97.5 F (36.4 C)  TempSrc:      SpO2:    98%  Weight:      Height:         Intake/Output Summary (Last 24 hours) at 12/28/2022 1120 Last data filed at 12/28/2022 0935 Gross per 24 hour  Intake 743.08 ml  Output 4700 ml  Net -3956.92 ml     PHYSICAL EXAM General: awake, well nourished, in no acute distress. HEENT: Normocephalic and atraumatic. Neck: No JVD.  Lungs: Normal  respiratory effort. Clear bilaterally to auscultation. No wheezes, crackles, rhonchi.  Heart: HRRR. Normal S1 and S2 without gallops or murmurs. Radial & DP pulses 2+ bilaterally. Abdomen: Non-distended appearing.  Msk: Normal strength and tone for age. Extremities: No clubbing, cyanosis. 2-3+edema.   Neuro: Alert and oriented X 3. Psych: Mood appropriate, affect congruent.    LABS: Basic Metabolic Panel: Recent Labs    12/26/22 1612 12/28/22 0626  NA 134* 134*  K 3.7 3.2*  CL 96* 94*  CO2 28 29  GLUCOSE 193* 153*  BUN 33* 37*  CREATININE 1.18 1.33*  CALCIUM 7.9* 8.0*  MG  --  2.0   Liver Function Tests: No results for input(s): "AST", "ALT", "ALKPHOS", "BILITOT", "PROT", "ALBUMIN" in the last 72 hours. No results for input(s): "LIPASE", "AMYLASE" in the last 72 hours. CBC: Recent Labs    12/26/22 1612 12/28/22 0626  WBC 9.8 9.7  HGB 15.5 14.3  HCT 47.2 42.9  MCV 83.8 82.5  PLT 428* 441*   Cardiac Enzymes: Recent Labs    12/26/22 1619 12/26/22 2053  TROPONINIHS 144* 202*   BNP: Recent Labs    12/26/22 1619  BNP 827.9*   D-Dimer: No results for input(s): "DDIMER" in the last 72 hours. Hemoglobin A1C: Recent Labs    12/26/22 2232  HGBA1C 7.7*   Fasting Lipid Panel: No results for input(s): "CHOL", "  HDL", "LDLCALC", "TRIG", "CHOLHDL", "LDLDIRECT" in the last 72 hours. Thyroid Function Tests: No results for input(s): "TSH", "T4TOTAL", "T3FREE", "THYROIDAB" in the last 72 hours.  Invalid input(s): "FREET3" Anemia Panel: No results for input(s): "VITAMINB12", "FOLATE", "FERRITIN", "TIBC", "IRON", "RETICCTPCT" in the last 72 hours.  MR FEMUR LEFT W WO CONTRAST  Result Date: 12/28/2022 CLINICAL DATA:  Bone lesion, femur, incidental. Palpable lump in the mid to distal left thigh for 2-3 months. History of multiple myeloma. EXAM: MR OF THE LEFT LOWER EXTREMITY WITHOUT AND WITH CONTRAST TECHNIQUE: Multiplanar, multisequence MR imaging of the left thigh was  performed both before and after administration of intravenous contrast. CONTRAST:  10mL GADAVIST GADOBUTROL 1 MMOL/ML IV SOLN COMPARISON:  Lower extremity venous Doppler ultrasound 04/14/2022. Bone survey 12/27/2022. FINDINGS: Bones/Joint/Cartilage Both thighs are included on the coronal images. There is a broad-based exostosis involving the anterior aspect of the distal left femur, corresponding with the radiographic abnormality. This exostosis measures approximately 2.6 x 1.8 x 6.3 cm and is consistent with an incidental osteochondroma. No associated marrow enhancement, cartilaginous cap thickening or pseudo bursal formation. The left femur otherwise appears unremarkable. There are no suspicious osseous lesions. No evidence of acute fracture, dislocation or femoral head osteonecrosis. No significant arthropathy or effusions identified at either knee or hip. Ligaments No gross ligamentous abnormalities on large field-of-view imaging. Muscles and Tendons Mild left common hamstring tendinosis without tear. No focal muscular abnormalities are identified within the left thigh. Mild asymmetric T2 hyperintensity demonstrated within the right thigh musculature, greatest within the vastus lateralis. Soft tissues At least moderate diffuse subcutaneous edema is noted within both thighs. This appears fairly symmetric on the coronal images. No focal fluid collection or suspicious soft tissue enhancement identified. The left testis appears retracted into the left inguinal canal. This appears changed from pelvic CT 03/06/2019 and therefore not consistent with an incompletely descended testicle. Correlate clinically. IMPRESSION: 1. The palpable abnormality in the distal left thigh corresponds with an incidental osteochondroma. No suspicious osseous lesions identified. 2. At least moderate diffuse subcutaneous edema within both thighs, nonspecific. No focal fluid collection or suspicious soft tissue enhancement identified. 3. Mild  asymmetric T2 hyperintensity within the right thigh musculature, greatest within the vastus lateralis. This is nonspecific and could be secondary to subacute denervation, venous stasis or myositis. 4. The left testis appears retracted into the left inguinal canal. Correlate clinically. Electronically Signed   By: Carey Bullocks M.D.   On: 12/28/2022 08:43   DG Bone Survey Met  Result Date: 12/27/2022 CLINICAL DATA:  Multiple myeloma, posterior neck pain around C7, lump on left distal femur x2 months EXAM: METASTATIC BONE SURVEY COMPARISON:  None Available. FINDINGS: No lytic lesion in the calvarium. Bilateral upper extremities are within normal limits. Mild degenerative changes of the lower cervical spine, without lytic lesion. Thoracic spine is within normal limits. Mild degenerative changes of the upper lumbar spine, without lytic lesion. Lungs are clear.  The heart is normal in size. No lytic lesion in the bony pelvis. Right lower extremity is within normal limits. In the left lower extremity, there is an 8.5 cm mixed/sclerotic lesion in the lateral distal femoral shaft with overlying cortical thickening/periosteal reaction, with a benign appearance. This appearance favors a nonaggressive lesions such as nonossifying fibroma or less likely enchondroma. Multiple myeloma is considered unlikely. IMPRESSION: 8.5 cm mixed/sclerotic lesion in the lateral distal left femoral shaft, favoring a nonaggressive lesion such as nonossifying fibroma or less likely enchondroma. Multiple myeloma is considered unlikely. Otherwise  negative multiple myeloma. Follow-up orthopedic consultation is suggested with dedicated femur radiographs prior to possible MR. Electronically Signed   By: Charline Bills M.D.   On: 12/27/2022 17:20   ECHOCARDIOGRAM COMPLETE  Result Date: 12/27/2022    ECHOCARDIOGRAM REPORT   Patient Name:   DARYEL ARROYOS Date of Exam: 12/27/2022 Medical Rec #:  [id number]        Height:       71.0 in  Accession #:    [id number]       Weight:       281.0 lb Date of Birth:  02-18-1970        BSA:          2.436 m Patient Age:    52 years         BP:           98/73 mmHg Patient Gender: M                HR:           81 bpm. Exam Location:  [facility] Procedure: 2D Echo, 3D Echo, Cardiac Doppler, Color Doppler and Strain Analysis Indications:     CHF  History:         Patient has prior history of Echocardiogram examinations. CHF                  and Cardiomyopathy, CAD and Previous Myocardial Infarction,                  Signs/Symptoms:Dyspnea; Risk Factors:Hypertension, Diabetes,                  Dyslipidemia and Sleep Apnea.  Sonographer:     Mikki Harbor Referring Phys:  [id number] Andris Baumann Diagnosing Phys: Clotilde Dieter  Sonographer Comments: Patient is obese. Global longitudinal strain was attempted. IMPRESSIONS  1. Left ventricular ejection fraction, by estimation, is 35 to 40%. The left ventricle has moderately decreased function. The left ventricle demonstrates global hypokinesis. Left ventricular diastolic parameters are consistent with Grade I diastolic dysfunction (impaired relaxation).  2. Right ventricular systolic function is low normal. The right ventricular size is moderately enlarged. There is normal pulmonary artery systolic pressure. The estimated right ventricular systolic pressure is 29.2 mmHg.  3. Left atrial size was moderately dilated.  4. Right atrial size was mildly dilated.  5. The mitral valve is degenerative. Moderate mitral valve regurgitation.  6. Tricuspid valve regurgitation is moderate.  7. The aortic valve is normal in structure. Aortic valve regurgitation is not visualized. No aortic stenosis is present. FINDINGS  Left Ventricle: Left ventricular ejection fraction, by estimation, is 35 to 40%. The left ventricle has moderately decreased function. The left ventricle demonstrates global hypokinesis. The left ventricular internal cavity size was normal in size. There is no left  ventricular hypertrophy. Left ventricular diastolic parameters are consistent with Grade I diastolic dysfunction (impaired relaxation). Right Ventricle: The right ventricular size is moderately enlarged. No increase in right ventricular wall thickness. Right ventricular systolic function is low normal. There is normal pulmonary artery systolic pressure. The tricuspid regurgitant velocity  is 2.30 m/s, and with an assumed right atrial pressure of 8 mmHg, the estimated right ventricular systolic pressure is 29.2 mmHg. Left Atrium: Left atrial size was moderately dilated. Right Atrium: Right atrial size was mildly dilated. Pericardium: There is no evidence of pericardial effusion. Mitral Valve: The mitral valve is degenerative in appearance. Moderate mitral valve regurgitation. MV peak gradient, 8.0 mmHg. The  mean mitral valve gradient is 3.0 mmHg. Tricuspid Valve: The tricuspid valve is grossly normal. Tricuspid valve regurgitation is moderate. Aortic Valve: The aortic valve is normal in structure. Aortic valve regurgitation is not visualized. No aortic stenosis is present. Aortic valve mean gradient measures 2.0 mmHg. Aortic valve peak gradient measures 3.1 mmHg. Aortic valve area, by VTI measures 2.15 cm. Pulmonic Valve: The pulmonic valve was grossly normal. Pulmonic valve regurgitation is not visualized. Aorta: The aortic root is normal in size and structure. IAS/Shunts: No atrial level shunt detected by color flow Doppler.  LEFT VENTRICLE PLAX 2D LVIDd:         5.30 cm LVIDs:         4.10 cm LV PW:         1.40 cm LV IVS:        1.40 cm LVOT diam:     2.20 cm LV SV:         36 LV SV Index:   15 LVOT Area:     3.80 cm  LV Volumes (MOD) LV vol d, MOD A2C: 98.6 ml LV vol d, MOD A4C: 117.0 ml LV vol s, MOD A2C: 71.0 ml LV vol s, MOD A4C: 73.1 ml LV SV MOD A2C:     27.6 ml LV SV MOD A4C:     117.0 ml LV SV MOD BP:      34.9 ml RIGHT VENTRICLE RV Basal diam:  4.05 cm RV Mid diam:    3.80 cm RV S prime:     9.34 cm/s  LEFT ATRIUM             Index        RIGHT ATRIUM           Index LA diam:        5.20 cm 2.13 cm/m   RA Area:     22.10 cm LA Vol (A2C):   83.8 ml 34.40 ml/m  RA Volume:   67.50 ml  27.70 ml/m LA Vol (A4C):   93.6 ml 38.42 ml/m LA Biplane Vol: 95.8 ml 39.32 ml/m  AORTIC VALVE                    PULMONIC VALVE AV Area (Vmax):    2.90 cm     PV Vmax:       0.66 m/s AV Area (Vmean):   2.23 cm     PV Peak grad:  1.7 mmHg AV Area (VTI):     2.15 cm AV Vmax:           87.70 cm/s AV Vmean:          63.300 cm/s AV VTI:            0.168 m AV Peak Grad:      3.1 mmHg AV Mean Grad:      2.0 mmHg LVOT Vmax:         67.00 cm/s LVOT Vmean:        37.100 cm/s LVOT VTI:          0.095 m LVOT/AV VTI ratio: 0.56  AORTA Ao Root diam: 3.80 cm MITRAL VALVE                TRICUSPID VALVE MV Area (PHT): 3.99 cm     TR Peak grad:   21.2 mmHg MV Area VTI:   1.17 cm     TR Vmax:        230.00 cm/s MV  Peak grad:  8.0 mmHg MV Mean grad:  3.0 mmHg     SHUNTS MV Vmax:       1.41 m/s     Systemic VTI:  0.09 m MV Vmean:      73.5 cm/s    Systemic Diam: 2.20 cm MV Decel Time: 190 msec MV E velocity: 136.00 cm/s Rozell Searing Annelyse Rey Electronically signed by Clotilde Dieter Signature Date/Time: 12/27/2022/1:22:46 PM    Final    DG Chest 2 View  Result Date: 12/26/2022 CLINICAL DATA:  Chest pain. EXAM: CHEST - 2 VIEW COMPARISON:  05/30/2022. FINDINGS: Low lung volume. There are diffuse increased interstitial markings when compared to the prior exam. Bilateral lung fields are otherwise clear. No acute consolidation or lung collapse. Bilateral costophrenic angles are clear. Stable cardio-mediastinal silhouette. No acute osseous abnormalities. The soft tissues are within normal limits. IMPRESSION: *In appropriate clinical setting, findings favor mild congestive heart failure/pulmonary edema. Electronically Signed   By: Jules Schick M.D.   On: 12/26/2022 18:28     ECHO as above  TELEMETRY reviewed by me 12/28/22: sinus rhythm rate 80s, 10  bt NSVT on 12/6  EKG reviewed by me: sinus rhythm rate 92 bpm, PVCs, nonspecific ST-T changes   DATA reviewed by me 12/28/22: last 24h vitals tele labs imaging I/O ED provider note, admission H&P    ASSESSMENT AND PLAN:  Principal Problem:   Acute on chronic systolic CHF (congestive heart failure) (HCC) Active Problems:   CAD with hx of STEMI 2018 S/P RCA stent   Diabetes mellitus (HCC)   OSA (obstructive sleep apnea)   Cardiomyopathy, ischemic and infiltrative (HCC)   Hypotension   Obesity, Class III, BMI 40-49.9 (morbid obesity) (HCC)   Elevated troponin    KIEREN GORDIN is a 52 y.o. male  with a past medical history of CAD (MI 2004/2007, inf STEMI 2018 s/p overlapping DES to RCA), DM2, hyperlipidemia, HTN, OSA, tobacco use and chronic heart failure who presented to the ED on 12/26/2022 for fluid overload. Cardiology was consulted for further evaluation.    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 Worsening edema for a few weeks, no significant change in baseline SOB/orthopnea. BNP elevated at 827. Troponins 144 > 202. CXR with mild vascular congestion. Borderline hypotensive since admission, no dizziness/lightheadedness.  -Bolus 160 mg lasix and increase gtt rate to 20 mg/hr. Also give a dose of metolazone 5 mg today. Consider additional dose of metolazone tomorrow as renal function allows if suboptimal response to diuresis today. -Hold home metoprolol. Consider further additions to GDMT as BP and renal function allow.  -Kappa/lambda light chains, Urine immunofixation, and multiple myeloma labs pending for additional evaluation of amyloidosis. -Troponin elevation most consistent with demand/supply mismatch and not ACS in the setting of acute heart failure. -Advanced heart  failure team following, appreciate their input.   # Coronary artery disease # Hyperlipidemia Patient with hx of CAD, STEMI in 2018 with overlapping DES to RCA. Denies any current or recent CP, anginal symptoms.  -Continue aspirin 81 mg daily and plavix 75 mg daily.  -Patient has not been taking statin outpatient.   # Obstructive sleep apnea Patient with hx of OSA, wears BiPAP nightly at home.  -Continue home OSA treatment.  Clotilde Dieter, DO  12/28/2022, 11:20 AM [facility]

## 2022-12-29 DIAGNOSIS — I5023 Acute on chronic systolic (congestive) heart failure: Secondary | ICD-10-CM | POA: Diagnosis not present

## 2022-12-29 LAB — CBC
HCT: 46.7 % (ref 39.0–52.0)
Hemoglobin: 15.6 g/dL (ref 13.0–17.0)
MCH: 26.9 pg (ref 26.0–34.0)
MCHC: 33.4 g/dL (ref 30.0–36.0)
MCV: 80.7 fL (ref 80.0–100.0)
Platelets: 468 10*3/uL — ABNORMAL HIGH (ref 150–400)
RBC: 5.79 MIL/uL (ref 4.22–5.81)
RDW: 16.1 % — ABNORMAL HIGH (ref 11.5–15.5)
WBC: 9.6 10*3/uL (ref 4.0–10.5)
nRBC: 0 % (ref 0.0–0.2)

## 2022-12-29 LAB — BASIC METABOLIC PANEL
Anion gap: 13 (ref 5–15)
BUN: 37 mg/dL — ABNORMAL HIGH (ref 6–20)
CO2: 28 mmol/L (ref 22–32)
Calcium: 8.2 mg/dL — ABNORMAL LOW (ref 8.9–10.3)
Chloride: 91 mmol/L — ABNORMAL LOW (ref 98–111)
Creatinine, Ser: 1.21 mg/dL (ref 0.61–1.24)
GFR, Estimated: >60 mL/min (ref >60)
Glucose, Bld: 140 mg/dL — ABNORMAL HIGH (ref 70–99)
Potassium: 3.1 mmol/L — ABNORMAL LOW (ref 3.5–5.1)
Sodium: 132 mmol/L — ABNORMAL LOW (ref 135–145)

## 2022-12-29 LAB — MAGNESIUM: Magnesium: 1.9 mg/dL (ref 1.7–2.4)

## 2022-12-29 MED ORDER — POTASSIUM CHLORIDE CRYS ER 20 MEQ PO TBCR
40.0000 meq | EXTENDED_RELEASE_TABLET | ORAL | Status: AC
  Administered 2022-12-29 (×2): 40 meq via ORAL
  Filled 2022-12-29 (×2): qty 2

## 2022-12-29 NOTE — Plan of Care (Signed)
Progressing towards goals

## 2022-12-29 NOTE — Progress Notes (Signed)
  PROGRESS NOTE    Jesse [name]  RUE:[id number] DOB: August 05, 1970 DOA: 12/26/2022 PCP: Leanna Sato, MD  [id number]  LOS: 3 days   Brief hospital course:   Assessment & Plan: Jesse [name] is a 52 y.o. 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  He was fluid overloaded and also hypotensive to 84/62.  With sats of 100%.    * Acute on chronic systolic CHF (congestive heart failure) (HCC) Cardiomyopathy, ischemic and infiltrative (cMRI 12/25/22 suggesting amyloidosis) --echocardiogram-last EF 35 to 40% 03/2022 --started on lasix gtt, and then bolus IV lasix 160 mg and metolazone on 12/6 to kick start. --follows with [facility].  Heart failure team assisting. Plan: --cont lasix gtt --hold Toprol due to hypotension  Hypotension Systolic in the 80s at heart failure clinic, 103 on arrival to the ED Received midodrine 10 mg in the ED --cont midodrine 10 mg TID --hold Toprol due to hypotension  CAD with hx of STEMI 2018 S/P RCA stent --cont asa and plavix  Elevated troponin, suspect demand ischemia Patient denies chest pain and EKG nonacute Trop 144 and 202  Obesity, Class III, BMI 40-49.9 (morbid obesity) (HCC) Complicating factor to overall prognosis and care  OSA (obstructive sleep apnea) Currently awaiting delivery of home CPAP CPAP nightly as needed  Diabetes mellitus (HCC) --A1c 7.7 --BG has been within inpatient goals --d/c'ed BG checks and SSI  Hypokalemia --due to diuesis --monitor and supplement PRN   DVT prophylaxis: Lovenox SQ Code Status: Full code  Family Communication: wife updated at bedside today Level of care:  Progressive Dispo:   The patient is from: home Anticipated d/c is to: home Anticipated d/c date is: 2-3 days   Subjective and Interval History:  Continued to have good urine output.  Swelling improved.   Objective: Vitals:   12/29/22 0011 12/29/22 0420 12/29/22 0500 12/29/22 1200  BP: 99/70 91/71    Pulse: 80 79    Resp: 17 18    Temp: 97.7 F (36.5 C) (!) 97.5 F (36.4 C)    TempSrc: Oral Oral    SpO2: 100% 95%    Weight:   125.3 kg 119.7 kg  Height:        Intake/Output Summary (Last 24 hours) at 12/29/2022 1803 Last data filed at 12/29/2022 0900 Gross per 24 hour  Intake --  Output 2550 ml  Net -2550 ml   Filed Weights   12/26/22 1610 12/29/22 0500 12/29/22 1200  Weight: 127.5 kg 125.3 kg 119.7 kg    Examination:   Constitutional: NAD, AAOx3 HEENT: conjunctivae and lids normal, EOMI CV: No cyanosis.   RESP: normal respiratory effort, on RA Extremities: compression stockings over BLE SKIN: warm, dry Neuro: II - XII grossly intact.   Psych: Normal mood and affect.  Appropriate judgement and reason   Data Reviewed: I have personally reviewed labs and imaging studies  Time spent: 35 minutes  Darlin Priestly, MD [facility] If 7PM-7AM, please contact night-coverage 12/29/2022, 6:03 PM

## 2022-12-30 ENCOUNTER — Encounter (INDEPENDENT_AMBULATORY_CARE_PROVIDER_SITE_OTHER): Payer: Medicare Other | Admitting: Vascular Surgery

## 2022-12-30 ENCOUNTER — Other Ambulatory Visit (HOSPITAL_COMMUNITY): Payer: Self-pay

## 2022-12-30 ENCOUNTER — Telehealth (HOSPITAL_COMMUNITY): Payer: Self-pay | Admitting: Pharmacy Technician

## 2022-12-30 DIAGNOSIS — I5023 Acute on chronic systolic (congestive) heart failure: Secondary | ICD-10-CM | POA: Diagnosis not present

## 2022-12-30 DIAGNOSIS — R29898 Other symptoms and signs involving the musculoskeletal system: Secondary | ICD-10-CM | POA: Insufficient documentation

## 2022-12-30 LAB — CBC
HCT: 44.5 % (ref 39.0–52.0)
Hemoglobin: 15 g/dL (ref 13.0–17.0)
MCH: 27.4 pg (ref 26.0–34.0)
MCHC: 33.7 g/dL (ref 30.0–36.0)
MCV: 81.2 fL (ref 80.0–100.0)
Platelets: 441 10*3/uL — ABNORMAL HIGH (ref 150–400)
RBC: 5.48 MIL/uL (ref 4.22–5.81)
RDW: 16.2 % — ABNORMAL HIGH (ref 11.5–15.5)
WBC: 9.2 10*3/uL (ref 4.0–10.5)
nRBC: 0 % (ref 0.0–0.2)

## 2022-12-30 LAB — BASIC METABOLIC PANEL
Anion gap: 13 (ref 5–15)
BUN: 36 mg/dL — ABNORMAL HIGH (ref 6–20)
CO2: 31 mmol/L (ref 22–32)
Calcium: 8.2 mg/dL — ABNORMAL LOW (ref 8.9–10.3)
Chloride: 90 mmol/L — ABNORMAL LOW (ref 98–111)
Creatinine, Ser: 1.32 mg/dL — ABNORMAL HIGH (ref 0.61–1.24)
GFR, Estimated: >60 mL/min (ref >60)
Glucose, Bld: 143 mg/dL — ABNORMAL HIGH (ref 70–99)
Potassium: 3.5 mmol/L (ref 3.5–5.1)
Sodium: 134 mmol/L — ABNORMAL LOW (ref 135–145)

## 2022-12-30 LAB — MAGNESIUM: Magnesium: 1.9 mg/dL (ref 1.7–2.4)

## 2022-12-30 MED ORDER — POTASSIUM CHLORIDE CRYS ER 20 MEQ PO TBCR
40.0000 meq | EXTENDED_RELEASE_TABLET | Freq: Once | ORAL | Status: AC
  Administered 2022-12-30: 40 meq via ORAL
  Filled 2022-12-30: qty 2

## 2022-12-30 MED ORDER — DAPAGLIFLOZIN PROPANEDIOL 10 MG PO TABS
10.0000 mg | ORAL_TABLET | Freq: Every day | ORAL | Status: DC
  Filled 2022-12-30: qty 1

## 2022-12-30 MED ORDER — EMPAGLIFLOZIN 10 MG PO TABS
10.0000 mg | ORAL_TABLET | Freq: Every day | ORAL | Status: DC
  Administered 2022-12-30 – 2022-12-31 (×2): 10 mg via ORAL
  Filled 2022-12-30 (×2): qty 1

## 2022-12-30 MED ORDER — TORSEMIDE 20 MG PO TABS
40.0000 mg | ORAL_TABLET | Freq: Two times a day (BID) | ORAL | Status: DC
  Administered 2022-12-30 – 2022-12-31 (×3): 40 mg via ORAL
  Filled 2022-12-30 (×3): qty 2

## 2022-12-30 NOTE — Progress Notes (Signed)
  PROGRESS NOTE    Jesse [name]  BJY:[id number] DOB: March 31, 1970 DOA: 12/26/2022 PCP: Leanna Sato, MD  [id number]  LOS: 4 days   Brief hospital course:   Assessment & Plan: Jesse [name] is a 52 y.o. 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  He was fluid overloaded and also hypotensive to 84/62.  With sats of 100%.    * Acute on chronic systolic CHF (congestive heart failure) (HCC) Cardiomyopathy, ischemic and infiltrative (cMRI 12/25/22 suggesting amyloidosis) --echocardiogram-last EF 35 to 40% 03/2022 --started on lasix gtt, and then bolus IV lasix 160 mg and metolazone on 12/6 to kick start. --follows with [facility].  Heart failure team assisting. Plan: --transition from lasix gtt to torsemide 20 BID today --hold Toprol due to hypotension  Hypotension Systolic in the 80s at heart failure clinic, 103 on arrival to the ED Received midodrine 10 mg in the ED --cont midodrine 10 mg TID --hold Toprol due to hypotension  CAD with hx of STEMI 2018 S/P RCA stent --cont asa and plavix  Elevated troponin, suspect demand ischemia Patient denies chest pain and EKG nonacute Trop 144 and 202  Obesity, Class III, BMI 40-49.9 (morbid obesity) (HCC) Complicating factor to overall prognosis and care  OSA (obstructive sleep apnea) Currently awaiting delivery of home CPAP CPAP nightly as needed  Diabetes mellitus (HCC) --A1c 7.7 --BG has been within inpatient goals --d/c'ed BG checks and SSI  Hypokalemia --due to diuesis --monitor and supplement PRN   DVT prophylaxis: Lovenox SQ Code Status: Full code  Family Communication:  Level of care:  Progressive Dispo:   The patient is from: home Anticipated d/c is to: home Anticipated d/c date is: tomorrow   Subjective and Interval History:  Pt had no complaint today.  Transitioned from Lasix gtt to oral diuretic today.   Objective: Vitals:   12/30/22 0721 12/30/22 0748 12/30/22 1133 12/30/22 1416  BP: 102/75 101/79 130/83 97/74  Pulse: 79 81 83 78  Resp: 16 18 18 16   Temp: 97.8 F (36.6 C) 97.9 F (36.6 C) (!) 97.3 F (36.3 C) 98.4 F (36.9 C)  TempSrc: Oral Oral  Oral  SpO2: 100% 100% 100% 99%  Weight:      Height:        Intake/Output Summary (Last 24 hours) at 12/30/2022 1905 Last data filed at 12/30/2022 1800 Gross per 24 hour  Intake 1000 ml  Output 4350 ml  Net -3350 ml   Filed Weights   12/30/22 0500 12/30/22 0600 12/30/22 0627  Weight: 119 kg 118.7 kg 117.1 kg    Examination:   Constitutional: NAD, AAOx3 HEENT: conjunctivae and lids normal, EOMI CV: No cyanosis.   RESP: normal respiratory effort, on RA Extremities: compression stockings in BLE Neuro: II - XII grossly intact.   Psych: Normal mood and affect.  Appropriate judgement and reason   Data Reviewed: I have personally reviewed labs and imaging studies  Time spent: 35 minutes  Darlin Priestly, MD [facility] If 7PM-7AM, please contact night-coverage 12/30/2022, 7:05 PM

## 2022-12-30 NOTE — Progress Notes (Addendum)
Advanced Heart Failure Rounding Note   Subjective:    Has diuresed over the weekend with lasix gtt. Diuresed about 9L. Overall weight down 23lbs.   SCr 1.33>1.21>1.32  Feels good, ready to go home. Denies CP/SOB. Has been walking around.   Objective:   Weight Range:  Vital Signs:   Temp:  [97.5 F (36.4 C)-98.1 F (36.7 C)] 97.9 F (36.6 C) (12/09 0748) Pulse Rate:  [76-81] 81 (12/09 0748) Resp:  [16-20] 18 (12/09 0748) BP: (99-105)/(75-79) 101/79 (12/09 0748) SpO2:  [99 %-100 %] 100 % (12/09 0748) Weight:  [117.1 kg-119.7 kg] 117.1 kg (12/09 0627) Last BM Date : 12/27/22  Weight change: Filed Weights   12/30/22 0500 12/30/22 0600 12/30/22 0627  Weight: 119 kg 118.7 kg 117.1 kg    Intake/Output:   Intake/Output Summary (Last 24 hours) at 12/30/2022 0811 Last data filed at 12/30/2022 0800 Gross per 24 hour  Intake 240 ml  Output 5250 ml  Net -5010 ml    Physical Exam: General:  well appearing, sitting on EOB.  No respiratory difficulty HEENT: normal Neck: supple. JVD difficult to see, does not appear elevated. Carotids 2+ bilat; no bruits. No lymphadenopathy or thyromegaly appreciated. Cor: PMI nondisplaced. Regular rate & rhythm. No rubs, gallops or murmurs. Lungs: exp wheezes Abdomen: soft, nontender, nondistended. No hepatosplenomegaly. No bruits or masses. Good bowel sounds. Extremities: no cyanosis, clubbing, rash, +1-2  BLE edema. + TED hose Neuro: alert & oriented x 3, cranial nerves grossly intact. moves all 4 extremities w/o difficulty. Affect pleasant.   Telemetry: NSR 80s (Personally reviewed)    Labs: Basic Metabolic Panel: Recent Labs  Lab 12/26/22 1612 12/28/22 0626 12/29/22 0418 12/30/22 0650  NA 134* 134* 132* 134*  K 3.7 3.2* 3.1* 3.5  CL 96* 94* 91* 90*  CO2 28 29 28 31   GLUCOSE 193* 153* 140* 143*  BUN 33* 37* 37* 36*  CREATININE 1.18 1.33* 1.21 1.32*  CALCIUM 7.9* 8.0* 8.2* 8.2*  MG  --  2.0 1.9 1.9    Liver Function  Tests: No results for input(s): "AST", "ALT", "ALKPHOS", "BILITOT", "PROT", "ALBUMIN" in the last 168 hours. No results for input(s): "LIPASE", "AMYLASE" in the last 168 hours. No results for input(s): "AMMONIA" in the last 168 hours.  CBC: Recent Labs  Lab 12/26/22 1612 12/28/22 0626 12/29/22 0418 12/30/22 0650  WBC 9.8 9.7 9.6 9.2  HGB 15.5 14.3 15.6 15.0  HCT 47.2 42.9 46.7 44.5  MCV 83.8 82.5 80.7 81.2  PLT 428* 441* 468* 441*    Cardiac Enzymes: No results for input(s): "CKTOTAL", "CKMB", "CKMBINDEX", "TROPONINI" in the last 168 hours.  BNP: BNP (last 3 results) Recent Labs    10/14/22 1326 10/17/22 0951 12/26/22 1619  BNP 534.9* 483.5* 827.9*   ProBNP (last 3 results) Recent Labs    12/17/22 1159  PROBNP 6,461*   Other results:  Imaging: No results found.  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 Cath with LAD 20%  LCx 50%d. OM4 75% RCA 100%m  -> PCI with 2 overlapping DES stents - Myoview 07/22: EF45% inferior scar. No ischemia  - Echo 08/15/20 EF 45-50% - Echo 04/17/22 EF 35-40% moderate LVH RV low normal - cMRI 12/25/22: LVEF 37%. Diffuse midwall LGE/scar Elevated native T1 and ECV values suggesting infiltrative disease. ECV 62%Moderately reduced RV function. - NYHA class IV with R> L HF symptoms - markedly fluid overloaded with poor response to IV lasix  - cMRI suggestive of amyloid with ECV 62%. Given age and rapid course, I am concerned for AL amyloidosis.  - Echo 12/27/22  EF 35-40%, LV with GHK, GIDD, RV low normal, LA mod dilated, RA mildly dilated, mod MR/TR - Diuresed well with IV lasix. Suspect we can stop lasix gtt later today. Would start Torsemide 40 mg BID (reports some noncompliance at home) - GDMT limited with soft BP. Continue midodrine 10 mg TID.  - Start SGLT2i 10 mg daily - Check SPEP/UPEP and serum free light chains (in process). If negative will need PYP - Consider skeletal survey for possible myeloma   2. OSA- - CPAP titration has been completed and he's now waiting on equipment to be delivered   3. DM2 - A1c 7.3% in 3/24 (primary care)   4: CAD  - Follows with Dr. Juliann Pares - continue clopidogrel 75mg  daily - Continue crestor 01/25 - no current s/s angina - LDL 11/19/22 was 172 - Cath 06/2016 with LAD 20% LCx 50%d. OM4 75% RCA 100%m  -> PCI with 2 overlapping DES stents   5. Tobacco use - smoking 5-6 cigars per day - encouraged cessation   Length of Stay: 4  Alen Bleacher 12/30/2022, 8:11 AM  Advanced Heart Failure Team Pager [phone] (M-F; 7a - 4p)  Please contact [facility] for night-coverage after hours (4p -7a ) and weekends on [email or web address]

## 2022-12-30 NOTE — Progress Notes (Unsigned)
MRN : [id number]  Jesse [name] is a 52 y.o. (March 26, 1970) male who presents with chief complaint of check circulation.  History of Present Illness:   The patient is seen for evaluation of painful lower extremities. Patient notes the pain is variable and not always associated with activity.  The pain is somewhat consistent day to day occurring on most days. The patient notes the pain also occurs with standing or sitting for long periods.  The extremity pain routinely seems worse as the day wears on. The pain has been progressive over the past several years. The patient states these symptoms are causing a negative impact on quality of life and daily activities which was a factor in the evaluation.  The patient has a history of back problems and DJD of the lumbar and sacral spine.   The patient denies rest pain or dangling of an extremity off the side of the bed during the night for relief. No open wounds or sores at this time. No history of DVT or phlebitis. No prior vascular interventions or surgeries.   No outpatient medications have been marked as taking for the 12/30/22 encounter (Appointment) with Jesse [name], Jesse Craver, MD.    Past Medical History:  Diagnosis Date   CHF (congestive heart failure) (HCC)    Coronary artery disease    Inferior myocardial infarction in 2004.  He was treated with thrombolytics at [facility].  He then had an inferior MI in March 2007.  He received a Horizon study stent 3.5 x 20 mm in the distal RCA at [facility].  No other obstructive disease.   Diabetes mellitus    TYPE II   Erectile dysfunction    Hyperlipidemia    Hypertension    MI (myocardial infarction) (HCC) 4034,7425   Morbid obesity (HCC)    Sleep apnea     Past Surgical History:  Procedure Laterality Date   CARDIAC CATHETERIZATION     @ [facility]   CHOLECYSTECTOMY     LEFT HEART CATH AND CORONARY ANGIOGRAPHY N/A 07/05/2016    Procedure: Left Heart Cath and Coronary Angiography;  Surgeon: Jesse Pea, MD;  Location: [facility] INVASIVE CV LAB;  Service: Cardiovascular;  Laterality: N/A;   SEPTOPLASTY      Social History Social History   Tobacco Use   Smoking status: Every Day    Current packs/day: 0.00    Average packs/day: 1 pack/day for 15.0 years (15.0 ttl pk-yrs)    Types: Cigars, Cigarettes    Start date: 2005    Last attempt to quit: 2020    Years since quitting: 4.9   Smokeless tobacco: Never  Vaping Use   Vaping status: Never Used  Substance Use Topics   Alcohol use: No   Drug use: No    Comment: prior hx in his 20's of cocaine and marijuana    Family History Family History  Adopted: Yes    No Known Allergies   REVIEW OF SYSTEMS (Negative unless checked)  Constitutional: [] Weight loss  [] Fever  [] Chills Cardiac: [] Chest pain   [] Chest pressure   [] Palpitations   [] Shortness  of breath when laying flat   [] Shortness of breath with exertion. Vascular:  [x] Pain in legs with walking   [] Pain in legs at rest  [] History of DVT   [] Phlebitis   [] Swelling in legs   [] Varicose veins   [] Non-healing ulcers Pulmonary:   [] Uses home oxygen   [] Productive cough   [] Hemoptysis   [] Wheeze  [] COPD   [] Asthma Neurologic:  [] Dizziness   [] Seizures   [] History of stroke   [] History of TIA  [] Aphasia   [] Vissual changes   [] Weakness or numbness in arm   [] Weakness or numbness in leg Musculoskeletal:   [] Joint swelling   [] Joint pain   [] Low back pain Hematologic:  [] Easy bruising  [] Easy bleeding   [] Hypercoagulable state   [] Anemic Gastrointestinal:  [] Diarrhea   [] Vomiting  [] Gastroesophageal reflux/heartburn   [] Difficulty swallowing. Genitourinary:  [] Chronic kidney disease   [] Difficult urination  [] Frequent urination   [] Blood in urine Skin:  [] Rashes   [] Ulcers  Psychological:  [] History of anxiety   []  History of major depression.  Physical Examination  There were no vitals filed for this  visit. There is no height or weight on file to calculate BMI. Gen: WD/WN, NAD Head: Jesse [name]/AT, No temporalis wasting.  Ear/Nose/Throat: Hearing grossly intact, nares w/o erythema or drainage Eyes: PER, EOMI, sclera nonicteric.  Neck: Supple, no masses.  No bruit or JVD.  Pulmonary:  Good air movement, no audible wheezing, no use of accessory muscles.  Cardiac: RRR, normal S1, S2, no Murmurs. Vascular:  mild trophic changes, no open wounds Vessel Right Left  Radial Palpable Palpable  PT Not Palpable Not Palpable  DP Not Palpable Not Palpable  Gastrointestinal: soft, non-distended. No guarding/no peritoneal signs.  Musculoskeletal: M/S 5/5 throughout.  No visible deformity.  Neurologic: CN 2-12 intact. Pain and light touch intact in extremities.  Symmetrical.  Speech is fluent. Motor exam as listed above. Psychiatric: Judgment intact, Mood & affect appropriate for pt's clinical situation. Dermatologic: No rashes or ulcers noted.  No changes consistent with cellulitis.   CBC Lab Results  Component Value Date   WBC 9.2 12/30/2022   HGB 15.0 12/30/2022   HCT 44.5 12/30/2022   MCV 81.2 12/30/2022   PLT 441 (H) 12/30/2022    BMET    Component Value Date/Time   NA 134 (L) 12/30/2022 0650   NA 141 12/17/2022 1159   K 3.5 12/30/2022 0650   CL 90 (L) 12/30/2022 0650   CO2 31 12/30/2022 0650   GLUCOSE 143 (H) 12/30/2022 0650   BUN 36 (H) 12/30/2022 0650   BUN 24 12/17/2022 1159   CREATININE 1.32 (H) 12/30/2022 0650   CALCIUM 8.2 (L) 12/30/2022 0650   GFRNONAA >60 12/30/2022 0650   GFRAA >60 03/08/2019 0453   Estimated Creatinine Clearance: 85.2 mL/min (A) (by C-G formula based on SCr of 1.32 mg/dL (H)).  COAG Lab Results  Component Value Date   INR 1.1 12/26/2022   INR 1.1 10/07/2021   INR 1.00 02/27/2017    Radiology MR FEMUR LEFT W WO CONTRAST  Result Date: 12/28/2022 CLINICAL DATA:  Bone lesion, femur, incidental. Palpable lump in the mid to distal left thigh for 2-3  months. History of multiple myeloma. EXAM: MR OF THE LEFT LOWER EXTREMITY WITHOUT AND WITH CONTRAST TECHNIQUE: Multiplanar, multisequence MR imaging of the left thigh was performed both before and after administration of intravenous contrast. CONTRAST:  10mL GADAVIST GADOBUTROL 1 MMOL/ML IV SOLN COMPARISON:  Lower extremity venous Doppler ultrasound 04/14/2022. Bone survey  12/27/2022. FINDINGS: Bones/Joint/Cartilage Both thighs are included on the coronal images. There is a broad-based exostosis involving the anterior aspect of the distal left femur, corresponding with the radiographic abnormality. This exostosis measures approximately 2.6 x 1.8 x 6.3 cm and is consistent with an incidental osteochondroma. No associated marrow enhancement, cartilaginous cap thickening or pseudo bursal formation. The left femur otherwise appears unremarkable. There are no suspicious osseous lesions. No evidence of acute fracture, dislocation or femoral head osteonecrosis. No significant arthropathy or effusions identified at either knee or hip. Ligaments No gross ligamentous abnormalities on large field-of-view imaging. Muscles and Tendons Mild left common hamstring tendinosis without tear. No focal muscular abnormalities are identified within the left thigh. Mild asymmetric T2 hyperintensity demonstrated within the right thigh musculature, greatest within the vastus lateralis. Soft tissues At least moderate diffuse subcutaneous edema is noted within both thighs. This appears fairly symmetric on the coronal images. No focal fluid collection or suspicious soft tissue enhancement identified. The left testis appears retracted into the left inguinal canal. This appears changed from pelvic CT 03/06/2019 and therefore not consistent with an incompletely descended testicle. Correlate clinically. IMPRESSION: 1. The palpable abnormality in the distal left thigh corresponds with an incidental osteochondroma. No suspicious osseous lesions  identified. 2. At least moderate diffuse subcutaneous edema within both thighs, nonspecific. No focal fluid collection or suspicious soft tissue enhancement identified. 3. Mild asymmetric T2 hyperintensity within the right thigh musculature, greatest within the vastus lateralis. This is nonspecific and could be secondary to subacute denervation, venous stasis or myositis. 4. The left testis appears retracted into the left inguinal canal. Correlate clinically. Electronically Signed   By: Carey Bullocks M.D.   On: 12/28/2022 08:43   DG Bone Survey Met  Result Date: 12/27/2022 CLINICAL DATA:  Multiple myeloma, posterior neck pain around C7, lump on left distal femur x2 months EXAM: METASTATIC BONE SURVEY COMPARISON:  None Available. FINDINGS: No lytic lesion in the calvarium. Bilateral upper extremities are within normal limits. Mild degenerative changes of the lower cervical spine, without lytic lesion. Thoracic spine is within normal limits. Mild degenerative changes of the upper lumbar spine, without lytic lesion. Lungs are clear.  The heart is normal in size. No lytic lesion in the bony pelvis. Right lower extremity is within normal limits. In the left lower extremity, there is an 8.5 cm mixed/sclerotic lesion in the lateral distal femoral shaft with overlying cortical thickening/periosteal reaction, with a benign appearance. This appearance favors a nonaggressive lesions such as nonossifying fibroma or less likely enchondroma. Multiple myeloma is considered unlikely. IMPRESSION: 8.5 cm mixed/sclerotic lesion in the lateral distal left femoral shaft, favoring a nonaggressive lesion such as nonossifying fibroma or less likely enchondroma. Multiple myeloma is considered unlikely. Otherwise negative multiple myeloma. Follow-up orthopedic consultation is suggested with dedicated femur radiographs prior to possible MR. Electronically Signed   By: Charline Bills M.D.   On: 12/27/2022 17:20   ECHOCARDIOGRAM  COMPLETE  Result Date: 12/27/2022    ECHOCARDIOGRAM REPORT   Patient Name:   BARTOLO BONACCORSO Date of Exam: 12/27/2022 Medical Rec #:  [id number]        Height:       71.0 in Accession #:    [id number]       Weight:       281.0 lb Date of Birth:  [birth year]        BSA:          2.436 m Patient Age:    14 years  BP:           98/73 mmHg Patient Gender: M                HR:           81 bpm. Exam Location:  [facility] Procedure: 2D Echo, 3D Echo, Cardiac Doppler, Color Doppler and Strain Analysis Indications:     CHF  History:         Patient has prior history of Echocardiogram examinations. CHF                  and Cardiomyopathy, CAD and Previous Myocardial Infarction,                  Signs/Symptoms:Dyspnea; Risk Factors:Hypertension, Diabetes,                  Dyslipidemia and Sleep Apnea.  Sonographer:     Mikki Harbor Referring Phys:  [id number] Andris Baumann Diagnosing Phys: Clotilde Dieter  Sonographer Comments: Patient is obese. Global longitudinal strain was attempted. IMPRESSIONS  1. Left ventricular ejection fraction, by estimation, is 35 to 40%. The left ventricle has moderately decreased function. The left ventricle demonstrates global hypokinesis. Left ventricular diastolic parameters are consistent with Grade I diastolic dysfunction (impaired relaxation).  2. Right ventricular systolic function is low normal. The right ventricular size is moderately enlarged. There is normal pulmonary artery systolic pressure. The estimated right ventricular systolic pressure is 29.2 mmHg.  3. Left atrial size was moderately dilated.  4. Right atrial size was mildly dilated.  5. The mitral valve is degenerative. Moderate mitral valve regurgitation.  6. Tricuspid valve regurgitation is moderate.  7. The aortic valve is normal in structure. Aortic valve regurgitation is not visualized. No aortic stenosis is present. FINDINGS  Left Ventricle: Left ventricular ejection fraction, by estimation, is 35 to 40%. The left  ventricle has moderately decreased function. The left ventricle demonstrates global hypokinesis. The left ventricular internal cavity size was normal in size. There is no left ventricular hypertrophy. Left ventricular diastolic parameters are consistent with Grade I diastolic dysfunction (impaired relaxation). Right Ventricle: The right ventricular size is moderately enlarged. No increase in right ventricular wall thickness. Right ventricular systolic function is low normal. There is normal pulmonary artery systolic pressure. The tricuspid regurgitant velocity  is 2.30 m/s, and with an assumed right atrial pressure of 8 mmHg, the estimated right ventricular systolic pressure is 29.2 mmHg. Left Atrium: Left atrial size was moderately dilated. Right Atrium: Right atrial size was mildly dilated. Pericardium: There is no evidence of pericardial effusion. Mitral Valve: The mitral valve is degenerative in appearance. Moderate mitral valve regurgitation. MV peak gradient, 8.0 mmHg. The mean mitral valve gradient is 3.0 mmHg. Tricuspid Valve: The tricuspid valve is grossly normal. Tricuspid valve regurgitation is moderate. Aortic Valve: The aortic valve is normal in structure. Aortic valve regurgitation is not visualized. No aortic stenosis is present. Aortic valve mean gradient measures 2.0 mmHg. Aortic valve peak gradient measures 3.1 mmHg. Aortic valve area, by VTI measures 2.15 cm. Pulmonic Valve: The pulmonic valve was grossly normal. Pulmonic valve regurgitation is not visualized. Aorta: The aortic root is normal in size and structure. IAS/Shunts: No atrial level shunt detected by color flow Doppler.  LEFT VENTRICLE PLAX 2D LVIDd:         5.30 cm LVIDs:         4.10 cm LV PW:         1.40 cm LV IVS:  1.40 cm LVOT diam:     2.20 cm LV SV:         36 LV SV Index:   15 LVOT Area:     3.80 cm  LV Volumes (MOD) LV vol d, MOD A2C: 98.6 ml LV vol d, MOD A4C: 117.0 ml LV vol s, MOD A2C: 71.0 ml LV vol s, MOD A4C: 73.1  ml LV SV MOD A2C:     27.6 ml LV SV MOD A4C:     117.0 ml LV SV MOD BP:      34.9 ml RIGHT VENTRICLE RV Basal diam:  4.05 cm RV Mid diam:    3.80 cm RV S prime:     9.34 cm/s LEFT ATRIUM             Index        RIGHT ATRIUM           Index LA diam:        5.20 cm 2.13 cm/m   RA Area:     22.10 cm LA Vol (A2C):   83.8 ml 34.40 ml/m  RA Volume:   67.50 ml  27.70 ml/m LA Vol (A4C):   93.6 ml 38.42 ml/m LA Biplane Vol: 95.8 ml 39.32 ml/m  AORTIC VALVE                    PULMONIC VALVE AV Area (Vmax):    2.90 cm     PV Vmax:       0.66 m/s AV Area (Vmean):   2.23 cm     PV Peak grad:  1.7 mmHg AV Area (VTI):     2.15 cm AV Vmax:           87.70 cm/s AV Vmean:          63.300 cm/s AV VTI:            0.168 m AV Peak Grad:      3.1 mmHg AV Mean Grad:      2.0 mmHg LVOT Vmax:         67.00 cm/s LVOT Vmean:        37.100 cm/s LVOT VTI:          0.095 m LVOT/AV VTI ratio: 0.56  AORTA Ao Root diam: 3.80 cm MITRAL VALVE                TRICUSPID VALVE MV Area (PHT): 3.99 cm     TR Peak grad:   21.2 mmHg MV Area VTI:   1.17 cm     TR Vmax:        230.00 cm/s MV Peak grad:  8.0 mmHg MV Mean grad:  3.0 mmHg     SHUNTS MV Vmax:       1.41 m/s     Systemic VTI:  0.09 m MV Vmean:      73.5 cm/s    Systemic Diam: 2.20 cm MV Decel Time: 190 msec MV E velocity: 136.00 cm/s Rozell Searing Custovic Electronically signed by Clotilde Dieter Signature Date/Time: 12/27/2022/1:22:46 PM    Final    DG Chest 2 View  Result Date: 12/26/2022 CLINICAL DATA:  Chest pain. EXAM: CHEST - 2 VIEW COMPARISON:  05/30/2022. FINDINGS: Low lung volume. There are diffuse increased interstitial markings when compared to the prior exam. Bilateral lung fields are otherwise clear. No acute consolidation or lung collapse. Bilateral costophrenic angles are clear. Stable cardio-mediastinal silhouette. No acute osseous abnormalities. The soft tissues are  within normal limits. IMPRESSION: *In appropriate clinical setting, findings favor mild congestive heart  failure/pulmonary edema. Electronically Signed   By: Jules Schick M.D.   On: 12/26/2022 18:28   MR CARDIAC MORPHOLOGY W WO CONTRAST  Result Date: 12/25/2022 CLINICAL DATA:  Cardiomyopathy, hx of CAD/PCI. Eval for infiltrative process. EXAM: CARDIAC MRI TECHNIQUE: The patient was scanned on a 1.5 Tesla Siemens magnet. A dedicated cardiac coil was used. Functional imaging was done using Fiesta sequences. 2,3, and 4 chamber views were done to assess for RWMA's. Modified Simpson's rule using a short axis stack was used to calculate an ejection fraction on a dedicated work [employer] officer, [employer]. The patient received 15 cc of Gadavist. After 10 minutes inversion recovery sequences were used to assess for infiltration and scar tissue. Velocity flow mapping performed in the ascending aorta and main pulmonary artery. CONTRAST:  15 cc  of Gadavist FINDINGS: 1. Mildly dilated left ventricular size, mild LV thickness, moderately reduced LV systolic function (LVEF = 37%). There is global hypokinesis. There is diffuse midwall late gadolinium enhancement in the left ventricular myocardium. LVEDV: 235 ml LVESV: 149 ml SV: 86 ml CO: 5L/min Myocardial mass: 207g LV native T1 value 1104 ms (normal <1000 ms) LV ECV value 62%.  Normal <30%. 2. Normal right ventricular size, thickness, moderately reduced systolic function (RVEF = 30%). There is global hypokinesis. 3.  Mildly dilated left atrial size. normal right atrial size. 4. Normal size of the aortic root, ascending aorta and pulmonary artery. 5. Mild mitral regurgitation, trivial pulmonary regurgitation. No significant valvular abnormalities. 6.  Normal pericardium.  trace pericardial effusion. IMPRESSION: 1. Mildly dilated LV, moderately reduced LV systolic function. LVEF 37%. 2.  Diffuse midwall LGE/scar 3. Elevated native T1 and ECV values suggesting infiltrative disease. 4.  Moderately reduced RV function. 5.  Trivial pericardial effusion 6. Findings suggest  infiltrative cardiomyopathy. Recommend complete workup for amyloidosis Electronically Signed   By: Debbe Odea M.D.   On: 12/25/2022 17:50   MR CARDIAC VELOCITY FLOW MAP  Result Date: 12/25/2022 CLINICAL DATA:  Cardiomyopathy, hx of CAD/PCI. Eval for infiltrative process. EXAM: CARDIAC MRI TECHNIQUE: The patient was scanned on a 1.5 Tesla Siemens magnet. A dedicated cardiac coil was used. Functional imaging was done using Fiesta sequences. 2,3, and 4 chamber views were done to assess for RWMA's. Modified Simpson's rule using a short axis stack was used to calculate an ejection fraction on a dedicated work [employer] officer, [employer]. The patient received 15 cc of Gadavist. After 10 minutes inversion recovery sequences were used to assess for infiltration and scar tissue. Velocity flow mapping performed in the ascending aorta and main pulmonary artery. CONTRAST:  15 cc  of Gadavist FINDINGS: 1. Mildly dilated left ventricular size, mild LV thickness, moderately reduced LV systolic function (LVEF = 37%). There is global hypokinesis. There is diffuse midwall late gadolinium enhancement in the left ventricular myocardium. LVEDV: 235 ml LVESV: 149 ml SV: 86 ml CO: 5L/min Myocardial mass: 207g LV native T1 value 1104 ms (normal <1000 ms) LV ECV value 62%.  Normal <30%. 2. Normal right ventricular size, thickness, moderately reduced systolic function (RVEF = 30%). There is global hypokinesis. 3.  Mildly dilated left atrial size. normal right atrial size. 4. Normal size of the aortic root, ascending aorta and pulmonary artery. 5. Mild mitral regurgitation, trivial pulmonary regurgitation. No significant valvular abnormalities. 6.  Normal pericardium.  trace pericardial effusion. IMPRESSION: 1. Mildly dilated LV, moderately reduced LV systolic function. LVEF 37%.  2.  Diffuse midwall LGE/scar 3. Elevated native T1 and ECV values suggesting infiltrative disease. 4.  Moderately reduced RV function. 5.  Trivial  pericardial effusion 6. Findings suggest infiltrative cardiomyopathy. Recommend complete workup for amyloidosis Electronically Signed   By: Debbe Odea M.D.   On: 12/25/2022 17:50   MR CARDIAC VELOCITY FLOW MAP  Result Date: 12/25/2022 CLINICAL DATA:  Cardiomyopathy, hx of CAD/PCI. Eval for infiltrative process. EXAM: CARDIAC MRI TECHNIQUE: The patient was scanned on a 1.5 Tesla Siemens magnet. A dedicated cardiac coil was used. Functional imaging was done using Fiesta sequences. 2,3, and 4 chamber views were done to assess for RWMA's. Modified Simpson's rule using a short axis stack was used to calculate an ejection fraction on a dedicated work [employer] officer, trade [employer]. The patient received 15 cc of Gadavist. After 10 minutes inversion recovery sequences were used to assess for infiltration and scar tissue. Velocity flow mapping performed in the ascending aorta and main pulmonary artery. CONTRAST:  15 cc  of Gadavist FINDINGS: 1. Mildly dilated left ventricular size, mild LV thickness, moderately reduced LV systolic function (LVEF = 37%). There is global hypokinesis. There is diffuse midwall late gadolinium enhancement in the left ventricular myocardium. LVEDV: 235 ml LVESV: 149 ml SV: 86 ml CO: 5L/min Myocardial mass: 207g LV native T1 value 1104 ms (normal <1000 ms) LV ECV value 62%.  Normal <30%. 2. Normal right ventricular size, thickness, moderately reduced systolic function (RVEF = 30%). There is global hypokinesis. 3.  Mildly dilated left atrial size. normal right atrial size. 4. Normal size of the aortic root, ascending aorta and pulmonary artery. 5. Mild mitral regurgitation, trivial pulmonary regurgitation. No significant valvular abnormalities. 6.  Normal pericardium.  trace pericardial effusion. IMPRESSION: 1. Mildly dilated LV, moderately reduced LV systolic function. LVEF 37%. 2.  Diffuse midwall LGE/scar 3. Elevated native T1 and ECV values suggesting infiltrative disease. 4.   Moderately reduced RV function. 5.  Trivial pericardial effusion 6. Findings suggest infiltrative cardiomyopathy. Recommend complete workup for amyloidosis Electronically Signed   By: Debbe Odea M.D.   On: 12/25/2022 17:50     Assessment/Plan There are no diagnoses linked to this encounter.   Levora Dredge, MD  12/30/2022 12:53 PM

## 2022-12-30 NOTE — Telephone Encounter (Signed)
Patient Product/process development scientist completed.    The patient is insured through [facility]. Patient has Medicare and is not eligible for a copay card, but may be able to apply for patient assistance, if available.    Ran test claim for Farxiga 10 mg and the current 30 day co-pay is $0.00.  Ran test claim for Jardiance 10 mg and the current 30 day co-pay is $0.00.  This test claim was processed through [facility]- copay amounts may vary at other pharmacies due to pharmacy/plan contracts, or as the patient moves through the different stages of their insurance plan.     Roland Earl, CPHT Pharmacy Technician III Certified Patient Advocate [facility] Patient Advocate Team Direct Number: [phone]  Fax: [phone]

## 2022-12-30 NOTE — Plan of Care (Signed)
  Problem: Fluid Volume: Goal: Ability to maintain a balanced intake and output will improve Outcome: Progressing   Problem: Tissue Perfusion: Goal: Adequacy of tissue perfusion will improve Outcome: Progressing   Problem: Nutrition: Goal: Adequate nutrition will be maintained Outcome: Progressing   Problem: Safety: Goal: Ability to remain free from injury will improve Outcome: Progressing   Problem: Cardiac: Goal: Ability to achieve and maintain adequate cardiopulmonary perfusion will improve Outcome: Progressing

## 2022-12-30 NOTE — Progress Notes (Signed)
[facility] CLINIC CARDIOLOGY PROGRESS NOTE       Patient ID: Jesse [name] MRN: [id number] DOB/AGE: 04-28-1970 52 y.o.  Admit date: 12/26/2022 Referring Physician Dr. Lindajo Royal Primary Physician Leanna Sato, MD  Primary Cardiologist Dr. Dorothyann Peng Reason for Consultation AoCHF, amyloid workup  HPI: Jesse [name] is a 52 y.o. male  with a past medical history of CAD (MI 2004/2007, inf STEMI 2018 s/p overlapping DES to RCA), DM2, hyperlipidemia, HTN, OSA, tobacco use and chronic heart failure who presented to the ED on 12/26/2022 for fluid overload. Cardiology was consulted for further evaluation.   Interval history: -Patient reports he is feeling much improved.  -Net negative 12.5L. Weight down 10 kg since admission.  -SOB improved, LE edema significantly improved.  -BP remains borderline, he is without dizziness/lightheadedness.   Review of systems complete and found to be negative unless listed above    Past Medical History:  Diagnosis Date   CHF (congestive heart failure) (HCC)    Coronary artery disease    Inferior myocardial infarction in 2004.  He was treated with thrombolytics at [facility].  He then had an inferior MI in March 2007.  He received a Horizon study stent 3.5 x 20 mm in the distal RCA at [facility].  No other obstructive disease.   Diabetes mellitus    TYPE II   Erectile dysfunction    Hyperlipidemia    Hypertension    MI (myocardial infarction) (HCC) 4098,1191   Morbid obesity (HCC)    Sleep apnea     Past Surgical History:  Procedure Laterality Date   CARDIAC CATHETERIZATION     @ [facility]   CHOLECYSTECTOMY     LEFT HEART CATH AND CORONARY ANGIOGRAPHY N/A 07/05/2016   Procedure: Left Heart Cath and Coronary Angiography;  Surgeon: Alwyn Pea, MD;  Location: [facility] INVASIVE CV LAB;  Service: Cardiovascular;  Laterality: N/A;   SEPTOPLASTY      Medications Prior to Admission  Medication Sig Dispense Refill Last Dose   clopidogrel (PLAVIX)  75 MG tablet Take 75 mg by mouth daily.   12/24/2022 at 1200   empagliflozin (JARDIANCE) 25 MG TABS tablet Take 25 mg by mouth daily.   12/24/2022   metoprolol succinate (TOPROL-XL) 25 MG 24 hr tablet Take 0.5 tablets (12.5 mg total) by mouth daily. Take with or immediately following a meal. 45 tablet 2 12/24/2022 at 1200   nitroGLYCERIN (NITROSTAT) 0.4 MG SL tablet Place 1 tablet (0.4 mg total) under the tongue every 5 (five) minutes as needed for chest pain. 30 tablet 0 prn   potassium chloride SA (KLOR-CON M) 20 MEQ tablet Take 1 tablet (20 mEq total) by mouth 3 (three) times a week.   Past Week   torsemide (DEMADEX) 20 MG tablet Take 2 tablets (40 mg total) by mouth 2 (two) times daily.   12/24/2022 at 1200   traMADol (ULTRAM) 50 MG tablet Take 50 mg by mouth every 6 (six) hours as needed for pain.   prn   AMBULATORY NON FORMULARY MEDICATION Trimix (30/1/10)-(Pap/Phent/PGE)  Test Dose  1ml vial   Qty #3 Refills 0  [facility] [phone] Fax [phone] (Patient not taking: Reported on 12/26/2022) 3 mL 0 Not Taking   Dulaglutide (TRULICITY) 1.5 MG/0.5ML SOPN Inject into the skin. (Patient not taking: Reported on 12/26/2022)      furosemide (LASIX) 40 MG tablet Take 40 mg by mouth 2 (two) times daily. (Patient not taking: Reported on 12/27/2022)   Not  Taking   lisinopril (ZESTRIL) 10 MG tablet Take 1 tablet by mouth daily. (Patient not taking: Reported on 12/27/2022)   Not Taking   rosuvastatin (CRESTOR) 20 MG tablet Take 20 mg by mouth daily. (Patient not taking: Reported on 12/27/2022)   Not Taking   spironolactone (ALDACTONE) 25 MG tablet Take 1 tablet (25 mg total) by mouth daily. (Patient not taking: Reported on 12/26/2022) 30 tablet 5 Not Taking   Social History   Socioeconomic History   Marital status: Married    Spouse name: Not on file   Number of children: Not on file   Years of education: Not on file   Highest education level: Not on file  Occupational History   Not on  file  Tobacco Use   Smoking status: Every Day    Current packs/day: 0.00    Average packs/day: 1 pack/day for 15.0 years (15.0 ttl pk-yrs)    Types: Cigars, Cigarettes    Start date: 2005    Last attempt to quit: 2020    Years since quitting: 4.9   Smokeless tobacco: Never  Vaping Use   Vaping status: Never Used  Substance and Sexual Activity   Alcohol use: No   Drug use: No    Comment: prior hx in his 20's of cocaine and marijuana   Sexual activity: Not on file  Other Topics Concern   Not on file  Social History Narrative   Not on file   Social Determinants of Health   Financial Resource Strain: Not on file  Food Insecurity: No Food Insecurity (12/27/2022)   Hunger Vital Sign    Worried About Running Out of Food in the Last Year: Never true    Ran Out of Food in the Last Year: Never true  Transportation Needs: No Transportation Needs (12/27/2022)   PRAPARE - Administrator, Civil Service (Medical): No    Lack of Transportation (Non-Medical): No  Physical Activity: Not on file  Stress: Not on file  Social Connections: Not on file  Intimate Partner Violence: Not At Risk (12/27/2022)   Humiliation, Afraid, Rape, and Kick questionnaire    Fear of Current or Ex-Partner: No    Emotionally Abused: No    Physically Abused: No    Sexually Abused: No    Family History  Adopted: Yes     Vitals:   12/30/22 0600 12/30/22 0627 12/30/22 0721 12/30/22 0748  BP:   102/75 101/79  Pulse:   79 81  Resp:   16 18  Temp:   97.8 F (36.6 C) 97.9 F (36.6 C)  TempSrc:   Oral Oral  SpO2:   100% 100%  Weight: 118.7 kg 117.1 kg    Height:        PHYSICAL EXAM General: Well appearing male, well nourished, in no acute distress. HEENT: Normocephalic and atraumatic. Neck: No JVD.  Lungs: Normal respiratory effort on room air. Clear bilaterally to auscultation. No wheezes, crackles, rhonchi.  Heart: HRRR. Normal S1 and S2 without gallops or murmurs.  Abdomen:  Non-distended. Msk: Normal strength and tone for age. Extremities: Warm and well perfused. No clubbing, cyanosis. 2+ pitting edema bilaterally, significantly improved. Ted hose in place. Neuro: Alert and oriented X 3. Psych: Answers questions appropriately.   Labs: Basic Metabolic Panel: Recent Labs    12/29/22 0418 12/30/22 0650  NA 132* 134*  K 3.1* 3.5  CL 91* 90*  CO2 28 31  GLUCOSE 140* 143*  BUN 37* 36*  CREATININE  1.21 1.32*  CALCIUM 8.2* 8.2*  MG 1.9 1.9   Liver Function Tests: No results for input(s): "AST", "ALT", "ALKPHOS", "BILITOT", "PROT", "ALBUMIN" in the last 72 hours. No results for input(s): "LIPASE", "AMYLASE" in the last 72 hours. CBC: Recent Labs    12/29/22 0418 12/30/22 0650  WBC 9.6 9.2  HGB 15.6 15.0  HCT 46.7 44.5  MCV 80.7 81.2  PLT 468* 441*   Cardiac Enzymes: No results for input(s): "CKTOTAL", "CKMB", "CKMBINDEX", "TROPONINIHS" in the last 72 hours.  BNP: No results for input(s): "BNP" in the last 72 hours.  D-Dimer: No results for input(s): "DDIMER" in the last 72 hours. Hemoglobin A1C: No results for input(s): "HGBA1C" in the last 72 hours.  Fasting Lipid Panel: No results for input(s): "CHOL", "HDL", "LDLCALC", "TRIG", "CHOLHDL", "LDLDIRECT" in the last 72 hours. Thyroid Function Tests: No results for input(s): "TSH", "T4TOTAL", "T3FREE", "THYROIDAB" in the last 72 hours.  Invalid input(s): "FREET3" Anemia Panel: No results for input(s): "VITAMINB12", "FOLATE", "FERRITIN", "TIBC", "IRON", "RETICCTPCT" in the last 72 hours.   Radiology: MR FEMUR LEFT W WO CONTRAST  Result Date: 12/28/2022 CLINICAL DATA:  Bone lesion, femur, incidental. Palpable lump in the mid to distal left thigh for 2-3 months. History of multiple myeloma. EXAM: MR OF THE LEFT LOWER EXTREMITY WITHOUT AND WITH CONTRAST TECHNIQUE: Multiplanar, multisequence MR imaging of the left thigh was performed both before and after administration of intravenous contrast.  CONTRAST:  10mL GADAVIST GADOBUTROL 1 MMOL/ML IV SOLN COMPARISON:  Lower extremity venous Doppler ultrasound 04/14/2022. Bone survey 12/27/2022. FINDINGS: Bones/Joint/Cartilage Both thighs are included on the coronal images. There is a broad-based exostosis involving the anterior aspect of the distal left femur, corresponding with the radiographic abnormality. This exostosis measures approximately 2.6 x 1.8 x 6.3 cm and is consistent with an incidental osteochondroma. No associated marrow enhancement, cartilaginous cap thickening or pseudo bursal formation. The left femur otherwise appears unremarkable. There are no suspicious osseous lesions. No evidence of acute fracture, dislocation or femoral head osteonecrosis. No significant arthropathy or effusions identified at either knee or hip. Ligaments No gross ligamentous abnormalities on large field-of-view imaging. Muscles and Tendons Mild left common hamstring tendinosis without tear. No focal muscular abnormalities are identified within the left thigh. Mild asymmetric T2 hyperintensity demonstrated within the right thigh musculature, greatest within the vastus lateralis. Soft tissues At least moderate diffuse subcutaneous edema is noted within both thighs. This appears fairly symmetric on the coronal images. No focal fluid collection or suspicious soft tissue enhancement identified. The left testis appears retracted into the left inguinal canal. This appears changed from pelvic CT 03/06/2019 and therefore not consistent with an incompletely descended testicle. Correlate clinically. IMPRESSION: 1. The palpable abnormality in the distal left thigh corresponds with an incidental osteochondroma. No suspicious osseous lesions identified. 2. At least moderate diffuse subcutaneous edema within both thighs, nonspecific. No focal fluid collection or suspicious soft tissue enhancement identified. 3. Mild asymmetric T2 hyperintensity within the right thigh musculature,  greatest within the vastus lateralis. This is nonspecific and could be secondary to subacute denervation, venous stasis or myositis. 4. The left testis appears retracted into the left inguinal canal. Correlate clinically. Electronically Signed   By: Carey Bullocks M.D.   On: 12/28/2022 08:43   DG Bone Survey Met  Result Date: 12/27/2022 CLINICAL DATA:  Multiple myeloma, posterior neck pain around C7, lump on left distal femur x2 months EXAM: METASTATIC BONE SURVEY COMPARISON:  None Available. FINDINGS: No lytic lesion in the calvarium.  Bilateral upper extremities are within normal limits. Mild degenerative changes of the lower cervical spine, without lytic lesion. Thoracic spine is within normal limits. Mild degenerative changes of the upper lumbar spine, without lytic lesion. Lungs are clear.  The heart is normal in size. No lytic lesion in the bony pelvis. Right lower extremity is within normal limits. In the left lower extremity, there is an 8.5 cm mixed/sclerotic lesion in the lateral distal femoral shaft with overlying cortical thickening/periosteal reaction, with a benign appearance. This appearance favors a nonaggressive lesions such as nonossifying fibroma or less likely enchondroma. Multiple myeloma is considered unlikely. IMPRESSION: 8.5 cm mixed/sclerotic lesion in the lateral distal left femoral shaft, favoring a nonaggressive lesion such as nonossifying fibroma or less likely enchondroma. Multiple myeloma is considered unlikely. Otherwise negative multiple myeloma. Follow-up orthopedic consultation is suggested with dedicated femur radiographs prior to possible MR. Electronically Signed   By: Charline Bills M.D.   On: 12/27/2022 17:20   ECHOCARDIOGRAM COMPLETE  Result Date: 12/27/2022    ECHOCARDIOGRAM REPORT   Patient Name:   Jesse [name] Date of Exam: 12/27/2022 Medical Rec #:  [id number]        Height:       71.0 in Accession #:    [id number]       Weight:       281.0 lb Date of Birth:   09-15-1970        BSA:          2.436 m Patient Age:    52 years         BP:           98/73 mmHg Patient Gender: M                HR:           81 bpm. Exam Location:  [facility] Procedure: 2D Echo, 3D Echo, Cardiac Doppler, Color Doppler and Strain Analysis Indications:     CHF  History:         Patient has prior history of Echocardiogram examinations. CHF                  and Cardiomyopathy, CAD and Previous Myocardial Infarction,                  Signs/Symptoms:Dyspnea; Risk Factors:Hypertension, Diabetes,                  Dyslipidemia and Sleep Apnea.  Sonographer:     Mikki Harbor Referring Phys:  [id number] Andris Baumann Diagnosing Phys: Clotilde Dieter  Sonographer Comments: Patient is obese. Global longitudinal strain was attempted. IMPRESSIONS  1. Left ventricular ejection fraction, by estimation, is 35 to 40%. The left ventricle has moderately decreased function. The left ventricle demonstrates global hypokinesis. Left ventricular diastolic parameters are consistent with Grade I diastolic dysfunction (impaired relaxation).  2. Right ventricular systolic function is low normal. The right ventricular size is moderately enlarged. There is normal pulmonary artery systolic pressure. The estimated right ventricular systolic pressure is 29.2 mmHg.  3. Left atrial size was moderately dilated.  4. Right atrial size was mildly dilated.  5. The mitral valve is degenerative. Moderate mitral valve regurgitation.  6. Tricuspid valve regurgitation is moderate.  7. The aortic valve is normal in structure. Aortic valve regurgitation is not visualized. No aortic stenosis is present. FINDINGS  Left Ventricle: Left ventricular ejection fraction, by estimation, is 35 to 40%. The left ventricle has moderately decreased function. The  left ventricle demonstrates global hypokinesis. The left ventricular internal cavity size was normal in size. There is no left ventricular hypertrophy. Left ventricular diastolic parameters are  consistent with Grade I diastolic dysfunction (impaired relaxation). Right Ventricle: The right ventricular size is moderately enlarged. No increase in right ventricular wall thickness. Right ventricular systolic function is low normal. There is normal pulmonary artery systolic pressure. The tricuspid regurgitant velocity  is 2.30 m/s, and with an assumed right atrial pressure of 8 mmHg, the estimated right ventricular systolic pressure is 29.2 mmHg. Left Atrium: Left atrial size was moderately dilated. Right Atrium: Right atrial size was mildly dilated. Pericardium: There is no evidence of pericardial effusion. Mitral Valve: The mitral valve is degenerative in appearance. Moderate mitral valve regurgitation. MV peak gradient, 8.0 mmHg. The mean mitral valve gradient is 3.0 mmHg. Tricuspid Valve: The tricuspid valve is grossly normal. Tricuspid valve regurgitation is moderate. Aortic Valve: The aortic valve is normal in structure. Aortic valve regurgitation is not visualized. No aortic stenosis is present. Aortic valve mean gradient measures 2.0 mmHg. Aortic valve peak gradient measures 3.1 mmHg. Aortic valve area, by VTI measures 2.15 cm. Pulmonic Valve: The pulmonic valve was grossly normal. Pulmonic valve regurgitation is not visualized. Aorta: The aortic root is normal in size and structure. IAS/Shunts: No atrial level shunt detected by color flow Doppler.  LEFT VENTRICLE PLAX 2D LVIDd:         5.30 cm LVIDs:         4.10 cm LV PW:         1.40 cm LV IVS:        1.40 cm LVOT diam:     2.20 cm LV SV:         36 LV SV Index:   15 LVOT Area:     3.80 cm  LV Volumes (MOD) LV vol d, MOD A2C: 98.6 ml LV vol d, MOD A4C: 117.0 ml LV vol s, MOD A2C: 71.0 ml LV vol s, MOD A4C: 73.1 ml LV SV MOD A2C:     27.6 ml LV SV MOD A4C:     117.0 ml LV SV MOD BP:      34.9 ml RIGHT VENTRICLE RV Basal diam:  4.05 cm RV Mid diam:    3.80 cm RV S prime:     9.34 cm/s LEFT ATRIUM             Index        RIGHT ATRIUM           Index  LA diam:        5.20 cm 2.13 cm/m   RA Area:     22.10 cm LA Vol (A2C):   83.8 ml 34.40 ml/m  RA Volume:   67.50 ml  27.70 ml/m LA Vol (A4C):   93.6 ml 38.42 ml/m LA Biplane Vol: 95.8 ml 39.32 ml/m  AORTIC VALVE                    PULMONIC VALVE AV Area (Vmax):    2.90 cm     PV Vmax:       0.66 m/s AV Area (Vmean):   2.23 cm     PV Peak grad:  1.7 mmHg AV Area (VTI):     2.15 cm AV Vmax:           87.70 cm/s AV Vmean:          63.300 cm/s AV VTI:  0.168 m AV Peak Grad:      3.1 mmHg AV Mean Grad:      2.0 mmHg LVOT Vmax:         67.00 cm/s LVOT Vmean:        37.100 cm/s LVOT VTI:          0.095 m LVOT/AV VTI ratio: 0.56  AORTA Ao Root diam: 3.80 cm MITRAL VALVE                TRICUSPID VALVE MV Area (PHT): 3.99 cm     TR Peak grad:   21.2 mmHg MV Area VTI:   1.17 cm     TR Vmax:        230.00 cm/s MV Peak grad:  8.0 mmHg MV Mean grad:  3.0 mmHg     SHUNTS MV Vmax:       1.41 m/s     Systemic VTI:  0.09 m MV Vmean:      73.5 cm/s    Systemic Diam: 2.20 cm MV Decel Time: 190 msec MV E velocity: 136.00 cm/s Rozell Searing Custovic Electronically signed by Clotilde Dieter Signature Date/Time: 12/27/2022/1:22:46 PM    Final    DG Chest 2 View  Result Date: 12/26/2022 CLINICAL DATA:  Chest pain. EXAM: CHEST - 2 VIEW COMPARISON:  05/30/2022. FINDINGS: Low lung volume. There are diffuse increased interstitial markings when compared to the prior exam. Bilateral lung fields are otherwise clear. No acute consolidation or lung collapse. Bilateral costophrenic angles are clear. Stable cardio-mediastinal silhouette. No acute osseous abnormalities. The soft tissues are within normal limits. IMPRESSION: *In appropriate clinical setting, findings favor mild congestive heart failure/pulmonary edema. Electronically Signed   By: Jules Schick M.D.   On: 12/26/2022 18:28   MR CARDIAC MORPHOLOGY W WO CONTRAST  Result Date: 12/25/2022 CLINICAL DATA:  Cardiomyopathy, hx of CAD/PCI. Eval for infiltrative process. EXAM:  CARDIAC MRI TECHNIQUE: The patient was scanned on a 1.5 Tesla Siemens magnet. A dedicated cardiac coil was used. Functional imaging was done using Fiesta sequences. 2,3, and 4 chamber views were done to assess for RWMA's. Modified Simpson's rule using a short axis stack was used to calculate an ejection fraction on a dedicated work [employer], [employer]. The patient received 15 cc of Gadavist. After 10 minutes inversion recovery sequences were used to assess for infiltration and scar tissue. Velocity flow mapping performed in the ascending aorta and main pulmonary artery. CONTRAST:  15 cc  of Gadavist FINDINGS: 1. Mildly dilated left ventricular size, mild LV thickness, moderately reduced LV systolic function (LVEF = 37%). There is global hypokinesis. There is diffuse midwall late gadolinium enhancement in the left ventricular myocardium. LVEDV: 235 ml LVESV: 149 ml SV: 86 ml CO: 5L/min Myocardial mass: 207g LV native T1 value 1104 ms (normal <1000 ms) LV ECV value 62%.  Normal <30%. 2. Normal right ventricular size, thickness, moderately reduced systolic function (RVEF = 30%). There is global hypokinesis. 3.  Mildly dilated left atrial size. normal right atrial size. 4. Normal size of the aortic root, ascending aorta and pulmonary artery. 5. Mild mitral regurgitation, trivial pulmonary regurgitation. No significant valvular abnormalities. 6.  Normal pericardium.  trace pericardial effusion. IMPRESSION: 1. Mildly dilated LV, moderately reduced LV systolic function. LVEF 37%. 2.  Diffuse midwall LGE/scar 3. Elevated native T1 and ECV values suggesting infiltrative disease. 4.  Moderately reduced RV function. 5.  Trivial pericardial effusion 6. Findings suggest infiltrative cardiomyopathy. Recommend complete workup for amyloidosis Electronically Signed  By: Debbe Odea M.D.   On: 12/25/2022 17:50   MR CARDIAC VELOCITY FLOW MAP  Result Date: 12/25/2022 CLINICAL DATA:  Cardiomyopathy, hx of CAD/PCI.  Eval for infiltrative process. EXAM: CARDIAC MRI TECHNIQUE: The patient was scanned on a 1.5 Tesla Siemens magnet. A dedicated cardiac coil was used. Functional imaging was done using Fiesta sequences. 2,3, and 4 chamber views were done to assess for RWMA's. Modified Simpson's rule using a short axis stack was used to calculate an ejection fraction on a dedicated work [employer] officer, [employer]. The patient received 15 cc of Gadavist. After 10 minutes inversion recovery sequences were used to assess for infiltration and scar tissue. Velocity flow mapping performed in the ascending aorta and main pulmonary artery. CONTRAST:  15 cc  of Gadavist FINDINGS: 1. Mildly dilated left ventricular size, mild LV thickness, moderately reduced LV systolic function (LVEF = 37%). There is global hypokinesis. There is diffuse midwall late gadolinium enhancement in the left ventricular myocardium. LVEDV: 235 ml LVESV: 149 ml SV: 86 ml CO: 5L/min Myocardial mass: 207g LV native T1 value 1104 ms (normal <1000 ms) LV ECV value 62%.  Normal <30%. 2. Normal right ventricular size, thickness, moderately reduced systolic function (RVEF = 30%). There is global hypokinesis. 3.  Mildly dilated left atrial size. normal right atrial size. 4. Normal size of the aortic root, ascending aorta and pulmonary artery. 5. Mild mitral regurgitation, trivial pulmonary regurgitation. No significant valvular abnormalities. 6.  Normal pericardium.  trace pericardial effusion. IMPRESSION: 1. Mildly dilated LV, moderately reduced LV systolic function. LVEF 37%. 2.  Diffuse midwall LGE/scar 3. Elevated native T1 and ECV values suggesting infiltrative disease. 4.  Moderately reduced RV function. 5.  Trivial pericardial effusion 6. Findings suggest infiltrative cardiomyopathy. Recommend complete workup for amyloidosis Electronically Signed   By: Debbe Odea M.D.   On: 12/25/2022 17:50   MR CARDIAC VELOCITY FLOW MAP  Result Date: 12/25/2022 CLINICAL  DATA:  Cardiomyopathy, hx of CAD/PCI. Eval for infiltrative process. EXAM: CARDIAC MRI TECHNIQUE: The patient was scanned on a 1.5 Tesla Siemens magnet. A dedicated cardiac coil was used. Functional imaging was done using Fiesta sequences. 2,3, and 4 chamber views were done to assess for RWMA's. Modified Simpson's rule using a short axis stack was used to calculate an ejection fraction on a dedicated work [employer] officer, [employer]. The patient received 15 cc of Gadavist. After 10 minutes inversion recovery sequences were used to assess for infiltration and scar tissue. Velocity flow mapping performed in the ascending aorta and main pulmonary artery. CONTRAST:  15 cc  of Gadavist FINDINGS: 1. Mildly dilated left ventricular size, mild LV thickness, moderately reduced LV systolic function (LVEF = 37%). There is global hypokinesis. There is diffuse midwall late gadolinium enhancement in the left ventricular myocardium. LVEDV: 235 ml LVESV: 149 ml SV: 86 ml CO: 5L/min Myocardial mass: 207g LV native T1 value 1104 ms (normal <1000 ms) LV ECV value 62%.  Normal <30%. 2. Normal right ventricular size, thickness, moderately reduced systolic function (RVEF = 30%). There is global hypokinesis. 3.  Mildly dilated left atrial size. normal right atrial size. 4. Normal size of the aortic root, ascending aorta and pulmonary artery. 5. Mild mitral regurgitation, trivial pulmonary regurgitation. No significant valvular abnormalities. 6.  Normal pericardium.  trace pericardial effusion. IMPRESSION: 1. Mildly dilated LV, moderately reduced LV systolic function. LVEF 37%. 2.  Diffuse midwall LGE/scar 3. Elevated native T1 and ECV values suggesting infiltrative disease. 4.  Moderately reduced RV  function. 5.  Trivial pericardial effusion 6. Findings suggest infiltrative cardiomyopathy. Recommend complete workup for amyloidosis Electronically Signed   By: Debbe Odea M.D.   On: 12/25/2022 17:50    ECHO as above  TELEMETRY  reviewed by me 12/30/2022: sinus rhythm rate 80s  EKG reviewed by me: sinus rhythm rate 92 bpm, PVCs, nonspecific ST-T changes  Data reviewed by me 12/30/2022: last 24h vitals tele labs imaging I/O hospitalist progress note  Principal Problem:   Acute on chronic systolic CHF (congestive heart failure) (HCC) Active Problems:   CAD with hx of STEMI 2018 S/P RCA stent   Diabetes mellitus (HCC)   OSA (obstructive sleep apnea)   Cardiomyopathy, ischemic and infiltrative (HCC)   Hypotension   Obesity, Class III, BMI 40-49.9 (morbid obesity) (HCC)   Elevated troponin    ASSESSMENT AND PLAN:   Jesse [name] is a 52 y.o. male  with a past medical history of CAD (MI 2004/2007, inf STEMI 2018 s/p overlapping DES to RCA), DM2, hyperlipidemia, HTN, OSA, tobacco use and chronic heart failure who presented to the ED on 12/26/2022 for fluid overload. Cardiology was consulted for further evaluation.   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 Worsening edema for a few weeks, no significant change in baseline SOB/orthopnea. BNP elevated at 827. Troponins 144 > 202. CXR with mild vascular congestion. Borderline hypotensive since admission, no dizziness/lightheadedness.  -DC IV lasix and start po torsemide 40 mg bid per advanced HF. Also starting farxiga.  -Hold home metoprolol. Consider further additions to GDMT as BP and renal function allow.  -Kappa/lambda light chains, Urine immunofixation, and multiple myeloma labs pending for additional evaluation of amyloidosis. -Troponin elevation most consistent with demand/supply mismatch and not ACS in the setting of acute heart failure. -Advanced heart failure team following, appreciate their input. Recommending PYP and TTR genetic panel pending amyloid  lab results.  # Coronary artery disease # Hyperlipidemia Patient with hx of CAD, STEMI in 2018 with overlapping DES to RCA. Denies any current or recent CP, anginal symptoms.  -Continue aspirin 81 mg daily and plavix 75 mg daily.  -Patient has not been taking statin outpatient.  # Obstructive sleep apnea Patient with hx of OSA, wears BiPAP nightly at home.  -Continue home OSA treatment.   This patient's plan of care was discussed and created with Dr. Juliann Pares and she is in agreement.  Signed: Gale Journey, PA-C  12/30/2022, 11:07 AM [facility]

## 2022-12-31 DIAGNOSIS — I5023 Acute on chronic systolic (congestive) heart failure: Secondary | ICD-10-CM | POA: Diagnosis not present

## 2022-12-31 LAB — CBC
HCT: 45.9 % (ref 39.0–52.0)
Hemoglobin: 15.3 g/dL (ref 13.0–17.0)
MCH: 27.2 pg (ref 26.0–34.0)
MCHC: 33.3 g/dL (ref 30.0–36.0)
MCV: 81.7 fL (ref 80.0–100.0)
Platelets: 463 10*3/uL — ABNORMAL HIGH (ref 150–400)
RBC: 5.62 MIL/uL (ref 4.22–5.81)
RDW: 16.2 % — ABNORMAL HIGH (ref 11.5–15.5)
WBC: 9.3 10*3/uL (ref 4.0–10.5)
nRBC: 0 % (ref 0.0–0.2)

## 2022-12-31 LAB — KAPPA/LAMBDA LIGHT CHAINS
Kappa free light chain: 54 mg/L — ABNORMAL HIGH (ref 3.3–19.4)
Kappa, lambda light chain ratio: 0.12 — ABNORMAL LOW (ref 0.26–1.65)
Lambda free light chains: 433.2 mg/L — ABNORMAL HIGH (ref 5.7–26.3)

## 2022-12-31 LAB — BASIC METABOLIC PANEL
Anion gap: 11 (ref 5–15)
BUN: 36 mg/dL — ABNORMAL HIGH (ref 6–20)
CO2: 35 mmol/L — ABNORMAL HIGH (ref 22–32)
Calcium: 8.2 mg/dL — ABNORMAL LOW (ref 8.9–10.3)
Chloride: 90 mmol/L — ABNORMAL LOW (ref 98–111)
Creatinine, Ser: 1.23 mg/dL (ref 0.61–1.24)
GFR, Estimated: >60 mL/min (ref >60)
Glucose, Bld: 142 mg/dL — ABNORMAL HIGH (ref 70–99)
Potassium: 4.5 mmol/L (ref 3.5–5.1)
Sodium: 136 mmol/L (ref 135–145)

## 2022-12-31 LAB — MAGNESIUM: Magnesium: 2 mg/dL (ref 1.7–2.4)

## 2022-12-31 MED ORDER — METOPROLOL SUCCINATE ER 25 MG PO TB24: 12.5000 mg | ORAL_TABLET | Freq: Every day | ORAL | Status: DC

## 2022-12-31 MED ORDER — METOPROLOL SUCCINATE ER 25 MG PO TB24
12.5000 mg | ORAL_TABLET | Freq: Every day | ORAL | Status: DC
  Administered 2022-12-31: 12.5 mg via ORAL
  Filled 2022-12-31: qty 1

## 2022-12-31 MED ORDER — TORSEMIDE 20 MG PO TABS: 40.0000 mg | ORAL_TABLET | Freq: Two times a day (BID) | ORAL | 3 refills | Status: DC

## 2022-12-31 MED ORDER — SPIRONOLACTONE 25 MG PO TABS: 12.5000 mg | ORAL_TABLET | Freq: Every day | ORAL | Status: DC

## 2022-12-31 MED ORDER — MIDODRINE HCL 5 MG PO TABS
5.0000 mg | ORAL_TABLET | Freq: Three times a day (TID) | ORAL | Status: DC
  Filled 2022-12-31: qty 1

## 2022-12-31 MED ORDER — MIDODRINE HCL 5 MG PO TABS: 5.0000 mg | ORAL_TABLET | Freq: Three times a day (TID) | ORAL | 0 refills | Status: DC

## 2022-12-31 MED ORDER — ASPIRIN 81 MG PO TBEC: 81.0000 mg | DELAYED_RELEASE_TABLET | Freq: Every day | ORAL | 2 refills | Status: DC

## 2022-12-31 MED ORDER — EMPAGLIFLOZIN 25 MG PO TABS: 25.0000 mg | ORAL_TABLET | Freq: Every day | ORAL | 2 refills | Status: DC

## 2022-12-31 MED ORDER — CLOPIDOGREL BISULFATE 75 MG PO TABS: 75.0000 mg | ORAL_TABLET | Freq: Every day | ORAL | 2 refills | Status: DC

## 2022-12-31 MED ORDER — SPIRONOLACTONE 12.5 MG HALF TABLET
12.5000 mg | ORAL_TABLET | Freq: Every day | ORAL | Status: DC
  Filled 2022-12-31: qty 1

## 2022-12-31 NOTE — Discharge Summary (Signed)
Physician Discharge Summary   Jesse [name]  male DOB: [birth year]  FUX:[id number]  PCP: Leanna Sato, MD  Admit date: 12/26/2022 Discharge date: 12/31/2022  Admitted From: home Disposition:  home CODE STATUS: Full code  Discharge Instructions     Diet - low sodium heart healthy   Complete by: As directed       Hospital Course:  For full details, please see H&P, progress notes, consult notes and ancillary notes.  Briefly,  Jesse [name] is a 52 y.o. 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  He was fluid overloaded and also hypotensive to 84/62.  With sats of 100%.    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  Pt said home torsemide did work to [employer], however, he admitted to non-compliance.  Presented with swelling in abdomen and BLE.  BNP elevated at 827.  CXR with mild vascular congestion.  --started on lasix gtt, and then bolus IV lasix 160 mg and metolazone on 12/6 to kick start. --follows with [facility].  Heart failure team assisting. --pt responded well to diuresis with lasix gtt.  Net negative 14.9L. Weight down 10 kg.  SOB improved, LE edema significantly improved.  --pt was discharged back on home torsemide 40 mg BID and Aldactone 12.5 daily.  Home Toprol held during aggressive diuresis due to hypotension, to be resumed after discharge per [facility] cardio.  Continue Jardiance 10 mg daily.  Further additions to GDMT limited by borderline BP, renal function.  Not taking Lasix and Lisinopril PTA.   Hypotension Systolic  in the 02R at heart failure clinic, 103 on arrival to the ED --started on midodrine 10 mg TID, and discharged on midorine 5 TID.   CAD with hx of STEMI 2018 S/P RCA stent --cont asa and plavix   Elevated troponin, suspect demand ischemia Patient denies chest pain and EKG nonacute Trop 144 and 202   Obesity, Class III, BMI 40-49.9 (morbid obesity) (HCC) Complicating factor to overall prognosis and care   OSA (obstructive sleep apnea) Currently awaiting delivery of home CPAP   Diabetes mellitus (HCC) --A1c 7.7 --BG has been within inpatient goals --pt not taking Dulaglutide PTA.   Hypokalemia --due to diuesis --monitored and supplemented PRN   Unless noted above, medications under "STOP" list are ones pt was not taking PTA.  Discharge Diagnoses:  Principal Problem:   Acute on chronic systolic CHF (congestive heart failure) (HCC) Active Problems:   Cardiomyopathy, ischemic and infiltrative (HCC)   CAD with hx of STEMI 2018 S/P RCA stent   Elevated troponin   Hypotension   Diabetes mellitus (HCC)   OSA (obstructive sleep apnea)   Obesity, Class III, BMI 40-49.9 (morbid obesity) (HCC)   30 Day Unplanned Readmission Risk Score    Flowsheet Row ED to Hosp-Admission (Current) from 12/26/2022 in [facility]  30 Day Unplanned Readmission Risk Score (%) 22.59 Filed at 12/31/2022 0801       This score is the patient's risk of an unplanned readmission within 30 days of being discharged (0 -100%). The score is based on dignosis, age,  lab data, medications, orders, and past utilization.   Low:  0-14.9   Medium: 15-21.9   High: 22-29.9   Extreme: 30 and above         Discharge Instructions:  Allergies as of 12/31/2022   No Known Allergies      Medication List     STOP taking these medications    AMBULATORY NON FORMULARY MEDICATION   furosemide 40 MG tablet Commonly known as: LASIX   lisinopril 10 MG tablet Commonly known as: ZESTRIL    Trulicity 0.75 MG/0.5ML Soaj       TAKE these medications    aspirin EC 81 MG tablet Take 1 tablet (81 mg total) by mouth daily. Swallow whole.   clopidogrel 75 MG tablet Commonly known as: PLAVIX Take 1 tablet (75 mg total) by mouth daily.   empagliflozin 25 MG Tabs tablet Commonly known as: Jardiance Take 1 tablet (25 mg total) by mouth daily.   metoprolol succinate 25 MG 24 hr tablet Commonly known as: TOPROL-XL Take 0.5 tablets (12.5 mg total) by mouth daily. Take with or immediately following a meal.   midodrine 5 MG tablet Commonly known as: PROAMATINE Take 1 tablet (5 mg total) by mouth 3 (three) times daily with meals.   nitroGLYCERIN 0.4 MG SL tablet Commonly known as: NITROSTAT Place 1 tablet (0.4 mg total) under the tongue every 5 (five) minutes as needed for chest pain.   potassium chloride SA 20 MEQ tablet Commonly known as: KLOR-CON M Take 1 tablet (20 mEq total) by mouth 3 (three) times a week.   rosuvastatin 20 MG tablet Commonly known as: CRESTOR Take 20 mg by mouth daily.   spironolactone 25 MG tablet Commonly known as: ALDACTONE Take 0.5 tablets (12.5 mg total) by mouth daily. What changed: how much to take   torsemide 20 MG tablet Commonly known as: DEMADEX Take 2 tablets (40 mg total) by mouth 2 (two) times daily.   traMADol 50 MG tablet Commonly known as: ULTRAM Take 50 mg by mouth every 6 (six) hours as needed for pain.         Follow-up Information     Alwyn Pea, MD. Go in 1 week(s).   Specialties: Cardiology, Internal Medicine Contact information: [address] [place] [address] [phone]         Dorthula Nettles, DO. Go in 9 day(s).   Specialty: Cardiology Why: Hospital Follow-Up 01/09/23 @ 11:15 Please bring all medications with you to appointment [facility] [address] Second Floor Free Valet Parking @ door Contact information: [address] [place]  [address] [phone]                 No Known Allergies   The results of significant diagnostics from this hospitalization (including imaging, microbiology, ancillary and laboratory) are listed below for reference.   Consultations:   Procedures/Studies: MR FEMUR LEFT W WO CONTRAST  Result Date: 12/28/2022 CLINICAL DATA:  Bone lesion, femur, incidental. Palpable lump in the mid to distal left thigh for 2-3 months. History of multiple myeloma. EXAM: MR OF THE LEFT LOWER EXTREMITY WITHOUT AND WITH CONTRAST TECHNIQUE: Multiplanar, multisequence MR imaging of the left thigh was performed both before and after administration of intravenous contrast. CONTRAST:  10mL GADAVIST GADOBUTROL 1 MMOL/ML IV SOLN COMPARISON:  Lower extremity venous Doppler ultrasound 04/14/2022. Bone survey 12/27/2022. FINDINGS: Bones/Joint/Cartilage Both thighs are included on the coronal images. There is a [name]-based exostosis involving the anterior aspect of  the distal left femur, corresponding with the radiographic abnormality. This exostosis measures approximately 2.6 x 1.8 x 6.3 cm and is consistent with an incidental osteochondroma. No associated marrow enhancement, cartilaginous cap thickening or pseudo bursal formation. The left femur otherwise appears unremarkable. There are no suspicious osseous lesions. No evidence of acute fracture, dislocation or femoral head osteonecrosis. No significant arthropathy or effusions identified at either knee or hip. Ligaments No gross ligamentous abnormalities on large field-of-view imaging. Muscles and Tendons Mild left common hamstring tendinosis without tear. No focal muscular abnormalities are identified within the left thigh. Mild asymmetric T2 hyperintensity demonstrated within the right thigh musculature, greatest within the vastus lateralis. Soft tissues At least moderate diffuse subcutaneous edema is noted within both thighs. This appears fairly symmetric on the coronal  images. No focal fluid collection or suspicious soft tissue enhancement identified. The left testis appears retracted into the left inguinal canal. This appears changed from pelvic CT 03/06/2019 and therefore not consistent with an incompletely descended testicle. Correlate clinically. IMPRESSION: 1. The palpable abnormality in the distal left thigh corresponds with an incidental osteochondroma. No suspicious osseous lesions identified. 2. At least moderate diffuse subcutaneous edema within both thighs, nonspecific. No focal fluid collection or suspicious soft tissue enhancement identified. 3. Mild asymmetric T2 hyperintensity within the right thigh musculature, greatest within the vastus lateralis. This is nonspecific and could be secondary to subacute denervation, venous stasis or myositis. 4. The left testis appears retracted into the left inguinal canal. Correlate clinically. Electronically Signed   By: Carey Bullocks M.D.   On: 12/28/2022 08:43   DG Bone Survey Met  Result Date: 12/27/2022 CLINICAL DATA:  Multiple myeloma, posterior neck pain around C7, lump on left distal femur x2 months EXAM: METASTATIC BONE SURVEY COMPARISON:  None Available. FINDINGS: No lytic lesion in the calvarium. Bilateral upper extremities are within normal limits. Mild degenerative changes of the lower cervical spine, without lytic lesion. Thoracic spine is within normal limits. Mild degenerative changes of the upper lumbar spine, without lytic lesion. Lungs are clear.  The heart is normal in size. No lytic lesion in the bony pelvis. Right lower extremity is within normal limits. In the left lower extremity, there is an 8.5 cm mixed/sclerotic lesion in the lateral distal femoral shaft with overlying cortical thickening/periosteal reaction, with a benign appearance. This appearance favors a nonaggressive lesions such as nonossifying fibroma or less likely enchondroma. Multiple myeloma is considered unlikely. IMPRESSION: 8.5 cm  mixed/sclerotic lesion in the lateral distal left femoral shaft, favoring a nonaggressive lesion such as nonossifying fibroma or less likely enchondroma. Multiple myeloma is considered unlikely. Otherwise negative multiple myeloma. Follow-up orthopedic consultation is suggested with dedicated femur radiographs prior to possible MR. Electronically Signed   By: Charline Bills M.D.   On: 12/27/2022 17:20   ECHOCARDIOGRAM COMPLETE  Result Date: 12/27/2022    ECHOCARDIOGRAM REPORT   Patient Name:   CADARRIUS ANTIGUA Date of Exam: 12/27/2022 Medical Rec #:  [id number]        Height:       71.0 in Accession #:    [id number]       Weight:       281.0 lb Date of Birth:  08/11/1970        BSA:          2.436 m Patient Age:    52 years         BP:           98/73 mmHg Patient  Gender: M                HR:           81 bpm. Exam Location:  [facility] Procedure: 2D Echo, 3D Echo, Cardiac Doppler, Color Doppler and Strain Analysis Indications:     CHF  History:         Patient has prior history of Echocardiogram examinations. CHF                  and Cardiomyopathy, CAD and Previous Myocardial Infarction,                  Signs/Symptoms:Dyspnea; Risk Factors:Hypertension, Diabetes,                  Dyslipidemia and Sleep Apnea.  Sonographer:     Mikki Harbor Referring Phys:  [id number] Andris Baumann Diagnosing Phys: Clotilde Dieter  Sonographer Comments: Patient is obese. Global longitudinal strain was attempted. IMPRESSIONS  1. Left ventricular ejection fraction, by estimation, is 35 to 40%. The left ventricle has moderately decreased function. The left ventricle demonstrates global hypokinesis. Left ventricular diastolic parameters are consistent with Grade I diastolic dysfunction (impaired relaxation).  2. Right ventricular systolic function is low normal. The right ventricular size is moderately enlarged. There is normal pulmonary artery systolic pressure. The estimated right ventricular systolic pressure is 29.2 mmHg.  3.  Left atrial size was moderately dilated.  4. Right atrial size was mildly dilated.  5. The mitral valve is degenerative. Moderate mitral valve regurgitation.  6. Tricuspid valve regurgitation is moderate.  7. The aortic valve is normal in structure. Aortic valve regurgitation is not visualized. No aortic stenosis is present. FINDINGS  Left Ventricle: Left ventricular ejection fraction, by estimation, is 35 to 40%. The left ventricle has moderately decreased function. The left ventricle demonstrates global hypokinesis. The left ventricular internal cavity size was normal in size. There is no left ventricular hypertrophy. Left ventricular diastolic parameters are consistent with Grade I diastolic dysfunction (impaired relaxation). Right Ventricle: The right ventricular size is moderately enlarged. No increase in right ventricular wall thickness. Right ventricular systolic function is low normal. There is normal pulmonary artery systolic pressure. The tricuspid regurgitant velocity  is 2.30 m/s, and with an assumed right atrial pressure of 8 mmHg, the estimated right ventricular systolic pressure is 29.2 mmHg. Left Atrium: Left atrial size was moderately dilated. Right Atrium: Right atrial size was mildly dilated. Pericardium: There is no evidence of pericardial effusion. Mitral Valve: The mitral valve is degenerative in appearance. Moderate mitral valve regurgitation. MV peak gradient, 8.0 mmHg. The mean mitral valve gradient is 3.0 mmHg. Tricuspid Valve: The tricuspid valve is grossly normal. Tricuspid valve regurgitation is moderate. Aortic Valve: The aortic valve is normal in structure. Aortic valve regurgitation is not visualized. No aortic stenosis is present. Aortic valve mean gradient measures 2.0 mmHg. Aortic valve peak gradient measures 3.1 mmHg. Aortic valve area, by VTI measures 2.15 cm. Pulmonic Valve: The pulmonic valve was grossly normal. Pulmonic valve regurgitation is not visualized. Aorta: The aortic  root is normal in size and structure. IAS/Shunts: No atrial level shunt detected by color flow Doppler.  LEFT VENTRICLE PLAX 2D LVIDd:         5.30 cm LVIDs:         4.10 cm LV PW:         1.40 cm LV IVS:        1.40 cm LVOT diam:  2.20 cm LV SV:         36 LV SV Index:   15 LVOT Area:     3.80 cm  LV Volumes (MOD) LV vol d, MOD A2C: 98.6 ml LV vol d, MOD A4C: 117.0 ml LV vol s, MOD A2C: 71.0 ml LV vol s, MOD A4C: 73.1 ml LV SV MOD A2C:     27.6 ml LV SV MOD A4C:     117.0 ml LV SV MOD BP:      34.9 ml RIGHT VENTRICLE RV Basal diam:  4.05 cm RV Mid diam:    3.80 cm RV S prime:     9.34 cm/s LEFT ATRIUM             Index        RIGHT ATRIUM           Index LA diam:        5.20 cm 2.13 cm/m   RA Area:     22.10 cm LA Vol (A2C):   83.8 ml 34.40 ml/m  RA Volume:   67.50 ml  27.70 ml/m LA Vol (A4C):   93.6 ml 38.42 ml/m LA Biplane Vol: 95.8 ml 39.32 ml/m  AORTIC VALVE                    PULMONIC VALVE AV Area (Vmax):    2.90 cm     PV Vmax:       0.66 m/s AV Area (Vmean):   2.23 cm     PV Peak grad:  1.7 mmHg AV Area (VTI):     2.15 cm AV Vmax:           87.70 cm/s AV Vmean:          63.300 cm/s AV VTI:            0.168 m AV Peak Grad:      3.1 mmHg AV Mean Grad:      2.0 mmHg LVOT Vmax:         67.00 cm/s LVOT Vmean:        37.100 cm/s LVOT VTI:          0.095 m LVOT/AV VTI ratio: 0.56  AORTA Ao Root diam: 3.80 cm MITRAL VALVE                TRICUSPID VALVE MV Area (PHT): 3.99 cm     TR Peak grad:   21.2 mmHg MV Area VTI:   1.17 cm     TR Vmax:        230.00 cm/s MV Peak grad:  8.0 mmHg MV Mean grad:  3.0 mmHg     SHUNTS MV Vmax:       1.41 m/s     Systemic VTI:  0.09 m MV Vmean:      73.5 cm/s    Systemic Diam: 2.20 cm MV Decel Time: 190 msec MV E velocity: 136.00 cm/s Rozell Searing Custovic Electronically signed by Clotilde Dieter Signature Date/Time: 12/27/2022/1:22:46 PM    Final    DG Chest 2 View  Result Date: 12/26/2022 CLINICAL DATA:  Chest pain. EXAM: CHEST - 2 VIEW COMPARISON:  05/30/2022. FINDINGS:  Low lung volume. There are diffuse increased interstitial markings when compared to the prior exam. Bilateral lung fields are otherwise clear. No acute consolidation or lung collapse. Bilateral costophrenic angles are clear. Stable cardio-mediastinal silhouette. No acute osseous abnormalities. The soft tissues are within normal limits. IMPRESSION: *In appropriate clinical setting,  findings favor mild congestive heart failure/pulmonary edema. Electronically Signed   By: Jules Schick M.D.   On: 12/26/2022 18:28   MR CARDIAC MORPHOLOGY W WO CONTRAST  Result Date: 12/25/2022 CLINICAL DATA:  Cardiomyopathy, hx of CAD/PCI. Eval for infiltrative process. EXAM: CARDIAC MRI TECHNIQUE: The patient was scanned on a 1.5 Tesla Siemens magnet. A dedicated cardiac coil was used. Functional imaging was done using Fiesta sequences. 2,3, and 4 chamber views were done to assess for RWMA's. Modified Simpson's rule using a short axis stack was used to calculate an ejection fraction on a dedicated work [employer]. The patient received 15 cc of Gadavist. After 10 minutes inversion recovery sequences were used to assess for infiltration and scar tissue. Velocity flow mapping performed in the ascending aorta and main pulmonary artery. CONTRAST:  15 cc  of Gadavist FINDINGS: 1. Mildly dilated left ventricular size, mild LV thickness, moderately reduced LV systolic function (LVEF = 37%). There is global hypokinesis. There is diffuse midwall late gadolinium enhancement in the left ventricular myocardium. LVEDV: 235 ml LVESV: 149 ml SV: 86 ml CO: 5L/min Myocardial mass: 207g LV native T1 value 1104 ms (normal <1000 ms) LV ECV value 62%.  Normal <30%. 2. Normal right ventricular size, thickness, moderately reduced systolic function (RVEF = 30%). There is global hypokinesis. 3.  Mildly dilated left atrial size. normal right atrial size. 4. Normal size of the aortic root, ascending aorta and pulmonary artery. 5. Mild  mitral regurgitation, trivial pulmonary regurgitation. No significant valvular abnormalities. 6.  Normal pericardium.  trace pericardial effusion. IMPRESSION: 1. Mildly dilated LV, moderately reduced LV systolic function. LVEF 37%. 2.  Diffuse midwall LGE/scar 3. Elevated native T1 and ECV values suggesting infiltrative disease. 4.  Moderately reduced RV function. 5.  Trivial pericardial effusion 6. Findings suggest infiltrative cardiomyopathy. Recommend complete workup for amyloidosis Electronically Signed   By: Debbe Odea M.D.   On: 12/25/2022 17:50   MR CARDIAC VELOCITY FLOW MAP  Result Date: 12/25/2022 CLINICAL DATA:  Cardiomyopathy, hx of CAD/PCI. Eval for infiltrative process. EXAM: CARDIAC MRI TECHNIQUE: The patient was scanned on a 1.5 Tesla Siemens magnet. A dedicated cardiac coil was used. Functional imaging was done using Fiesta sequences. 2,3, and 4 chamber views were done to assess for RWMA's. Modified Simpson's rule using a short axis stack was used to calculate an ejection fraction on a dedicated work [employer]. The patient received 15 cc of Gadavist. After 10 minutes inversion recovery sequences were used to assess for infiltration and scar tissue. Velocity flow mapping performed in the ascending aorta and main pulmonary artery. CONTRAST:  15 cc  of Gadavist FINDINGS: 1. Mildly dilated left ventricular size, mild LV thickness, moderately reduced LV systolic function (LVEF = 37%). There is global hypokinesis. There is diffuse midwall late gadolinium enhancement in the left ventricular myocardium. LVEDV: 235 ml LVESV: 149 ml SV: 86 ml CO: 5L/min Myocardial mass: 207g LV native T1 value 1104 ms (normal <1000 ms) LV ECV value 62%.  Normal <30%. 2. Normal right ventricular size, thickness, moderately reduced systolic function (RVEF = 30%). There is global hypokinesis. 3.  Mildly dilated left atrial size. normal right atrial size. 4. Normal size of the aortic root, ascending  aorta and pulmonary artery. 5. Mild mitral regurgitation, trivial pulmonary regurgitation. No significant valvular abnormalities. 6.  Normal pericardium.  trace pericardial effusion. IMPRESSION: 1. Mildly dilated LV, moderately reduced LV systolic function. LVEF 37%. 2.  Diffuse midwall LGE/scar 3. Elevated native  T1 and ECV values suggesting infiltrative disease. 4.  Moderately reduced RV function. 5.  Trivial pericardial effusion 6. Findings suggest infiltrative cardiomyopathy. Recommend complete workup for amyloidosis Electronically Signed   By: Debbe Odea M.D.   On: 12/25/2022 17:50   MR CARDIAC VELOCITY FLOW MAP  Result Date: 12/25/2022 CLINICAL DATA:  Cardiomyopathy, hx of CAD/PCI. Eval for infiltrative process. EXAM: CARDIAC MRI TECHNIQUE: The patient was scanned on a 1.5 Tesla Siemens magnet. A dedicated cardiac coil was used. Functional imaging was done using Fiesta sequences. 2,3, and 4 chamber views were done to assess for RWMA's. Modified Simpson's rule using a short axis stack was used to calculate an ejection fraction on a dedicated work [employer]. The patient received 15 cc of Gadavist. After 10 minutes inversion recovery sequences were used to assess for infiltration and scar tissue. Velocity flow mapping performed in the ascending aorta and main pulmonary artery. CONTRAST:  15 cc  of Gadavist FINDINGS: 1. Mildly dilated left ventricular size, mild LV thickness, moderately reduced LV systolic function (LVEF = 37%). There is global hypokinesis. There is diffuse midwall late gadolinium enhancement in the left ventricular myocardium. LVEDV: 235 ml LVESV: 149 ml SV: 86 ml CO: 5L/min Myocardial mass: 207g LV native T1 value 1104 ms (normal <1000 ms) LV ECV value 62%.  Normal <30%. 2. Normal right ventricular size, thickness, moderately reduced systolic function (RVEF = 30%). There is global hypokinesis. 3.  Mildly dilated left atrial size. normal right atrial size. 4.  Normal size of the aortic root, ascending aorta and pulmonary artery. 5. Mild mitral regurgitation, trivial pulmonary regurgitation. No significant valvular abnormalities. 6.  Normal pericardium.  trace pericardial effusion. IMPRESSION: 1. Mildly dilated LV, moderately reduced LV systolic function. LVEF 37%. 2.  Diffuse midwall LGE/scar 3. Elevated native T1 and ECV values suggesting infiltrative disease. 4.  Moderately reduced RV function. 5.  Trivial pericardial effusion 6. Findings suggest infiltrative cardiomyopathy. Recommend complete workup for amyloidosis Electronically Signed   By: Debbe Odea M.D.   On: 12/25/2022 17:50      Labs: BNP (last 3 results) Recent Labs    10/14/22 1326 10/17/22 0951 12/26/22 1619  BNP 534.9* 483.5* 827.9*   Basic Metabolic Panel: Recent Labs  Lab 12/26/22 1612 12/28/22 0626 12/29/22 0418 12/30/22 0650 12/31/22 0252  NA 134* 134* 132* 134* 136  K 3.7 3.2* 3.1* 3.5 4.5  CL 96* 94* 91* 90* 90*  CO2 28 29 28 31  35*  GLUCOSE 193* 153* 140* 143* 142*  BUN 33* 37* 37* 36* 36*  CREATININE 1.18 1.33* 1.21 1.32* 1.23  CALCIUM 7.9* 8.0* 8.2* 8.2* 8.2*  MG  --  2.0 1.9 1.9 2.0   Liver Function Tests: No results for input(s): "AST", "ALT", "ALKPHOS", "BILITOT", "PROT", "ALBUMIN" in the last 168 hours. No results for input(s): "LIPASE", "AMYLASE" in the last 168 hours. No results for input(s): "AMMONIA" in the last 168 hours. CBC: Recent Labs  Lab 12/26/22 1612 12/28/22 0626 12/29/22 0418 12/30/22 0650 12/31/22 0252  WBC 9.8 9.7 9.6 9.2 9.3  HGB 15.5 14.3 15.6 15.0 15.3  HCT 47.2 42.9 46.7 44.5 45.9  MCV 83.8 82.5 80.7 81.2 81.7  PLT 428* 441* 468* 441* 463*   Cardiac Enzymes: No results for input(s): "CKTOTAL", "CKMB", "CKMBINDEX", "TROPONINI" in the last 168 hours. BNP: Invalid input(s): "POCBNP" CBG: Recent Labs  Lab 12/26/22 2210 12/27/22 0758 12/27/22 1124 12/27/22 1139 12/27/22 1648  GLUCAP 135* 118* 182* 137* 146*    D-Dimer  No results for input(s): "DDIMER" in the last 72 hours. Hgb A1c No results for input(s): "HGBA1C" in the last 72 hours. Lipid Profile No results for input(s): "CHOL", "HDL", "LDLCALC", "TRIG", "CHOLHDL", "LDLDIRECT" in the last 72 hours. Thyroid function studies No results for input(s): "TSH", "T4TOTAL", "T3FREE", "THYROIDAB" in the last 72 hours.  Invalid input(s): "FREET3" Anemia work up No results for input(s): "VITAMINB12", "FOLATE", "FERRITIN", "TIBC", "IRON", "RETICCTPCT" in the last 72 hours. Urinalysis    Component Value Date/Time   COLORURINE YELLOW 09/26/2020 0929   APPEARANCEUR CLEAR 09/26/2020 0929   LABSPEC 1.010 09/26/2020 0929   PHURINE 5.0 09/26/2020 0929   GLUCOSEU 500 (A) 09/26/2020 0929   HGBUR TRACE (A) 09/26/2020 0929   BILIRUBINUR NEGATIVE 09/26/2020 0929   KETONESUR 40 (A) 09/26/2020 0929   PROTEINUR 100 (A) 09/26/2020 0929   NITRITE NEGATIVE 09/26/2020 0929   LEUKOCYTESUR NEGATIVE 09/26/2020 0929   Sepsis Labs Recent Labs  Lab 12/28/22 0626 12/29/22 0418 12/30/22 0650 12/31/22 0252  WBC 9.7 9.6 9.2 9.3   Microbiology No results found for this or any previous visit (from the past 240 hour(s)).   Total time spend on discharging this patient, including the last patient exam, discussing the hospital stay, instructions for ongoing care as it relates to all pertinent caregivers, as well as preparing the medical discharge records, prescriptions, and/or referrals as applicable, is 35 minutes.    Darlin Priestly, MD  [facility] 12/31/2022, 9:52 AM

## 2022-12-31 NOTE — Plan of Care (Signed)
  Problem: Coping: Goal: Ability to adjust to condition or change in health will improve Outcome: Progressing   Problem: Fluid Volume: Goal: Ability to maintain a balanced intake and output will improve Outcome: Progressing   Problem: Health Behavior/Discharge Planning: Goal: Ability to identify and utilize available resources and services will improve Outcome: Progressing Goal: Ability to manage health-related needs will improve Outcome: Progressing   

## 2022-12-31 NOTE — TOC Transition Note (Signed)
Transition of Care [facility]) - CM/SW Discharge Note   Patient Details  Name: Jesse [name] MRN: [id number] Date of Birth: 09-18-1970  Transition of Care [facility]) CM/SW Contact:  Margarito Liner, LCSW Phone Number: 12/31/2022, 10:38 AM   Clinical Narrative: Patient has orders to discharge home today. No further concerns. CSW signing off.    Final next level of care: Home/Self Care Barriers to Discharge: Barriers Resolved   Patient Goals and CMS Choice      Discharge Placement                      Patient and family notified of of transfer: 12/31/22  Discharge Plan and Services Additional resources added to the After Visit Summary for                                       Social Determinants of Health (SDOH) Interventions SDOH Screenings   Food Insecurity: No Food Insecurity (12/27/2022)  Housing: Low Risk  (12/27/2022)  Transportation Needs: No Transportation Needs (12/27/2022)  Utilities: Not At Risk (12/27/2022)  Tobacco Use: High Risk (12/26/2022)     Readmission Risk Interventions     No data to display

## 2022-12-31 NOTE — Progress Notes (Addendum)
Advanced Heart Failure Rounding Note   Subjective:    Diuretics switched to PO yesterday. -3,254mL UOP. Overall weight down 24lbs.   SCr 1.33>1.21>1.32>1.23  Feels good, ready to go home   Objective:   Weight Range:  Vital Signs:   Temp:  [97.3 F (36.3 C)-98.4 F (36.9 C)] 98.1 F (36.7 C) (12/10 0033) Pulse Rate:  [72-83] 72 (12/10 0033) Resp:  [16-18] 18 (12/10 0033) BP: (97-130)/(62-84) 110/62 (12/10 0033) SpO2:  [98 %-100 %] 98 % (12/10 0033) Weight:  [829 kg] 117 kg (12/10 0408) Last BM Date : 12/29/22  Weight change: Filed Weights   12/30/22 0600 12/30/22 0627 12/31/22 0408  Weight: 118.7 kg 117.1 kg 117 kg    Intake/Output:   Intake/Output Summary (Last 24 hours) at 12/31/2022 0812 Last data filed at 12/31/2022 0407 Gross per 24 hour  Intake 760 ml  Output 2750 ml  Net -1990 ml    Physical Exam: General:  well appearing.  No respiratory difficulty HEENT: normal Neck: supple. JVD ~6 cm. Carotids 2+ bilat; no bruits. No lymphadenopathy or thyromegaly appreciated. Cor: PMI nondisplaced. Regular rate & rhythm. No rubs, gallops or murmurs. Lungs: clear Abdomen: soft, nontender, nondistended. No hepatosplenomegaly. No bruits or masses. Good bowel sounds. Extremities: no cyanosis, clubbing, rash, +1 BLE edema. + Ted hose Neuro: alert & oriented x 3, cranial nerves grossly intact. moves all 4 extremities w/o difficulty. Affect pleasant.   Telemetry: NSR 80s-90s 9 and 3 second NSVT runs yesterday (Personally reviewed)    Labs: Basic Metabolic Panel: Recent Labs  Lab 12/26/22 1612 12/28/22 0626 12/29/22 0418 12/30/22 0650 12/31/22 0252  NA 134* 134* 132* 134* 136  K 3.7 3.2* 3.1* 3.5 4.5  CL 96* 94* 91* 90* 90*  CO2 28 29 28 31  35*  GLUCOSE 193* 153* 140* 143* 142*  BUN 33* 37* 37* 36* 36*  CREATININE 1.18 1.33* 1.21 1.32* 1.23  CALCIUM 7.9* 8.0* 8.2* 8.2* 8.2*  MG  --  2.0 1.9 1.9 2.0    Liver Function Tests: No results for input(s):  "AST", "ALT", "ALKPHOS", "BILITOT", "PROT", "ALBUMIN" in the last 168 hours. No results for input(s): "LIPASE", "AMYLASE" in the last 168 hours. No results for input(s): "AMMONIA" in the last 168 hours.  CBC: Recent Labs  Lab 12/26/22 1612 12/28/22 0626 12/29/22 0418 12/30/22 0650 12/31/22 0252  WBC 9.8 9.7 9.6 9.2 9.3  HGB 15.5 14.3 15.6 15.0 15.3  HCT 47.2 42.9 46.7 44.5 45.9  MCV 83.8 82.5 80.7 81.2 81.7  PLT 428* 441* 468* 441* 463*    Cardiac Enzymes: No results for input(s): "CKTOTAL", "CKMB", "CKMBINDEX", "TROPONINI" in the last 168 hours.  BNP: BNP (last 3 results) Recent Labs    10/14/22 1326 10/17/22 0951 12/26/22 1619  BNP 534.9* 483.5* 827.9*   ProBNP (last 3 results) Recent Labs    12/17/22 1159  PROBNP 6,461*   Other results:  Imaging: No results found.  Medications:   Scheduled Medications:  aspirin EC  81 mg Oral Daily   clopidogrel  75 mg Oral Daily   empagliflozin  10 mg Oral Daily   enoxaparin (LOVENOX) injection  0.5 mg/kg Subcutaneous Q24H   metoprolol succinate  12.5 mg Oral Daily   midodrine  10 mg Oral TID WC   torsemide  40 mg Oral BID    Infusions:    PRN Medications: acetaminophen **OR** acetaminophen, albuterol, ondansetron **OR** ondansetron (ZOFRAN) IV, traMADol  Assessment/Plan:  1: Acute on chronic systolic HF with R>L  symptoms - s/p Inferior STEMI in 6/18. Cath with LAD 20% LCx 50%d. OM4 75% RCA 100%m  -> PCI with 2 overlapping DES stents - Myoview 07/22: EF45% inferior scar. No ischemia  - Echo 08/15/20 EF 45-50% - Echo 04/17/22 EF 35-40% moderate LVH RV low normal - cMRI 12/25/22: LVEF 37%. Diffuse midwall LGE/scar Elevated native T1 and ECV values suggesting infiltrative disease. ECV 62%Moderately reduced RV function. - NYHA class IV with R> L HF symptoms - markedly fluid overloaded with poor response to IV lasix  - cMRI suggestive of amyloid with ECV 62%. Given age and rapid course, I am concerned for AL  amyloidosis.  - Echo 12/27/22 EF 35-40%, LV with GHK, GIDD, RV low normal, LA mod dilated, RA mildly dilated, mod MR/TR - Diuresed well with IV lasix. Lasix gtt stopped yesterday. Doing well on  Torsemide 40 mg BID - Decrease midodrine 10>5 mg TID.  - Continue SGLT2i 10 mg daily - Hold off on spiro with titration of midodrine and addition of metoprolol. Can add back outpatient.  - Check SPEP/UPEP and serum free light chains (in process). If negative will need PYP - Skeletal survey with no evidence of disease   2. OSA- - CPAP titration has been completed and he's now waiting on equipment to be delivered   3. DM2 - A1c 7.3% in 3/24 (primary care)   4: CAD  - Follows with Dr. Juliann Pares - continue clopidogrel 75mg  daily - Continue crestor 01/25 - no current s/s angina - LDL 11/19/22 was 172 - Cath 06/2016 with LAD 20% LCx 50%d. OM4 75% RCA 100%m  -> PCI with 2 overlapping DES stents   5. Tobacco use - smoking 5-6 cigars per day - encouraged cessation  Suspect he is ready for discharge today pending MD eval. Has f/u scheduled in AHF clinic to follow for possible cardiac amyloid.    Length of Stay: 5  Alen Bleacher 12/31/2022, 8:12 AM  Advanced Heart Failure Team Pager [phone] (M-F; 7a - 4p)  Please contact [facility] for night-coverage after hours (4p -7a ) and weekends on [email or web address]

## 2022-12-31 NOTE — Progress Notes (Addendum)
[facility] CLINIC CARDIOLOGY PROGRESS NOTE       Patient ID: Jesse [name] MRN: [id number] DOB/AGE: Oct 20, 1970 52 y.o.  Admit date: 12/26/2022 Referring Physician Dr. Lindajo Royal Primary Physician Leanna Sato, MD  Primary Cardiologist Dr. Dorothyann Peng Reason for Consultation AoCHF, amyloid workup  HPI: Jesse [name] is a 51 y.o. male  with a past medical history of CAD (MI 2004/2007, inf STEMI 2018 s/p overlapping DES to RCA), DM2, hyperlipidemia, HTN, OSA, tobacco use and chronic heart failure who presented to the ED on 12/26/2022 for fluid overload. Cardiology was consulted for further evaluation.   Interval history: -Patient seen and examined this AM, feeling well today.  -Net negative 14.9L. Weight down 10 kg since admission.  -SOB improved, LE edema significantly improved.  -BP remains borderline, he is without dizziness/lightheadedness.   Review of systems complete and found to be negative unless listed above    Past Medical History:  Diagnosis Date   CHF (congestive heart failure) (HCC)    Coronary artery disease    Inferior myocardial infarction in 2004.  He was treated with thrombolytics at [facility].  He then had an inferior MI in March 2007.  He received a Horizon study stent 3.5 x 20 mm in the distal RCA at [facility].  No other obstructive disease.   Diabetes mellitus    TYPE II   Erectile dysfunction    Hyperlipidemia    Hypertension    MI (myocardial infarction) (HCC) 4098,1191   Morbid obesity (HCC)    Sleep apnea     Past Surgical History:  Procedure Laterality Date   CARDIAC CATHETERIZATION     @ [facility]   CHOLECYSTECTOMY     LEFT HEART CATH AND CORONARY ANGIOGRAPHY N/A 07/05/2016   Procedure: Left Heart Cath and Coronary Angiography;  Surgeon: Alwyn Pea, MD;  Location: [facility] INVASIVE CV LAB;  Service: Cardiovascular;  Laterality: N/A;   SEPTOPLASTY      Medications Prior to Admission  Medication Sig Dispense Refill Last Dose   metoprolol  succinate (TOPROL-XL) 25 MG 24 hr tablet Take 0.5 tablets (12.5 mg total) by mouth daily. Take with or immediately following a meal. 45 tablet 2 12/24/2022 at 1200   nitroGLYCERIN (NITROSTAT) 0.4 MG SL tablet Place 1 tablet (0.4 mg total) under the tongue every 5 (five) minutes as needed for chest pain. 30 tablet 0 prn   potassium chloride SA (KLOR-CON M) 20 MEQ tablet Take 1 tablet (20 mEq total) by mouth 3 (three) times a week.   Past Week   traMADol (ULTRAM) 50 MG tablet Take 50 mg by mouth every 6 (six) hours as needed for pain.   prn   [DISCONTINUED] clopidogrel (PLAVIX) 75 MG tablet Take 75 mg by mouth daily.   12/24/2022 at 1200   [DISCONTINUED] empagliflozin (JARDIANCE) 25 MG TABS tablet Take 25 mg by mouth daily.   12/24/2022   [DISCONTINUED] torsemide (DEMADEX) 20 MG tablet Take 2 tablets (40 mg total) by mouth 2 (two) times daily.   12/24/2022 at 1200   AMBULATORY NON FORMULARY MEDICATION Trimix (30/1/10)-(Pap/Phent/PGE)  Test Dose  1ml vial   Qty #3 Refills 0  [facility] [phone] Fax [phone] (Patient not taking: Reported on 12/26/2022) 3 mL 0 Not Taking   Dulaglutide (TRULICITY) 1.5 MG/0.5ML SOPN Inject into the skin. (Patient not taking: Reported on 12/26/2022)      furosemide (LASIX) 40 MG tablet Take 40 mg by mouth 2 (two) times daily. (Patient not taking: Reported  on 12/27/2022)   Not Taking   lisinopril (ZESTRIL) 10 MG tablet Take 1 tablet by mouth daily. (Patient not taking: Reported on 12/27/2022)   Not Taking   rosuvastatin (CRESTOR) 20 MG tablet Take 20 mg by mouth daily. (Patient not taking: Reported on 12/27/2022)   Not Taking   [DISCONTINUED] spironolactone (ALDACTONE) 25 MG tablet Take 1 tablet (25 mg total) by mouth daily. (Patient not taking: Reported on 12/26/2022) 30 tablet 5 Not Taking   Social History   Socioeconomic History   Marital status: Married    Spouse name: Not on file   Number of children: Not on file   Years of education: Not on file    Highest education level: Not on file  Occupational History   Not on file  Tobacco Use   Smoking status: Every Day    Current packs/day: 0.00    Average packs/day: 1 pack/day for 15.0 years (15.0 ttl pk-yrs)    Types: Cigars, Cigarettes    Start date: 2005    Last attempt to quit: 2020    Years since quitting: 4.9   Smokeless tobacco: Never  Vaping Use   Vaping status: Never Used  Substance and Sexual Activity   Alcohol use: No   Drug use: No    Comment: prior hx in his 20's of cocaine and marijuana   Sexual activity: Not on file  Other Topics Concern   Not on file  Social History Narrative   Not on file   Social Determinants of Health   Financial Resource Strain: Not on file  Food Insecurity: No Food Insecurity (12/27/2022)   Hunger Vital Sign    Worried About Running Out of Food in the Last Year: Never true    Ran Out of Food in the Last Year: Never true  Transportation Needs: No Transportation Needs (12/27/2022)   PRAPARE - Administrator, Civil Service (Medical): No    Lack of Transportation (Non-Medical): No  Physical Activity: Not on file  Stress: Not on file  Social Connections: Not on file  Intimate Partner Violence: Not At Risk (12/27/2022)   Humiliation, Afraid, Rape, and Kick questionnaire    Fear of Current or Ex-Partner: No    Emotionally Abused: No    Physically Abused: No    Sexually Abused: No    Family History  Adopted: Yes     Vitals:   12/30/22 2006 12/31/22 0033 12/31/22 0408 12/31/22 1003  BP: 104/84 110/62  102/81  Pulse: 78 72  87  Resp: 18 18  16   Temp: 98.3 F (36.8 C) 98.1 F (36.7 C)  97.9 F (36.6 C)  TempSrc:  Oral    SpO2: 98% 98%  100%  Weight:   117 kg   Height:        PHYSICAL EXAM General: Well appearing male, well nourished, in no acute distress. HEENT: Normocephalic and atraumatic. Neck: No JVD.  Lungs: Normal respiratory effort on room air. Clear bilaterally to auscultation. No wheezes, crackles, rhonchi.   Heart: HRRR. Normal S1 and S2 without gallops or murmurs.  Abdomen: Non-distended. Msk: Normal strength and tone for age. Extremities: Warm and well perfused. No clubbing, cyanosis. 1+ pitting edema bilaterally, significantly improved. Ted hose in place. Neuro: Alert and oriented X 3. Psych: Answers questions appropriately.   Labs: Basic Metabolic Panel: Recent Labs    12/30/22 0650 12/31/22 0252  NA 134* 136  K 3.5 4.5  CL 90* 90*  CO2 31 35*  GLUCOSE  143* 142*  BUN 36* 36*  CREATININE 1.32* 1.23  CALCIUM 8.2* 8.2*  MG 1.9 2.0   Liver Function Tests: No results for input(s): "AST", "ALT", "ALKPHOS", "BILITOT", "PROT", "ALBUMIN" in the last 72 hours. No results for input(s): "LIPASE", "AMYLASE" in the last 72 hours. CBC: Recent Labs    12/30/22 0650 12/31/22 0252  WBC 9.2 9.3  HGB 15.0 15.3  HCT 44.5 45.9  MCV 81.2 81.7  PLT 441* 463*   Cardiac Enzymes: No results for input(s): "CKTOTAL", "CKMB", "CKMBINDEX", "TROPONINIHS" in the last 72 hours.  BNP: No results for input(s): "BNP" in the last 72 hours.  D-Dimer: No results for input(s): "DDIMER" in the last 72 hours. Hemoglobin A1C: No results for input(s): "HGBA1C" in the last 72 hours.  Fasting Lipid Panel: No results for input(s): "CHOL", "HDL", "LDLCALC", "TRIG", "CHOLHDL", "LDLDIRECT" in the last 72 hours. Thyroid Function Tests: No results for input(s): "TSH", "T4TOTAL", "T3FREE", "THYROIDAB" in the last 72 hours.  Invalid input(s): "FREET3" Anemia Panel: No results for input(s): "VITAMINB12", "FOLATE", "FERRITIN", "TIBC", "IRON", "RETICCTPCT" in the last 72 hours.   Radiology: MR FEMUR LEFT W WO CONTRAST  Result Date: 12/28/2022 CLINICAL DATA:  Bone lesion, femur, incidental. Palpable lump in the mid to distal left thigh for 2-3 months. History of multiple myeloma. EXAM: MR OF THE LEFT LOWER EXTREMITY WITHOUT AND WITH CONTRAST TECHNIQUE: Multiplanar, multisequence MR imaging of the left thigh was  performed both before and after administration of intravenous contrast. CONTRAST:  10mL GADAVIST GADOBUTROL 1 MMOL/ML IV SOLN COMPARISON:  Lower extremity venous Doppler ultrasound 04/14/2022. Bone survey 12/27/2022. FINDINGS: Bones/Joint/Cartilage Both thighs are included on the coronal images. There is a broad-based exostosis involving the anterior aspect of the distal left femur, corresponding with the radiographic abnormality. This exostosis measures approximately 2.6 x 1.8 x 6.3 cm and is consistent with an incidental osteochondroma. No associated marrow enhancement, cartilaginous cap thickening or pseudo bursal formation. The left femur otherwise appears unremarkable. There are no suspicious osseous lesions. No evidence of acute fracture, dislocation or femoral head osteonecrosis. No significant arthropathy or effusions identified at either knee or hip. Ligaments No gross ligamentous abnormalities on large field-of-view imaging. Muscles and Tendons Mild left common hamstring tendinosis without tear. No focal muscular abnormalities are identified within the left thigh. Mild asymmetric T2 hyperintensity demonstrated within the right thigh musculature, greatest within the vastus lateralis. Soft tissues At least moderate diffuse subcutaneous edema is noted within both thighs. This appears fairly symmetric on the coronal images. No focal fluid collection or suspicious soft tissue enhancement identified. The left testis appears retracted into the left inguinal canal. This appears changed from pelvic CT 03/06/2019 and therefore not consistent with an incompletely descended testicle. Correlate clinically. IMPRESSION: 1. The palpable abnormality in the distal left thigh corresponds with an incidental osteochondroma. No suspicious osseous lesions identified. 2. At least moderate diffuse subcutaneous edema within both thighs, nonspecific. No focal fluid collection or suspicious soft tissue enhancement identified. 3. Mild  asymmetric T2 hyperintensity within the right thigh musculature, greatest within the vastus lateralis. This is nonspecific and could be secondary to subacute denervation, venous stasis or myositis. 4. The left testis appears retracted into the left inguinal canal. Correlate clinically. Electronically Signed   By: Carey Bullocks M.D.   On: 12/28/2022 08:43   DG Bone Survey Met  Result Date: 12/27/2022 CLINICAL DATA:  Multiple myeloma, posterior neck pain around C7, lump on left distal femur x2 months EXAM: METASTATIC BONE SURVEY COMPARISON:  None  Available. FINDINGS: No lytic lesion in the calvarium. Bilateral upper extremities are within normal limits. Mild degenerative changes of the lower cervical spine, without lytic lesion. Thoracic spine is within normal limits. Mild degenerative changes of the upper lumbar spine, without lytic lesion. Lungs are clear.  The heart is normal in size. No lytic lesion in the bony pelvis. Right lower extremity is within normal limits. In the left lower extremity, there is an 8.5 cm mixed/sclerotic lesion in the lateral distal femoral shaft with overlying cortical thickening/periosteal reaction, with a benign appearance. This appearance favors a nonaggressive lesions such as nonossifying fibroma or less likely enchondroma. Multiple myeloma is considered unlikely. IMPRESSION: 8.5 cm mixed/sclerotic lesion in the lateral distal left femoral shaft, favoring a nonaggressive lesion such as nonossifying fibroma or less likely enchondroma. Multiple myeloma is considered unlikely. Otherwise negative multiple myeloma. Follow-up orthopedic consultation is suggested with dedicated femur radiographs prior to possible MR. Electronically Signed   By: Charline Bills M.D.   On: 12/27/2022 17:20   ECHOCARDIOGRAM COMPLETE  Result Date: 12/27/2022    ECHOCARDIOGRAM REPORT   Patient Name:   Jesse [name] Date of Exam: 12/27/2022 Medical Rec #:  [id number]        Height:       71.0 in  Accession #:    [id number]       Weight:       281.0 lb Date of Birth:  October 22, 1970        BSA:          2.436 m Patient Age:    52 years         BP:           98/73 mmHg Patient Gender: M                HR:           81 bpm. Exam Location:  [facility] Procedure: 2D Echo, 3D Echo, Cardiac Doppler, Color Doppler and Strain Analysis Indications:     CHF  History:         Patient has prior history of Echocardiogram examinations. CHF                  and Cardiomyopathy, CAD and Previous Myocardial Infarction,                  Signs/Symptoms:Dyspnea; Risk Factors:Hypertension, Diabetes,                  Dyslipidemia and Sleep Apnea.  Sonographer:     Mikki Harbor Referring Phys:  [id number] Andris Baumann Diagnosing Phys: Clotilde Dieter  Sonographer Comments: Patient is obese. Global longitudinal strain was attempted. IMPRESSIONS  1. Left ventricular ejection fraction, by estimation, is 35 to 40%. The left ventricle has moderately decreased function. The left ventricle demonstrates global hypokinesis. Left ventricular diastolic parameters are consistent with Grade I diastolic dysfunction (impaired relaxation).  2. Right ventricular systolic function is low normal. The right ventricular size is moderately enlarged. There is normal pulmonary artery systolic pressure. The estimated right ventricular systolic pressure is 29.2 mmHg.  3. Left atrial size was moderately dilated.  4. Right atrial size was mildly dilated.  5. The mitral valve is degenerative. Moderate mitral valve regurgitation.  6. Tricuspid valve regurgitation is moderate.  7. The aortic valve is normal in structure. Aortic valve regurgitation is not visualized. No aortic stenosis is present. FINDINGS  Left Ventricle: Left ventricular ejection fraction, by estimation, is 35 to 40%.  The left ventricle has moderately decreased function. The left ventricle demonstrates global hypokinesis. The left ventricular internal cavity size was normal in size. There is no left  ventricular hypertrophy. Left ventricular diastolic parameters are consistent with Grade I diastolic dysfunction (impaired relaxation). Right Ventricle: The right ventricular size is moderately enlarged. No increase in right ventricular wall thickness. Right ventricular systolic function is low normal. There is normal pulmonary artery systolic pressure. The tricuspid regurgitant velocity  is 2.30 m/s, and with an assumed right atrial pressure of 8 mmHg, the estimated right ventricular systolic pressure is 29.2 mmHg. Left Atrium: Left atrial size was moderately dilated. Right Atrium: Right atrial size was mildly dilated. Pericardium: There is no evidence of pericardial effusion. Mitral Valve: The mitral valve is degenerative in appearance. Moderate mitral valve regurgitation. MV peak gradient, 8.0 mmHg. The mean mitral valve gradient is 3.0 mmHg. Tricuspid Valve: The tricuspid valve is grossly normal. Tricuspid valve regurgitation is moderate. Aortic Valve: The aortic valve is normal in structure. Aortic valve regurgitation is not visualized. No aortic stenosis is present. Aortic valve mean gradient measures 2.0 mmHg. Aortic valve peak gradient measures 3.1 mmHg. Aortic valve area, by VTI measures 2.15 cm. Pulmonic Valve: The pulmonic valve was grossly normal. Pulmonic valve regurgitation is not visualized. Aorta: The aortic root is normal in size and structure. IAS/Shunts: No atrial level shunt detected by color flow Doppler.  LEFT VENTRICLE PLAX 2D LVIDd:         5.30 cm LVIDs:         4.10 cm LV PW:         1.40 cm LV IVS:        1.40 cm LVOT diam:     2.20 cm LV SV:         36 LV SV Index:   15 LVOT Area:     3.80 cm  LV Volumes (MOD) LV vol d, MOD A2C: 98.6 ml LV vol d, MOD A4C: 117.0 ml LV vol s, MOD A2C: 71.0 ml LV vol s, MOD A4C: 73.1 ml LV SV MOD A2C:     27.6 ml LV SV MOD A4C:     117.0 ml LV SV MOD BP:      34.9 ml RIGHT VENTRICLE RV Basal diam:  4.05 cm RV Mid diam:    3.80 cm RV S prime:     9.34 cm/s  LEFT ATRIUM             Index        RIGHT ATRIUM           Index LA diam:        5.20 cm 2.13 cm/m   RA Area:     22.10 cm LA Vol (A2C):   83.8 ml 34.40 ml/m  RA Volume:   67.50 ml  27.70 ml/m LA Vol (A4C):   93.6 ml 38.42 ml/m LA Biplane Vol: 95.8 ml 39.32 ml/m  AORTIC VALVE                    PULMONIC VALVE AV Area (Vmax):    2.90 cm     PV Vmax:       0.66 m/s AV Area (Vmean):   2.23 cm     PV Peak grad:  1.7 mmHg AV Area (VTI):     2.15 cm AV Vmax:           87.70 cm/s AV Vmean:  63.300 cm/s AV VTI:            0.168 m AV Peak Grad:      3.1 mmHg AV Mean Grad:      2.0 mmHg LVOT Vmax:         67.00 cm/s LVOT Vmean:        37.100 cm/s LVOT VTI:          0.095 m LVOT/AV VTI ratio: 0.56  AORTA Ao Root diam: 3.80 cm MITRAL VALVE                TRICUSPID VALVE MV Area (PHT): 3.99 cm     TR Peak grad:   21.2 mmHg MV Area VTI:   1.17 cm     TR Vmax:        230.00 cm/s MV Peak grad:  8.0 mmHg MV Mean grad:  3.0 mmHg     SHUNTS MV Vmax:       1.41 m/s     Systemic VTI:  0.09 m MV Vmean:      73.5 cm/s    Systemic Diam: 2.20 cm MV Decel Time: 190 msec MV E velocity: 136.00 cm/s Rozell Searing Custovic Electronically signed by Clotilde Dieter Signature Date/Time: 12/27/2022/1:22:46 PM    Final    DG Chest 2 View  Result Date: 12/26/2022 CLINICAL DATA:  Chest pain. EXAM: CHEST - 2 VIEW COMPARISON:  05/30/2022. FINDINGS: Low lung volume. There are diffuse increased interstitial markings when compared to the prior exam. Bilateral lung fields are otherwise clear. No acute consolidation or lung collapse. Bilateral costophrenic angles are clear. Stable cardio-mediastinal silhouette. No acute osseous abnormalities. The soft tissues are within normal limits. IMPRESSION: *In appropriate clinical setting, findings favor mild congestive heart failure/pulmonary edema. Electronically Signed   By: Jules Schick M.D.   On: 12/26/2022 18:28   MR CARDIAC MORPHOLOGY W WO CONTRAST  Result Date: 12/25/2022 CLINICAL DATA:   Cardiomyopathy, hx of CAD/PCI. Eval for infiltrative process. EXAM: CARDIAC MRI TECHNIQUE: The patient was scanned on a 1.5 Tesla Siemens magnet. A dedicated cardiac coil was used. Functional imaging was done using Fiesta sequences. 2,3, and 4 chamber views were done to assess for RWMA's. Modified Simpson's rule using a short axis stack was used to calculate an ejection fraction on a dedicated work [employer], [employer]. The patient received 15 cc of Gadavist. After 10 minutes inversion recovery sequences were used to assess for infiltration and scar tissue. Velocity flow mapping performed in the ascending aorta and main pulmonary artery. CONTRAST:  15 cc  of Gadavist FINDINGS: 1. Mildly dilated left ventricular size, mild LV thickness, moderately reduced LV systolic function (LVEF = 37%). There is global hypokinesis. There is diffuse midwall late gadolinium enhancement in the left ventricular myocardium. LVEDV: 235 ml LVESV: 149 ml SV: 86 ml CO: 5L/min Myocardial mass: 207g LV native T1 value 1104 ms (normal <1000 ms) LV ECV value 62%.  Normal <30%. 2. Normal right ventricular size, thickness, moderately reduced systolic function (RVEF = 30%). There is global hypokinesis. 3.  Mildly dilated left atrial size. normal right atrial size. 4. Normal size of the aortic root, ascending aorta and pulmonary artery. 5. Mild mitral regurgitation, trivial pulmonary regurgitation. No significant valvular abnormalities. 6.  Normal pericardium.  trace pericardial effusion. IMPRESSION: 1. Mildly dilated LV, moderately reduced LV systolic function. LVEF 37%. 2.  Diffuse midwall LGE/scar 3. Elevated native T1 and ECV values suggesting infiltrative disease. 4.  Moderately reduced RV function. 5.  Trivial  pericardial effusion 6. Findings suggest infiltrative cardiomyopathy. Recommend complete workup for amyloidosis Electronically Signed   By: Debbe Odea M.D.   On: 12/25/2022 17:50   MR CARDIAC VELOCITY FLOW  MAP  Result Date: 12/25/2022 CLINICAL DATA:  Cardiomyopathy, hx of CAD/PCI. Eval for infiltrative process. EXAM: CARDIAC MRI TECHNIQUE: The patient was scanned on a 1.5 Tesla Siemens magnet. A dedicated cardiac coil was used. Functional imaging was done using Fiesta sequences. 2,3, and 4 chamber views were done to assess for RWMA's. Modified Simpson's rule using a short axis stack was used to calculate an ejection fraction on a dedicated work [employer] officer, [employer]. The patient received 15 cc of Gadavist. After 10 minutes inversion recovery sequences were used to assess for infiltration and scar tissue. Velocity flow mapping performed in the ascending aorta and main pulmonary artery. CONTRAST:  15 cc  of Gadavist FINDINGS: 1. Mildly dilated left ventricular size, mild LV thickness, moderately reduced LV systolic function (LVEF = 37%). There is global hypokinesis. There is diffuse midwall late gadolinium enhancement in the left ventricular myocardium. LVEDV: 235 ml LVESV: 149 ml SV: 86 ml CO: 5L/min Myocardial mass: 207g LV native T1 value 1104 ms (normal <1000 ms) LV ECV value 62%.  Normal <30%. 2. Normal right ventricular size, thickness, moderately reduced systolic function (RVEF = 30%). There is global hypokinesis. 3.  Mildly dilated left atrial size. normal right atrial size. 4. Normal size of the aortic root, ascending aorta and pulmonary artery. 5. Mild mitral regurgitation, trivial pulmonary regurgitation. No significant valvular abnormalities. 6.  Normal pericardium.  trace pericardial effusion. IMPRESSION: 1. Mildly dilated LV, moderately reduced LV systolic function. LVEF 37%. 2.  Diffuse midwall LGE/scar 3. Elevated native T1 and ECV values suggesting infiltrative disease. 4.  Moderately reduced RV function. 5.  Trivial pericardial effusion 6. Findings suggest infiltrative cardiomyopathy. Recommend complete workup for amyloidosis Electronically Signed   By: Debbe Odea M.D.   On:  12/25/2022 17:50   MR CARDIAC VELOCITY FLOW MAP  Result Date: 12/25/2022 CLINICAL DATA:  Cardiomyopathy, hx of CAD/PCI. Eval for infiltrative process. EXAM: CARDIAC MRI TECHNIQUE: The patient was scanned on a 1.5 Tesla Siemens magnet. A dedicated cardiac coil was used. Functional imaging was done using Fiesta sequences. 2,3, and 4 chamber views were done to assess for RWMA's. Modified Simpson's rule using a short axis stack was used to calculate an ejection fraction on a dedicated work [employer] officer, [employer]. The patient received 15 cc of Gadavist. After 10 minutes inversion recovery sequences were used to assess for infiltration and scar tissue. Velocity flow mapping performed in the ascending aorta and main pulmonary artery. CONTRAST:  15 cc  of Gadavist FINDINGS: 1. Mildly dilated left ventricular size, mild LV thickness, moderately reduced LV systolic function (LVEF = 37%). There is global hypokinesis. There is diffuse midwall late gadolinium enhancement in the left ventricular myocardium. LVEDV: 235 ml LVESV: 149 ml SV: 86 ml CO: 5L/min Myocardial mass: 207g LV native T1 value 1104 ms (normal <1000 ms) LV ECV value 62%.  Normal <30%. 2. Normal right ventricular size, thickness, moderately reduced systolic function (RVEF = 30%). There is global hypokinesis. 3.  Mildly dilated left atrial size. normal right atrial size. 4. Normal size of the aortic root, ascending aorta and pulmonary artery. 5. Mild mitral regurgitation, trivial pulmonary regurgitation. No significant valvular abnormalities. 6.  Normal pericardium.  trace pericardial effusion. IMPRESSION: 1. Mildly dilated LV, moderately reduced LV systolic function. LVEF 37%. 2.  Diffuse midwall  LGE/scar 3. Elevated native T1 and ECV values suggesting infiltrative disease. 4.  Moderately reduced RV function. 5.  Trivial pericardial effusion 6. Findings suggest infiltrative cardiomyopathy. Recommend complete workup for amyloidosis Electronically Signed    By: Debbe Odea M.D.   On: 12/25/2022 17:50    ECHO as above  TELEMETRY reviewed by me 12/31/2022: sinus rhythm rate 80s, NSVT this AM noted  EKG reviewed by me: sinus rhythm rate 92 bpm, PVCs, nonspecific ST-T changes  Data reviewed by me 12/31/2022: last 24h vitals tele labs imaging I/O hospitalist progress note, advanced heart failure notes  Principal Problem:   Acute on chronic systolic CHF (congestive heart failure) (HCC) Active Problems:   CAD with hx of STEMI 2018 S/P RCA stent   Diabetes mellitus (HCC)   OSA (obstructive sleep apnea)   Cardiomyopathy, ischemic and infiltrative (HCC)   Hypotension   Obesity, Class III, BMI 40-49.9 (morbid obesity) (HCC)   Elevated troponin    ASSESSMENT AND PLAN:   Jesse [name] is a 52 y.o. male  with a past medical history of CAD (MI 2004/2007, inf STEMI 2018 s/p overlapping DES to RCA), DM2, hyperlipidemia, HTN, OSA, tobacco use and chronic heart failure who presented to the ED on 12/26/2022 for fluid overload. Cardiology was consulted for further evaluation.   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 Worsening edema for a few weeks, no significant change in baseline SOB/orthopnea. BNP elevated at 827. Troponins 144 > 202. CXR with mild vascular congestion. Borderline hypotensive since admission, no dizziness/lightheadedness.  -Continue torsemide 40 mg twice daily.  -Restart home metoprolol succinate 12.5 mg daily. Continue Jardiance 10 mg daily. Midodrine reduced to 5 mg tid today. Further additions to GDMT limited by borderline BP, renal function.  -Kappa/lambda light chains, Urine immunofixation, and multiple myeloma labs pending for additional evaluation of amyloidosis.  -Troponin elevation most consistent with  demand/supply mismatch and not ACS in the setting of acute heart failure. -Advanced heart failure team following, appreciate their input. Recommending PYP and TTR genetic panel pending amyloid lab results.  # Coronary artery disease # Hyperlipidemia Patient with hx of CAD, STEMI in 2018 with overlapping DES to RCA. Denies any current or recent CP, anginal symptoms.  -Continue aspirin 81 mg daily and plavix 75 mg daily.  -Patient has not been taking statin outpatient.  # Obstructive sleep apnea Patient with hx of OSA, wears BiPAP nightly at home.  -Continue home OSA treatment.  Ok for discharge today from a cardiac perspective. Will arrange for follow up in clinic with Dr. Juliann Pares in 1-2 weeks.    This patient's plan of care was discussed and created with Dr. Juliann Pares and she is in agreement.  Signed: Gale Journey, PA-C  12/31/2022, 10:13 AM [facility]

## 2023-01-01 ENCOUNTER — Telehealth: Payer: Self-pay | Admitting: Cardiology

## 2023-01-01 ENCOUNTER — Telehealth: Payer: Self-pay

## 2023-01-01 DIAGNOSIS — I5022 Chronic systolic (congestive) heart failure: Secondary | ICD-10-CM

## 2023-01-01 LAB — IMMUNOFIXATION, URINE

## 2023-01-01 NOTE — Progress Notes (Signed)
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 Will see Dr Candise Che 12/12 at 1030. Will continue to monitor.

## 2023-01-01 NOTE — Telephone Encounter (Signed)
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 Clearnce Hasten.

## 2023-01-01 NOTE — Telephone Encounter (Signed)
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 Lab workup pending at discharge, but free light chain ratio severely abnormal at 0.12. Will place urgent referral to Dr. Leonides Schanz at [facility] for biopsy and likely treatment. Results and plan discussed over the phone with patient, he is agreeable to travel to [place].

## 2023-01-02 ENCOUNTER — Other Ambulatory Visit: Payer: 59

## 2023-01-02 ENCOUNTER — Inpatient Hospital Stay: Payer: 59 | Attending: Hematology | Admitting: Hematology

## 2023-01-02 VITALS — BP 125/70 | HR 91 | Temp 97.5°F | Resp 20 | Wt 253.2 lb

## 2023-01-02 DIAGNOSIS — I252 Old myocardial infarction: Secondary | ICD-10-CM | POA: Diagnosis not present

## 2023-01-02 DIAGNOSIS — E119 Type 2 diabetes mellitus without complications: Secondary | ICD-10-CM | POA: Diagnosis not present

## 2023-01-02 DIAGNOSIS — D472 Monoclonal gammopathy: Secondary | ICD-10-CM | POA: Diagnosis not present

## 2023-01-02 DIAGNOSIS — D4989 Neoplasm of unspecified behavior of other specified sites: Secondary | ICD-10-CM

## 2023-01-02 DIAGNOSIS — Z955 Presence of coronary angioplasty implant and graft: Secondary | ICD-10-CM | POA: Diagnosis not present

## 2023-01-02 DIAGNOSIS — G4730 Sleep apnea, unspecified: Secondary | ICD-10-CM | POA: Diagnosis not present

## 2023-01-02 DIAGNOSIS — Z7982 Long term (current) use of aspirin: Secondary | ICD-10-CM | POA: Insufficient documentation

## 2023-01-02 DIAGNOSIS — I2510 Atherosclerotic heart disease of native coronary artery without angina pectoris: Secondary | ICD-10-CM | POA: Diagnosis not present

## 2023-01-02 DIAGNOSIS — Z7902 Long term (current) use of antithrombotics/antiplatelets: Secondary | ICD-10-CM | POA: Insufficient documentation

## 2023-01-02 DIAGNOSIS — Z79899 Other long term (current) drug therapy: Secondary | ICD-10-CM | POA: Diagnosis not present

## 2023-01-02 DIAGNOSIS — I255 Ischemic cardiomyopathy: Secondary | ICD-10-CM | POA: Diagnosis not present

## 2023-01-02 DIAGNOSIS — F17210 Nicotine dependence, cigarettes, uncomplicated: Secondary | ICD-10-CM | POA: Diagnosis not present

## 2023-01-02 DIAGNOSIS — R2242 Localized swelling, mass and lump, left lower limb: Secondary | ICD-10-CM | POA: Insufficient documentation

## 2023-01-02 DIAGNOSIS — R0602 Shortness of breath: Secondary | ICD-10-CM | POA: Insufficient documentation

## 2023-01-02 DIAGNOSIS — I110 Hypertensive heart disease with heart failure: Secondary | ICD-10-CM | POA: Insufficient documentation

## 2023-01-02 DIAGNOSIS — Z7984 Long term (current) use of oral hypoglycemic drugs: Secondary | ICD-10-CM | POA: Diagnosis not present

## 2023-01-02 LAB — LACTATE DEHYDROGENASE: LDH: 212 U/L — ABNORMAL HIGH (ref 98–192)

## 2023-01-02 LAB — CMP (CANCER CENTER ONLY)
ALT: 25 U/L (ref 0–44)
AST: 31 U/L (ref 15–41)
Albumin: 2.9 g/dL — ABNORMAL LOW (ref 3.5–5.0)
Alkaline Phosphatase: 157 U/L — ABNORMAL HIGH (ref 38–126)
Anion gap: 7 (ref 5–15)
BUN: 27 mg/dL — ABNORMAL HIGH (ref 6–20)
CO2: 41 mmol/L — ABNORMAL HIGH (ref 22–32)
Calcium: 9.1 mg/dL (ref 8.9–10.3)
Chloride: 91 mmol/L — ABNORMAL LOW (ref 98–111)
Creatinine: 1.06 mg/dL (ref 0.61–1.24)
GFR, Estimated: >60 mL/min (ref >60)
Glucose, Bld: 194 mg/dL — ABNORMAL HIGH (ref 70–99)
Potassium: 3.7 mmol/L (ref 3.5–5.1)
Sodium: 139 mmol/L (ref 135–145)
Total Bilirubin: 0.4 mg/dL (ref <1.2)
Total Protein: 6.6 g/dL (ref 6.5–8.1)

## 2023-01-02 LAB — CBC WITH DIFFERENTIAL (CANCER CENTER ONLY)
Abs Immature Granulocytes: 0.05 10*3/uL (ref 0.00–0.07)
Basophils Absolute: 0.1 10*3/uL (ref 0.0–0.1)
Basophils Relative: 1 %
Eosinophils Absolute: 0.2 10*3/uL (ref 0.0–0.5)
Eosinophils Relative: 2 %
HCT: 48.6 % (ref 39.0–52.0)
Hemoglobin: 16.3 g/dL (ref 13.0–17.0)
Immature Granulocytes: 1 %
Lymphocytes Relative: 23 %
Lymphs Abs: 2.4 10*3/uL (ref 0.7–4.0)
MCH: 28.2 pg (ref 26.0–34.0)
MCHC: 33.5 g/dL (ref 30.0–36.0)
MCV: 83.9 fL (ref 80.0–100.0)
Monocytes Absolute: 0.9 10*3/uL (ref 0.1–1.0)
Monocytes Relative: 9 %
Neutro Abs: 6.4 10*3/uL (ref 1.7–7.7)
Neutrophils Relative %: 64 %
Platelet Count: 451 10*3/uL — ABNORMAL HIGH (ref 150–400)
RBC: 5.79 MIL/uL (ref 4.22–5.81)
RDW: 16.2 % — ABNORMAL HIGH (ref 11.5–15.5)
WBC Count: 10 10*3/uL (ref 4.0–10.5)
nRBC: 0 % (ref 0.0–0.2)

## 2023-01-02 LAB — VITAMIN D 25 HYDROXY (VIT D DEFICIENCY, FRACTURES): Vit D, 25-Hydroxy: 9.38 ng/mL — ABNORMAL LOW (ref 30–100)

## 2023-01-02 NOTE — Progress Notes (Signed)
Received referral for patient on 01/01/23, pt scheduled with Dr Candise Che for visit on 01/02/23. PET and BM scheduled for patient. Will continue to monitor for any needs.

## 2023-01-02 NOTE — Progress Notes (Signed)
HEMATOLOGY/ONCOLOGY CONSULTATION NOTE  Date of Service: 01/02/2023  Patient Care Team: Leanna Sato, MD as PCP - General (Family Medicine)  CHIEF COMPLAINTS/PURPOSE OF CONSULTATION:  Evaluation and management of possible amyloidosis  HISTORY OF PRESENTING ILLNESS:   Jesse [name] is a wonderful 52 y.o. male who has been referred to [place] by his cardiologist, Dr. Elwyn Lade, for evaluation and management of possible amyloidosis.   Today, he is accompanied by his wife. Patient presented to the ED on 12/26/2022 for IV diuresis. Patient reports that at the time that he was diuresed at the hospital, his breathing habits were okay and his main concern was fatigue.   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 He also has a history of HTN and DM. Patient has been on blood pressure medications for several years since his MI.   He reports that his heart function has declined recently. Echocardiogram on 12/27/2022 showed left ventricular ejection fraction of 35-40%.   Patient reports that his symptoms began with leg weakness 3 months ago.   Patient reports having a lump in his left lower extremity present for 3-4 months. He notes that the lump was previously larger in size for a few weeks, but has since decreased in size. Patient denies any pain in the lump on palpation. He notes that he does sometimes cross his right leg over his left. Bone survey on 12/27/2022 showed findings of 8.5 cm mixed/sclerotic lesion in the lateral distal left femoral shaft, suggestive of nonossifying fibroma. There were no obvious findings of myeloma lesion.   He complains of consistent upper and lower back pain. Patient reports findings of herniated disc in upper and lower back on cardiac MRI. He notes that his lower back pain is worsened when driving frequently. He reports that he initially thought that the weakness in his legs were related to his back issues.   Patient reports gradual  SOB, but denies any sudden SOB, chest pain, increased leg swelling, or abdominal pain. He reports having a hernia which is consistently present, but is not significantly bothersome.   He complains of constant fatigue and notes that he recently started using a BiPAP machine.   Patient reports that he continues to smoke 2 packs of Black & Mild cigars a day. He notes that he typically may only smoke half of each cigar and does not finish smoking an entire cigar.   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   He reports that he is due for a colonoscopy and notes no issues on previous colonoscopies.   He reports that he is adopted and his adoptive parents unfortunately passed away this year. In regards to biological family history, he reports that his uncle passed away from an aneurysm and most other family members passed away due to drug use.   He denies any chemical or radiation exposure in his line of work.   He notes some fluctuation in his weight depending on his fluid status and notes that his weight was 284 pounds at one point. However, his baseline weight has been stable between 250-255 pounds in general over the last 6 months.   MEDICAL HISTORY:  Past Medical History:  Diagnosis Date   CHF (congestive heart failure) (HCC)    Coronary artery disease    Inferior myocardial infarction in 2004.  He was treated with thrombolytics at [facility].  He then had  an inferior MI in March 2007.  He received a Horizon study stent 3.5 x 20 mm in the distal RCA at [facility].  No other obstructive disease.   Diabetes mellitus    TYPE II   Erectile dysfunction    Hyperlipidemia    Hypertension    MI (myocardial infarction) (HCC) 2956,2130   Morbid obesity (HCC)    Sleep apnea     SURGICAL HISTORY: Past Surgical History:  Procedure Laterality Date   CARDIAC CATHETERIZATION     @ [facility]   CHOLECYSTECTOMY     LEFT HEART CATH AND CORONARY ANGIOGRAPHY  N/A 07/05/2016   Procedure: Left Heart Cath and Coronary Angiography;  Surgeon: Alwyn Pea, MD;  Location: [facility] INVASIVE CV LAB;  Service: Cardiovascular;  Laterality: N/A;   SEPTOPLASTY      SOCIAL HISTORY: Social History   Socioeconomic History   Marital status: Married    Spouse name: Not on file   Number of children: Not on file   Years of education: Not on file   Highest education level: Not on file  Occupational History   Not on file  Tobacco Use   Smoking status: Every Day    Current packs/day: 0.00    Average packs/day: 1 pack/day for 15.0 years (15.0 ttl pk-yrs)    Types: Cigars, Cigarettes    Start date: 2005    Last attempt to quit: 2020    Years since quitting: 4.9   Smokeless tobacco: Never  Vaping Use   Vaping status: Never Used  Substance and Sexual Activity   Alcohol use: No   Drug use: No    Comment: prior hx in his 20's of cocaine and marijuana   Sexual activity: Not on file  Other Topics Concern   Not on file  Social History Narrative   Not on file   Social Drivers of Health   Financial Resource Strain: Not on file  Food Insecurity: No Food Insecurity (12/27/2022)   Hunger Vital Sign    Worried About Running Out of Food in the Last Year: Never true    Ran Out of Food in the Last Year: Never true  Transportation Needs: No Transportation Needs (12/27/2022)   PRAPARE - Administrator, Civil Service (Medical): No    Lack of Transportation (Non-Medical): No  Physical Activity: Not on file  Stress: Not on file  Social Connections: Not on file  Intimate Partner Violence: Not At Risk (12/27/2022)   Humiliation, Afraid, Rape, and Kick questionnaire    Fear of Current or Ex-Partner: No    Emotionally Abused: No    Physically Abused: No    Sexually Abused: No    FAMILY HISTORY: Family History  Adopted: Yes    ALLERGIES:  has no known allergies.  MEDICATIONS:  Current Outpatient Medications  Medication Sig Dispense Refill    aspirin EC 81 MG tablet Take 1 tablet (81 mg total) by mouth daily. Swallow whole. 30 tablet 2   clopidogrel (PLAVIX) 75 MG tablet Take 1 tablet (75 mg total) by mouth daily. 30 tablet 2   empagliflozin (JARDIANCE) 25 MG TABS tablet Take 1 tablet (25 mg total) by mouth daily. 30 tablet 2   metoprolol succinate (TOPROL-XL) 25 MG 24 hr tablet Take 0.5 tablets (12.5 mg total) by mouth daily. Take with or immediately following a meal. 45 tablet 2   midodrine (PROAMATINE) 5 MG tablet Take 1 tablet (5 mg total) by mouth 3 (three) times daily with meals.  90 tablet 0   nitroGLYCERIN (NITROSTAT) 0.4 MG SL tablet Place 1 tablet (0.4 mg total) under the tongue every 5 (five) minutes as needed for chest pain. 30 tablet 0   potassium chloride SA (KLOR-CON M) 20 MEQ tablet Take 1 tablet (20 mEq total) by mouth 3 (three) times a week.     rosuvastatin (CRESTOR) 20 MG tablet Take 20 mg by mouth daily. (Patient not taking: Reported on 12/27/2022)     spironolactone (ALDACTONE) 25 MG tablet Take 0.5 tablets (12.5 mg total) by mouth daily.     torsemide (DEMADEX) 20 MG tablet Take 2 tablets (40 mg total) by mouth 2 (two) times daily. 120 tablet 3   traMADol (ULTRAM) 50 MG tablet Take 50 mg by mouth every 6 (six) hours as needed for pain.     No current facility-administered medications for this visit.    REVIEW OF SYSTEMS:    10 Point review of Systems was done is negative except as noted above.  PHYSICAL EXAMINATION: ECOG PERFORMANCE STATUS: 2 - Symptomatic, <50% confined to bed  . Vitals:   01/02/23 1030  BP: 125/70  Pulse: 91  Resp: 20  Temp: (!) 97.5 F (36.4 C)  SpO2: 100%   Filed Weights   01/02/23 1030  Weight: 253 lb 3.2 oz (114.9 kg)   .Body mass index is 35.31 kg/m.  GENERAL:alert, in no acute distress and comfortable SKIN: no acute rashes, no significant lesions EYES: conjunctiva are pink and non-injected, sclera anicteric OROPHARYNX: MMM, no exudates, no oropharyngeal erythema or  ulceration NECK: supple, no JVD LYMPH: no palpable lymphadenopathy in the cervical, axillary or inguinal regions LUNGS: clear to auscultation b/l with normal respiratory effort HEART: regular rate & rhythm ABDOMEN:  normoactive bowel sounds , non tender, not distended. Extremity: no pedal edema PSYCH: alert & oriented x 3 with fluent speech NEURO: no focal motor/sensory deficits  LABORATORY DATA:  I have reviewed the data as listed  .    Latest Ref Rng & Units 01/02/2023   11:46 AM 12/31/2022    2:52 AM 12/30/2022    6:50 AM  CBC  WBC 4.0 - 10.5 K/uL 10.0  9.3  9.2   Hemoglobin 13.0 - 17.0 g/dL 62.1  30.8  65.7   Hematocrit 39.0 - 52.0 % 48.6  45.9  44.5   Platelets 150 - 400 K/uL 451  463  441     .    Latest Ref Rng & Units 01/02/2023   11:46 AM 12/31/2022    2:52 AM 12/30/2022    6:50 AM  CMP  Glucose 70 - 99 mg/dL 846  962  952   BUN 6 - 20 mg/dL 27  36  36   Creatinine 0.61 - 1.24 mg/dL 8.41  3.24  4.01   Sodium 135 - 145 mmol/L 139  136  134   Potassium 3.5 - 5.1 mmol/L 3.7  4.5  3.5   Chloride 98 - 111 mmol/L 91  90  90   CO2 22 - 32 mmol/L 41  35  31   Calcium 8.9 - 10.3 mg/dL 9.1  8.2  8.2   Total Protein 6.5 - 8.1 g/dL 6.6     Total Bilirubin <1.2 mg/dL 0.4     Alkaline Phos 38 - 126 U/L 157     AST 15 - 41 U/L 31     ALT 0 - 44 U/L 25        RADIOGRAPHIC STUDIES: I have personally reviewed  the radiological images as listed and agreed with the findings in the report. MR FEMUR LEFT W WO CONTRAST Result Date: 12/28/2022 CLINICAL DATA:  Bone lesion, femur, incidental. Palpable lump in the mid to distal left thigh for 2-3 months. History of multiple myeloma. EXAM: MR OF THE LEFT LOWER EXTREMITY WITHOUT AND WITH CONTRAST TECHNIQUE: Multiplanar, multisequence MR imaging of the left thigh was performed both before and after administration of intravenous contrast. CONTRAST:  10mL GADAVIST GADOBUTROL 1 MMOL/ML IV SOLN COMPARISON:  Lower extremity venous Doppler  ultrasound 04/14/2022. Bone survey 12/27/2022. FINDINGS: Bones/Joint/Cartilage Both thighs are included on the coronal images. There is a broad-based exostosis involving the anterior aspect of the distal left femur, corresponding with the radiographic abnormality. This exostosis measures approximately 2.6 x 1.8 x 6.3 cm and is consistent with an incidental osteochondroma. No associated marrow enhancement, cartilaginous cap thickening or pseudo bursal formation. The left femur otherwise appears unremarkable. There are no suspicious osseous lesions. No evidence of acute fracture, dislocation or femoral head osteonecrosis. No significant arthropathy or effusions identified at either knee or hip. Ligaments No gross ligamentous abnormalities on large field-of-view imaging. Muscles and Tendons Mild left common hamstring tendinosis without tear. No focal muscular abnormalities are identified within the left thigh. Mild asymmetric T2 hyperintensity demonstrated within the right thigh musculature, greatest within the vastus lateralis. Soft tissues At least moderate diffuse subcutaneous edema is noted within both thighs. This appears fairly symmetric on the coronal images. No focal fluid collection or suspicious soft tissue enhancement identified. The left testis appears retracted into the left inguinal canal. This appears changed from pelvic CT 03/06/2019 and therefore not consistent with an incompletely descended testicle. Correlate clinically. IMPRESSION: 1. The palpable abnormality in the distal left thigh corresponds with an incidental osteochondroma. No suspicious osseous lesions identified. 2. At least moderate diffuse subcutaneous edema within both thighs, nonspecific. No focal fluid collection or suspicious soft tissue enhancement identified. 3. Mild asymmetric T2 hyperintensity within the right thigh musculature, greatest within the vastus lateralis. This is nonspecific and could be secondary to subacute  denervation, venous stasis or myositis. 4. The left testis appears retracted into the left inguinal canal. Correlate clinically. Electronically Signed   By: Carey Bullocks M.D.   On: 12/28/2022 08:43   DG Bone Survey Met Result Date: 12/27/2022 CLINICAL DATA:  Multiple myeloma, posterior neck pain around C7, lump on left distal femur x2 months EXAM: METASTATIC BONE SURVEY COMPARISON:  None Available. FINDINGS: No lytic lesion in the calvarium. Bilateral upper extremities are within normal limits. Mild degenerative changes of the lower cervical spine, without lytic lesion. Thoracic spine is within normal limits. Mild degenerative changes of the upper lumbar spine, without lytic lesion. Lungs are clear.  The heart is normal in size. No lytic lesion in the bony pelvis. Right lower extremity is within normal limits. In the left lower extremity, there is an 8.5 cm mixed/sclerotic lesion in the lateral distal femoral shaft with overlying cortical thickening/periosteal reaction, with a benign appearance. This appearance favors a nonaggressive lesions such as nonossifying fibroma or less likely enchondroma. Multiple myeloma is considered unlikely. IMPRESSION: 8.5 cm mixed/sclerotic lesion in the lateral distal left femoral shaft, favoring a nonaggressive lesion such as nonossifying fibroma or less likely enchondroma. Multiple myeloma is considered unlikely. Otherwise negative multiple myeloma. Follow-up orthopedic consultation is suggested with dedicated femur radiographs prior to possible MR. Electronically Signed   By: Charline Bills M.D.   On: 12/27/2022 17:20   ECHOCARDIOGRAM COMPLETE Result Date: 12/27/2022  ECHOCARDIOGRAM REPORT   Patient Name:   Jesse [name] Date of Exam: 12/27/2022 Medical Rec #:  [id number]        Height:       71.0 in Accession #:    [id number]       Weight:       281.0 lb Date of Birth:  12/23/1970        BSA:          2.436 m Patient Age:    52 years         BP:           98/73  mmHg Patient Gender: M                HR:           81 bpm. Exam Location:  [facility] Procedure: 2D Echo, 3D Echo, Cardiac Doppler, Color Doppler and Strain Analysis Indications:     CHF  History:         Patient has prior history of Echocardiogram examinations. CHF                  and Cardiomyopathy, CAD and Previous Myocardial Infarction,                  Signs/Symptoms:Dyspnea; Risk Factors:Hypertension, Diabetes,                  Dyslipidemia and Sleep Apnea.  Sonographer:     Mikki Harbor Referring Phys:  [id number] Andris Baumann Diagnosing Phys: Clotilde Dieter  Sonographer Comments: Patient is obese. Global longitudinal strain was attempted. IMPRESSIONS  1. Left ventricular ejection fraction, by estimation, is 35 to 40%. The left ventricle has moderately decreased function. The left ventricle demonstrates global hypokinesis. Left ventricular diastolic parameters are consistent with Grade I diastolic dysfunction (impaired relaxation).  2. Right ventricular systolic function is low normal. The right ventricular size is moderately enlarged. There is normal pulmonary artery systolic pressure. The estimated right ventricular systolic pressure is 29.2 mmHg.  3. Left atrial size was moderately dilated.  4. Right atrial size was mildly dilated.  5. The mitral valve is degenerative. Moderate mitral valve regurgitation.  6. Tricuspid valve regurgitation is moderate.  7. The aortic valve is normal in structure. Aortic valve regurgitation is not visualized. No aortic stenosis is present. FINDINGS  Left Ventricle: Left ventricular ejection fraction, by estimation, is 35 to 40%. The left ventricle has moderately decreased function. The left ventricle demonstrates global hypokinesis. The left ventricular internal cavity size was normal in size. There is no left ventricular hypertrophy. Left ventricular diastolic parameters are consistent with Grade I diastolic dysfunction (impaired relaxation). Right Ventricle: The right  ventricular size is moderately enlarged. No increase in right ventricular wall thickness. Right ventricular systolic function is low normal. There is normal pulmonary artery systolic pressure. The tricuspid regurgitant velocity  is 2.30 m/s, and with an assumed right atrial pressure of 8 mmHg, the estimated right ventricular systolic pressure is 29.2 mmHg. Left Atrium: Left atrial size was moderately dilated. Right Atrium: Right atrial size was mildly dilated. Pericardium: There is no evidence of pericardial effusion. Mitral Valve: The mitral valve is degenerative in appearance. Moderate mitral valve regurgitation. MV peak gradient, 8.0 mmHg. The mean mitral valve gradient is 3.0 mmHg. Tricuspid Valve: The tricuspid valve is grossly normal. Tricuspid valve regurgitation is moderate. Aortic Valve: The aortic valve is normal in structure. Aortic valve regurgitation is not visualized. No aortic stenosis is present. Aortic  valve mean gradient measures 2.0 mmHg. Aortic valve peak gradient measures 3.1 mmHg. Aortic valve area, by VTI measures 2.15 cm. Pulmonic Valve: The pulmonic valve was grossly normal. Pulmonic valve regurgitation is not visualized. Aorta: The aortic root is normal in size and structure. IAS/Shunts: No atrial level shunt detected by color flow Doppler.  LEFT VENTRICLE PLAX 2D LVIDd:         5.30 cm LVIDs:         4.10 cm LV PW:         1.40 cm LV IVS:        1.40 cm LVOT diam:     2.20 cm LV SV:         36 LV SV Index:   15 LVOT Area:     3.80 cm  LV Volumes (MOD) LV vol d, MOD A2C: 98.6 ml LV vol d, MOD A4C: 117.0 ml LV vol s, MOD A2C: 71.0 ml LV vol s, MOD A4C: 73.1 ml LV SV MOD A2C:     27.6 ml LV SV MOD A4C:     117.0 ml LV SV MOD BP:      34.9 ml RIGHT VENTRICLE RV Basal diam:  4.05 cm RV Mid diam:    3.80 cm RV S prime:     9.34 cm/s LEFT ATRIUM             Index        RIGHT ATRIUM           Index LA diam:        5.20 cm 2.13 cm/m   RA Area:     22.10 cm LA Vol (A2C):   83.8 ml 34.40 ml/m   RA Volume:   67.50 ml  27.70 ml/m LA Vol (A4C):   93.6 ml 38.42 ml/m LA Biplane Vol: 95.8 ml 39.32 ml/m  AORTIC VALVE                    PULMONIC VALVE AV Area (Vmax):    2.90 cm     PV Vmax:       0.66 m/s AV Area (Vmean):   2.23 cm     PV Peak grad:  1.7 mmHg AV Area (VTI):     2.15 cm AV Vmax:           87.70 cm/s AV Vmean:          63.300 cm/s AV VTI:            0.168 m AV Peak Grad:      3.1 mmHg AV Mean Grad:      2.0 mmHg LVOT Vmax:         67.00 cm/s LVOT Vmean:        37.100 cm/s LVOT VTI:          0.095 m LVOT/AV VTI ratio: 0.56  AORTA Ao Root diam: 3.80 cm MITRAL VALVE                TRICUSPID VALVE MV Area (PHT): 3.99 cm     TR Peak grad:   21.2 mmHg MV Area VTI:   1.17 cm     TR Vmax:        230.00 cm/s MV Peak grad:  8.0 mmHg MV Mean grad:  3.0 mmHg     SHUNTS MV Vmax:       1.41 m/s     Systemic VTI:  0.09 m MV Vmean:  73.5 cm/s    Systemic Diam: 2.20 cm MV Decel Time: 190 msec MV E velocity: 136.00 cm/s Clotilde Dieter Electronically signed by Clotilde Dieter Signature Date/Time: 12/27/2022/1:22:46 PM    Final    DG Chest 2 View Result Date: 12/26/2022 CLINICAL DATA:  Chest pain. EXAM: CHEST - 2 VIEW COMPARISON:  05/30/2022. FINDINGS: Low lung volume. There are diffuse increased interstitial markings when compared to the prior exam. Bilateral lung fields are otherwise clear. No acute consolidation or lung collapse. Bilateral costophrenic angles are clear. Stable cardio-mediastinal silhouette. No acute osseous abnormalities. The soft tissues are within normal limits. IMPRESSION: *In appropriate clinical setting, findings favor mild congestive heart failure/pulmonary edema. Electronically Signed   By: Jules Schick M.D.   On: 12/26/2022 18:28   MR CARDIAC MORPHOLOGY W WO CONTRAST Result Date: 12/25/2022 CLINICAL DATA:  Cardiomyopathy, hx of CAD/PCI. Eval for infiltrative process. EXAM: CARDIAC MRI TECHNIQUE: The patient was scanned on a 1.5 Tesla Siemens magnet. A dedicated cardiac  coil was used. Functional imaging was done using Fiesta sequences. 2,3, and 4 chamber views were done to assess for RWMA's. Modified Simpson's rule using a short axis stack was used to calculate an ejection fraction on a dedicated work [employer], [employer]. The patient received 15 cc of Gadavist. After 10 minutes inversion recovery sequences were used to assess for infiltration and scar tissue. Velocity flow mapping performed in the ascending aorta and main pulmonary artery. CONTRAST:  15 cc  of Gadavist FINDINGS: 1. Mildly dilated left ventricular size, mild LV thickness, moderately reduced LV systolic function (LVEF = 37%). There is global hypokinesis. There is diffuse midwall late gadolinium enhancement in the left ventricular myocardium. LVEDV: 235 ml LVESV: 149 ml SV: 86 ml CO: 5L/min Myocardial mass: 207g LV native T1 value 1104 ms (normal <1000 ms) LV ECV value 62%.  Normal <30%. 2. Normal right ventricular size, thickness, moderately reduced systolic function (RVEF = 30%). There is global hypokinesis. 3.  Mildly dilated left atrial size. normal right atrial size. 4. Normal size of the aortic root, ascending aorta and pulmonary artery. 5. Mild mitral regurgitation, trivial pulmonary regurgitation. No significant valvular abnormalities. 6.  Normal pericardium.  trace pericardial effusion. IMPRESSION: 1. Mildly dilated LV, moderately reduced LV systolic function. LVEF 37%. 2.  Diffuse midwall LGE/scar 3. Elevated native T1 and ECV values suggesting infiltrative disease. 4.  Moderately reduced RV function. 5.  Trivial pericardial effusion 6. Findings suggest infiltrative cardiomyopathy. Recommend complete workup for amyloidosis Electronically Signed   By: Debbe Odea M.D.   On: 12/25/2022 17:50   MR CARDIAC VELOCITY FLOW MAP Result Date: 12/25/2022 CLINICAL DATA:  Cardiomyopathy, hx of CAD/PCI. Eval for infiltrative process. EXAM: CARDIAC MRI TECHNIQUE: The patient was scanned on a 1.5 Tesla  Siemens magnet. A dedicated cardiac coil was used. Functional imaging was done using Fiesta sequences. 2,3, and 4 chamber views were done to assess for RWMA's. Modified Simpson's rule using a short axis stack was used to calculate an ejection fraction on a dedicated work [employer], [employer]. The patient received 15 cc of Gadavist. After 10 minutes inversion recovery sequences were used to assess for infiltration and scar tissue. Velocity flow mapping performed in the ascending aorta and main pulmonary artery. CONTRAST:  15 cc  of Gadavist FINDINGS: 1. Mildly dilated left ventricular size, mild LV thickness, moderately reduced LV systolic function (LVEF = 37%). There is global hypokinesis. There is diffuse midwall late gadolinium enhancement in the left ventricular myocardium. LVEDV: 235  ml LVESV: 149 ml SV: 86 ml CO: 5L/min Myocardial mass: 207g LV native T1 value 1104 ms (normal <1000 ms) LV ECV value 62%.  Normal <30%. 2. Normal right ventricular size, thickness, moderately reduced systolic function (RVEF = 30%). There is global hypokinesis. 3.  Mildly dilated left atrial size. normal right atrial size. 4. Normal size of the aortic root, ascending aorta and pulmonary artery. 5. Mild mitral regurgitation, trivial pulmonary regurgitation. No significant valvular abnormalities. 6.  Normal pericardium.  trace pericardial effusion. IMPRESSION: 1. Mildly dilated LV, moderately reduced LV systolic function. LVEF 37%. 2.  Diffuse midwall LGE/scar 3. Elevated native T1 and ECV values suggesting infiltrative disease. 4.  Moderately reduced RV function. 5.  Trivial pericardial effusion 6. Findings suggest infiltrative cardiomyopathy. Recommend complete workup for amyloidosis Electronically Signed   By: Debbe Odea M.D.   On: 12/25/2022 17:50   MR CARDIAC VELOCITY FLOW MAP Result Date: 12/25/2022 CLINICAL DATA:  Cardiomyopathy, hx of CAD/PCI. Eval for infiltrative process. EXAM: CARDIAC MRI TECHNIQUE: The  patient was scanned on a 1.5 Tesla Siemens magnet. A dedicated cardiac coil was used. Functional imaging was done using Fiesta sequences. 2,3, and 4 chamber views were done to assess for RWMA's. Modified Simpson's rule using a short axis stack was used to calculate an ejection fraction on a dedicated work [employer] officer, [employer]. The patient received 15 cc of Gadavist. After 10 minutes inversion recovery sequences were used to assess for infiltration and scar tissue. Velocity flow mapping performed in the ascending aorta and main pulmonary artery. CONTRAST:  15 cc  of Gadavist FINDINGS: 1. Mildly dilated left ventricular size, mild LV thickness, moderately reduced LV systolic function (LVEF = 37%). There is global hypokinesis. There is diffuse midwall late gadolinium enhancement in the left ventricular myocardium. LVEDV: 235 ml LVESV: 149 ml SV: 86 ml CO: 5L/min Myocardial mass: 207g LV native T1 value 1104 ms (normal <1000 ms) LV ECV value 62%.  Normal <30%. 2. Normal right ventricular size, thickness, moderately reduced systolic function (RVEF = 30%). There is global hypokinesis. 3.  Mildly dilated left atrial size. normal right atrial size. 4. Normal size of the aortic root, ascending aorta and pulmonary artery. 5. Mild mitral regurgitation, trivial pulmonary regurgitation. No significant valvular abnormalities. 6.  Normal pericardium.  trace pericardial effusion. IMPRESSION: 1. Mildly dilated LV, moderately reduced LV systolic function. LVEF 37%. 2.  Diffuse midwall LGE/scar 3. Elevated native T1 and ECV values suggesting infiltrative disease. 4.  Moderately reduced RV function. 5.  Trivial pericardial effusion 6. Findings suggest infiltrative cardiomyopathy. Recommend complete workup for amyloidosis Electronically Signed   By: Debbe Odea M.D.   On: 12/25/2022 17:50    ASSESSMENT & PLAN:   52 y.o. 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 Patient also has known ischemic cardiomyopathy.  PLAN:  -Cardiac MRI on 12/25/2022 does show findings concerning for possible infiltrative cardiomyopathy -myeloma labs from 12/27/2022 resulted after the clinic visit and showed a M spike of 0.3g/dl of IgA Lambda monoclonal protein. 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  Patient has no anemia  There is some uncertainty regarding whether findings of 8.5 cm mixed/sclerotic lesion in the lateral distal left femoral shaft is related to myeloma -did not feel an enlarged spleen during physical examination -discussed four elements to evaluate for plasma cell disorder completing blood tests ordering 24/hr urine test to evaluate his abnormal protein and evaluate if kidneys are leaky for protein ordering PET scan for further evaluation given that there is enough concern to r/o myeloma and to evaluate his large femoral lesion. bone marrow biopsy to evaluate plasma cells. Discussed that if his blood pressure is low, there may be a role not to sedate him for the bone marrow biopsy  -Discussed that if there are findings of AL amyloidosis and his cardiologist  feels that there is enough concern that amyloidosis is affecting his heart, there may not be a role to subject him to a heart biopsy.  -discussed that there may be a role for his cardiologist to consider a PYP scan for further evaluation -stay well hydrated to reasonable extent to keep kidneys flushed -continue to use  BiPAP machine to manage sleep apnea and prevent any increased pressure on the heart.  -recommend patient to stay UTD with his age-appropriate cancer screenings including colonoscopy -recommend patient to connect with PCP to evaluate prostate exam and monitor PSA levels  -answered all of patient's and his wife's questions in detail  FOLLOW-UP: Labs today PET/CT in 7 days CT bone marrow aspiration and biopsy in 5-7 days RTC with Dr Candise Che in 3 weeks(soon after PAL)  The total time spent in the appointment was 60 minutes* .  All of the patient's questions were answered with apparent satisfaction. The patient knows to call the clinic with any problems, questions or concerns.   Wyvonnia Lora MD MS AAHIVMS [facility] Hematology/Oncology Physician [facility]  .*Total Encounter Time as defined by the Centers for Medicare and Medicaid Services includes, in addition to the face-to-face time of a patient visit (documented in the note above) non-face-to-face time: obtaining and reviewing outside history, ordering and reviewing medications, tests or procedures, care coordination (communications with other health care professionals or caregivers) and documentation in the medical record.    I,Mitra Faeizi,acting as a Neurosurgeon for Wyvonnia Lora, MD.,have documented all relevant documentation on the behalf of Wyvonnia Lora, MD,as directed by  Wyvonnia Lora, MD while in the presence of Wyvonnia Lora, MD.  .I have reviewed the above documentation for accuracy and completeness, and I agree with the above. Johney Maine MD

## 2023-01-03 LAB — BETA 2 MICROGLOBULIN, SERUM: Beta-2 Microglobulin: 5.7 mg/L — ABNORMAL HIGH (ref 0.6–2.4)

## 2023-01-03 LAB — KAPPA/LAMBDA LIGHT CHAINS
Kappa free light chain: 55.1 mg/L — ABNORMAL HIGH (ref 3.3–19.4)
Kappa, lambda light chain ratio: 0.13 — ABNORMAL LOW (ref 0.26–1.65)
Lambda free light chains: 427.2 mg/L — ABNORMAL HIGH (ref 5.7–26.3)

## 2023-01-06 LAB — MULTIPLE MYELOMA PANEL, SERUM
Albumin SerPl Elph-Mcnc: 2.1 g/dL — ABNORMAL LOW (ref 2.9–4.4)
Albumin/Glob SerPl: 0.7 (ref 0.7–1.7)
Alpha 1: 0.3 g/dL (ref 0.0–0.4)
Alpha2 Glob SerPl Elph-Mcnc: 1.2 g/dL — ABNORMAL HIGH (ref 0.4–1.0)
B-Globulin SerPl Elph-Mcnc: 0.8 g/dL (ref 0.7–1.3)
Gamma Glob SerPl Elph-Mcnc: 1 g/dL (ref 0.4–1.8)
Globulin, Total: 3.4 g/dL (ref 2.2–3.9)
IgA: 510 mg/dL — ABNORMAL HIGH (ref 90–386)
IgG (Immunoglobin G), Serum: 779 mg/dL (ref 603–1613)
IgM (Immunoglobulin M), Srm: 45 mg/dL (ref 20–172)
M Protein SerPl Elph-Mcnc: 0.3 g/dL — ABNORMAL HIGH
Total Protein ELP: 5.5 g/dL — ABNORMAL LOW (ref 6.0–8.5)

## 2023-01-09 ENCOUNTER — Other Ambulatory Visit: Payer: Self-pay | Admitting: *Deleted

## 2023-01-09 ENCOUNTER — Encounter: Payer: 59 | Admitting: Family

## 2023-01-09 ENCOUNTER — Ambulatory Visit: Payer: 59 | Attending: Cardiology | Admitting: Cardiology

## 2023-01-09 VITALS — BP 99/66 | HR 83 | Ht 71.0 in | Wt 261.0 lb

## 2023-01-09 DIAGNOSIS — G4733 Obstructive sleep apnea (adult) (pediatric): Secondary | ICD-10-CM

## 2023-01-09 DIAGNOSIS — I2510 Atherosclerotic heart disease of native coronary artery without angina pectoris: Secondary | ICD-10-CM

## 2023-01-09 DIAGNOSIS — E854 Organ-limited amyloidosis: Secondary | ICD-10-CM

## 2023-01-09 DIAGNOSIS — I5022 Chronic systolic (congestive) heart failure: Secondary | ICD-10-CM | POA: Diagnosis not present

## 2023-01-09 DIAGNOSIS — Z9861 Coronary angioplasty status: Secondary | ICD-10-CM

## 2023-01-09 DIAGNOSIS — E1169 Type 2 diabetes mellitus with other specified complication: Secondary | ICD-10-CM

## 2023-01-09 DIAGNOSIS — I43 Cardiomyopathy in diseases classified elsewhere: Secondary | ICD-10-CM

## 2023-01-09 DIAGNOSIS — D472 Monoclonal gammopathy: Secondary | ICD-10-CM | POA: Diagnosis not present

## 2023-01-09 DIAGNOSIS — D4989 Neoplasm of unspecified behavior of other specified sites: Secondary | ICD-10-CM

## 2023-01-09 DIAGNOSIS — E8581 Light chain (AL) amyloidosis: Secondary | ICD-10-CM

## 2023-01-09 MED ORDER — TORSEMIDE 20 MG PO TABS: ORAL_TABLET | ORAL | 3 refills | Status: DC

## 2023-01-09 MED ORDER — POTASSIUM CHLORIDE CRYS ER 20 MEQ PO TBCR: 40.0000 meq | EXTENDED_RELEASE_TABLET | Freq: Every day | ORAL | 3 refills | Status: DC

## 2023-01-09 NOTE — Patient Instructions (Addendum)
Medication Changes:  INCREASE TORSEMIDE TO 80MG  IN THE MORNING AND 60MG  IN THE EVENING FOR 5-6 DAYS   AFTER 5-6 DAYS PLEASE DECREASE  TORSEMIDE TO 60MG  IN THE MORNING AND 40MG  IN THE EVENING   INCREASE POTASSIUM TO ONCE DAILY   Lab Work:  PLEASE RETURN FOR LABS IN 1 WEEK   Go over to the [facility]. Go pass the gift shop and have your blood work completed.  We will only call you if the results are abnormal or if the provider would like to make medication changes.  Follow-Up in: 2 WEEKS AS SCHEDULED   If you have any questions or concerns before your next appointment please send [place] a message through mychart or call our office at [phone] Monday-Friday 8 am-5 pm.   If you have an urgent need after hours on the weekend please call your Primary Cardiologist or the [facility] in [place] at [phone].   At the [facility], you and your health needs are our priority. We have a designated team specialized in the treatment of Heart Failure. This Care Team includes your primary Heart Failure Specialized Cardiologist (physician), Advanced Practice Providers (APPs- Physician Assistants and Nurse Practitioners), and Pharmacist who all work together to provide you with the care you need, when you need it.   You may see any of the following providers on your designated Care Team at your next follow up:  Dr. Arvilla Meres Dr. Marca Ancona Dr. Dorthula Nettles Dr. Theresia Bough Tonye Becket, NP Robbie Lis, [place] [address] [employer], [place] Brynda Peon, NP [place] Lee, NP Clarisa Kindred, NP Enos Fling, PharmD

## 2023-01-10 LAB — MULTIPLE MYELOMA PANEL, SERUM
Albumin SerPl Elph-Mcnc: 2.3 g/dL — ABNORMAL LOW (ref 2.9–4.4)
Albumin/Glob SerPl: 0.7 (ref 0.7–1.7)
Alpha 1: 0.3 g/dL (ref 0.0–0.4)
Alpha2 Glob SerPl Elph-Mcnc: 1.2 g/dL — ABNORMAL HIGH (ref 0.4–1.0)
B-Globulin SerPl Elph-Mcnc: 0.9 g/dL (ref 0.7–1.3)
Gamma Glob SerPl Elph-Mcnc: 1 g/dL (ref 0.4–1.8)
Globulin, Total: 3.4 g/dL (ref 2.2–3.9)
IgA: 508 mg/dL — ABNORMAL HIGH (ref 90–386)
IgG (Immunoglobin G), Serum: 778 mg/dL (ref 603–1613)
IgM (Immunoglobulin M), Srm: 42 mg/dL (ref 20–172)
M Protein SerPl Elph-Mcnc: 0.3 g/dL — ABNORMAL HIGH
Total Protein ELP: 5.7 g/dL — ABNORMAL LOW (ref 6.0–8.5)

## 2023-01-13 ENCOUNTER — Ambulatory Visit (HOSPITAL_COMMUNITY)
Admission: RE | Admit: 2023-01-13 | Discharge: 2023-01-13 | Disposition: A | Discharge status: Home / Self Care | Payer: 59 | Source: Ambulatory Visit | Attending: Hematology | Admitting: Hematology

## 2023-01-13 DIAGNOSIS — C9000 Multiple myeloma not having achieved remission: Secondary | ICD-10-CM | POA: Insufficient documentation

## 2023-01-13 DIAGNOSIS — I2721 Secondary pulmonary arterial hypertension: Secondary | ICD-10-CM | POA: Diagnosis not present

## 2023-01-13 DIAGNOSIS — R590 Localized enlarged lymph nodes: Secondary | ICD-10-CM | POA: Insufficient documentation

## 2023-01-13 DIAGNOSIS — I700 Atherosclerosis of aorta: Secondary | ICD-10-CM | POA: Diagnosis not present

## 2023-01-13 DIAGNOSIS — D4989 Neoplasm of unspecified behavior of other specified sites: Secondary | ICD-10-CM | POA: Diagnosis present

## 2023-01-13 DIAGNOSIS — R188 Other ascites: Secondary | ICD-10-CM | POA: Insufficient documentation

## 2023-01-13 LAB — GLUCOSE, CAPILLARY: Glucose-Capillary: 147 mg/dL — ABNORMAL HIGH (ref 70–99)

## 2023-01-13 MED ORDER — FLUDEOXYGLUCOSE F - 18 (FDG) INJECTION
13.0000 | Freq: Once | INTRAVENOUS | Status: AC | PRN
  Administered 2023-01-13: 13.02 via INTRAVENOUS

## 2023-01-14 ENCOUNTER — Other Ambulatory Visit: Payer: Self-pay | Admitting: Radiology

## 2023-01-14 DIAGNOSIS — D4989 Neoplasm of unspecified behavior of other specified sites: Secondary | ICD-10-CM

## 2023-01-14 NOTE — H&P (Incomplete)
Chief Complaint: Patient was seen in consultation today for No chief complaint on file.  at the request of Johney Maine  Referring Physician(s): Johney Maine  Supervising Physician: {Supervising Physician:[id number]}  Patient Status: [facility] - Out-pt  History of Present Illness: Jesse [name] is a 52 y.o. male with history of CHF, diabetes mellitus type 2, HLD, HTN, MI, sleep apnea, and obesity. Patient was sent to the ED 12/26/22 by his heart failure clinic with c/o fluid overload and CHF exacerbation. Cardiology was consulted, and cardiac MRI 12/4 was concerning for amyloidosis. Patient was referred to Dr. Johney Maine by cardiology for workup of possible amyloidosis.   Today, patient reports   Past Medical History:  Diagnosis Date   CHF (congestive heart failure) (HCC)    Coronary artery disease    Inferior myocardial infarction in 2004.  He was treated with thrombolytics at [facility].  He then had an inferior MI in March 2007.  He received a Horizon study stent 3.5 x 20 mm in the distal RCA at [facility].  No other obstructive disease.   Diabetes mellitus    TYPE II   Erectile dysfunction    Hyperlipidemia    Hypertension    MI (myocardial infarction) (HCC) 2956,2130   Morbid obesity (HCC)    Sleep apnea     Past Surgical History:  Procedure Laterality Date   CARDIAC CATHETERIZATION     @ [facility]   CHOLECYSTECTOMY     LEFT HEART CATH AND CORONARY ANGIOGRAPHY N/A 07/05/2016   Procedure: Left Heart Cath and Coronary Angiography;  Surgeon: Alwyn Pea, MD;  Location: [facility] INVASIVE CV LAB;  Service: Cardiovascular;  Laterality: N/A;   SEPTOPLASTY      Allergies: Patient has no known allergies.  Medications: Prior to Admission medications   Medication Sig Start Date End Date Taking? Authorizing Provider  aspirin EC 81 MG tablet Take 1 tablet (81 mg total) by mouth daily. Swallow whole. 12/31/22   Darlin Priestly, MD  clopidogrel (PLAVIX) 75 MG tablet Take  1 tablet (75 mg total) by mouth daily. 12/31/22   Darlin Priestly, MD  empagliflozin (JARDIANCE) 25 MG TABS tablet Take 1 tablet (25 mg total) by mouth daily. 12/31/22   Darlin Priestly, MD  metoprolol succinate (TOPROL-XL) 25 MG 24 hr tablet Take 0.5 tablets (12.5 mg total) by mouth daily. Take with or immediately following a meal. 11/19/22 08/16/23  Clarisa Kindred A, FNP  midodrine (PROAMATINE) 5 MG tablet Take 1 tablet (5 mg total) by mouth 3 (three) times daily with meals. 12/31/22   Darlin Priestly, MD  nitroGLYCERIN (NITROSTAT) 0.4 MG SL tablet Place 1 tablet (0.4 mg total) under the tongue every 5 (five) minutes as needed for chest pain. 02/28/17   Adrian Saran, MD  potassium chloride SA (KLOR-CON M) 20 MEQ tablet Take 2 tablets (40 mEq total) by mouth daily. 01/09/23   Sabharwal, Aditya, DO  rosuvastatin (CRESTOR) 20 MG tablet Take 20 mg by mouth daily. 12/10/22 12/10/23  [provider]  spironolactone (ALDACTONE) 25 MG tablet Take 0.5 tablets (12.5 mg total) by mouth daily. 12/31/22   Darlin Priestly, MD  torsemide (DEMADEX) 20 MG tablet INCREASE TORSEMIDE TO 80MG  IN THE MORNING AND 60MG  IN THE EVENING FOR 5-6 DAYS AFTER 5-6 DAYS PLEASE DECREASE  TORSEMIDE TO 60MG  IN THE MORNING AND 40MG  IN THE EVENING THEREAFTER 01/09/23   Sabharwal, Aditya, DO  traMADol (ULTRAM) 50 MG tablet Take 50 mg by mouth every 6 (six) hours as needed  for pain. 02/13/17   [provider]     Family History  Adopted: Yes    Social History   Socioeconomic History   Marital status: Married    Spouse name: Not on file   Number of children: Not on file   Years of education: Not on file   Highest education level: Not on file  Occupational History   Not on file  Tobacco Use   Smoking status: Every Day    Current packs/day: 0.00    Average packs/day: 1 pack/day for 15.0 years (15.0 ttl pk-yrs)    Types: Cigars, Cigarettes    Start date: 2005    Last attempt to quit: 2020    Years since quitting: 4.9   Smokeless  tobacco: Never  Vaping Use   Vaping status: Never Used  Substance and Sexual Activity   Alcohol use: No   Drug use: No    Comment: prior hx in his 20's of cocaine and marijuana   Sexual activity: Not on file  Other Topics Concern   Not on file  Social History Narrative   Not on file   Social Drivers of Health   Financial Resource Strain: Not on file  Food Insecurity: No Food Insecurity (12/27/2022)   Hunger Vital Sign    Worried About Running Out of Food in the Last Year: Never true    Ran Out of Food in the Last Year: Never true  Transportation Needs: No Transportation Needs (12/27/2022)   PRAPARE - Administrator, Civil Service (Medical): No    Lack of Transportation (Non-Medical): No  Physical Activity: Not on file  Stress: Not on file  Social Connections: Not on file   Review of Systems: A 12 point ROS discussed and pertinent positives are indicated in the HPI above.  All other systems are negative.  Review of Systems  Vital Signs: There were no vitals taken for this visit.    Physical Exam  Imaging: MR FEMUR LEFT W WO CONTRAST Result Date: 12/28/2022 CLINICAL DATA:  Bone lesion, femur, incidental. Palpable lump in the mid to distal left thigh for 2-3 months. History of multiple myeloma. EXAM: MR OF THE LEFT LOWER EXTREMITY WITHOUT AND WITH CONTRAST TECHNIQUE: Multiplanar, multisequence MR imaging of the left thigh was performed both before and after administration of intravenous contrast. CONTRAST:  10mL GADAVIST GADOBUTROL 1 MMOL/ML IV SOLN COMPARISON:  Lower extremity venous Doppler ultrasound 04/14/2022. Bone survey 12/27/2022. FINDINGS: Bones/Joint/Cartilage Both thighs are included on the coronal images. There is a broad-based exostosis involving the anterior aspect of the distal left femur, corresponding with the radiographic abnormality. This exostosis measures approximately 2.6 x 1.8 x 6.3 cm and is consistent with an incidental osteochondroma. No  associated marrow enhancement, cartilaginous cap thickening or pseudo bursal formation. The left femur otherwise appears unremarkable. There are no suspicious osseous lesions. No evidence of acute fracture, dislocation or femoral head osteonecrosis. No significant arthropathy or effusions identified at either knee or hip. Ligaments No gross ligamentous abnormalities on large field-of-view imaging. Muscles and Tendons Mild left common hamstring tendinosis without tear. No focal muscular abnormalities are identified within the left thigh. Mild asymmetric T2 hyperintensity demonstrated within the right thigh musculature, greatest within the vastus lateralis. Soft tissues At least moderate diffuse subcutaneous edema is noted within both thighs. This appears fairly symmetric on the coronal images. No focal fluid collection or suspicious soft tissue enhancement identified. The left testis appears retracted into the left inguinal canal. This appears  changed from pelvic CT 03/06/2019 and therefore not consistent with an incompletely descended testicle. Correlate clinically. IMPRESSION: 1. The palpable abnormality in the distal left thigh corresponds with an incidental osteochondroma. No suspicious osseous lesions identified. 2. At least moderate diffuse subcutaneous edema within both thighs, nonspecific. No focal fluid collection or suspicious soft tissue enhancement identified. 3. Mild asymmetric T2 hyperintensity within the right thigh musculature, greatest within the vastus lateralis. This is nonspecific and could be secondary to subacute denervation, venous stasis or myositis. 4. The left testis appears retracted into the left inguinal canal. Correlate clinically. Electronically Signed   By: Carey Bullocks M.D.   On: 12/28/2022 08:43   DG Bone Survey Met Result Date: 12/27/2022 CLINICAL DATA:  Multiple myeloma, posterior neck pain around C7, lump on left distal femur x2 months EXAM: METASTATIC BONE SURVEY  COMPARISON:  None Available. FINDINGS: No lytic lesion in the calvarium. Bilateral upper extremities are within normal limits. Mild degenerative changes of the lower cervical spine, without lytic lesion. Thoracic spine is within normal limits. Mild degenerative changes of the upper lumbar spine, without lytic lesion. Lungs are clear.  The heart is normal in size. No lytic lesion in the bony pelvis. Right lower extremity is within normal limits. In the left lower extremity, there is an 8.5 cm mixed/sclerotic lesion in the lateral distal femoral shaft with overlying cortical thickening/periosteal reaction, with a benign appearance. This appearance favors a nonaggressive lesions such as nonossifying fibroma or less likely enchondroma. Multiple myeloma is considered unlikely. IMPRESSION: 8.5 cm mixed/sclerotic lesion in the lateral distal left femoral shaft, favoring a nonaggressive lesion such as nonossifying fibroma or less likely enchondroma. Multiple myeloma is considered unlikely. Otherwise negative multiple myeloma. Follow-up orthopedic consultation is suggested with dedicated femur radiographs prior to possible MR. Electronically Signed   By: Charline Bills M.D.   On: 12/27/2022 17:20   ECHOCARDIOGRAM COMPLETE Result Date: 12/27/2022    ECHOCARDIOGRAM REPORT   Patient Name:   Jesse [name] Date of Exam: 12/27/2022 Medical Rec #:  [id number]        Height:       71.0 in Accession #:    [id number]       Weight:       281.0 lb Date of Birth:  1970-10-25        BSA:          2.436 m Patient Age:    52 years         BP:           98/73 mmHg Patient Gender: M                HR:           81 bpm. Exam Location:  [facility] Procedure: 2D Echo, 3D Echo, Cardiac Doppler, Color Doppler and Strain Analysis Indications:     CHF  History:         Patient has prior history of Echocardiogram examinations. CHF                  and Cardiomyopathy, CAD and Previous Myocardial Infarction,                  Signs/Symptoms:Dyspnea;  Risk Factors:Hypertension, Diabetes,                  Dyslipidemia and Sleep Apnea.  Sonographer:     Mikki Harbor Referring Phys:  [id number] Andris Baumann Diagnosing Phys: Clotilde Dieter  Sonographer Comments: Patient  is obese. Global longitudinal strain was attempted. IMPRESSIONS  1. Left ventricular ejection fraction, by estimation, is 35 to 40%. The left ventricle has moderately decreased function. The left ventricle demonstrates global hypokinesis. Left ventricular diastolic parameters are consistent with Grade I diastolic dysfunction (impaired relaxation).  2. Right ventricular systolic function is low normal. The right ventricular size is moderately enlarged. There is normal pulmonary artery systolic pressure. The estimated right ventricular systolic pressure is 29.2 mmHg.  3. Left atrial size was moderately dilated.  4. Right atrial size was mildly dilated.  5. The mitral valve is degenerative. Moderate mitral valve regurgitation.  6. Tricuspid valve regurgitation is moderate.  7. The aortic valve is normal in structure. Aortic valve regurgitation is not visualized. No aortic stenosis is present. FINDINGS  Left Ventricle: Left ventricular ejection fraction, by estimation, is 35 to 40%. The left ventricle has moderately decreased function. The left ventricle demonstrates global hypokinesis. The left ventricular internal cavity size was normal in size. There is no left ventricular hypertrophy. Left ventricular diastolic parameters are consistent with Grade I diastolic dysfunction (impaired relaxation). Right Ventricle: The right ventricular size is moderately enlarged. No increase in right ventricular wall thickness. Right ventricular systolic function is low normal. There is normal pulmonary artery systolic pressure. The tricuspid regurgitant velocity  is 2.30 m/s, and with an assumed right atrial pressure of 8 mmHg, the estimated right ventricular systolic pressure is 29.2 mmHg. Left Atrium: Left atrial  size was moderately dilated. Right Atrium: Right atrial size was mildly dilated. Pericardium: There is no evidence of pericardial effusion. Mitral Valve: The mitral valve is degenerative in appearance. Moderate mitral valve regurgitation. MV peak gradient, 8.0 mmHg. The mean mitral valve gradient is 3.0 mmHg. Tricuspid Valve: The tricuspid valve is grossly normal. Tricuspid valve regurgitation is moderate. Aortic Valve: The aortic valve is normal in structure. Aortic valve regurgitation is not visualized. No aortic stenosis is present. Aortic valve mean gradient measures 2.0 mmHg. Aortic valve peak gradient measures 3.1 mmHg. Aortic valve area, by VTI measures 2.15 cm. Pulmonic Valve: The pulmonic valve was grossly normal. Pulmonic valve regurgitation is not visualized. Aorta: The aortic root is normal in size and structure. IAS/Shunts: No atrial level shunt detected by color flow Doppler.  LEFT VENTRICLE PLAX 2D LVIDd:         5.30 cm LVIDs:         4.10 cm LV PW:         1.40 cm LV IVS:        1.40 cm LVOT diam:     2.20 cm LV SV:         36 LV SV Index:   15 LVOT Area:     3.80 cm  LV Volumes (MOD) LV vol d, MOD A2C: 98.6 ml LV vol d, MOD A4C: 117.0 ml LV vol s, MOD A2C: 71.0 ml LV vol s, MOD A4C: 73.1 ml LV SV MOD A2C:     27.6 ml LV SV MOD A4C:     117.0 ml LV SV MOD BP:      34.9 ml RIGHT VENTRICLE RV Basal diam:  4.05 cm RV Mid diam:    3.80 cm RV S prime:     9.34 cm/s LEFT ATRIUM             Index        RIGHT ATRIUM           Index LA diam:  5.20 cm 2.13 cm/m   RA Area:     22.10 cm LA Vol (A2C):   83.8 ml 34.40 ml/m  RA Volume:   67.50 ml  27.70 ml/m LA Vol (A4C):   93.6 ml 38.42 ml/m LA Biplane Vol: 95.8 ml 39.32 ml/m  AORTIC VALVE                    PULMONIC VALVE AV Area (Vmax):    2.90 cm     PV Vmax:       0.66 m/s AV Area (Vmean):   2.23 cm     PV Peak grad:  1.7 mmHg AV Area (VTI):     2.15 cm AV Vmax:           87.70 cm/s AV Vmean:          63.300 cm/s AV VTI:            0.168 m  AV Peak Grad:      3.1 mmHg AV Mean Grad:      2.0 mmHg LVOT Vmax:         67.00 cm/s LVOT Vmean:        37.100 cm/s LVOT VTI:          0.095 m LVOT/AV VTI ratio: 0.56  AORTA Ao Root diam: 3.80 cm MITRAL VALVE                TRICUSPID VALVE MV Area (PHT): 3.99 cm     TR Peak grad:   21.2 mmHg MV Area VTI:   1.17 cm     TR Vmax:        230.00 cm/s MV Peak grad:  8.0 mmHg MV Mean grad:  3.0 mmHg     SHUNTS MV Vmax:       1.41 m/s     Systemic VTI:  0.09 m MV Vmean:      73.5 cm/s    Systemic Diam: 2.20 cm MV Decel Time: 190 msec MV E velocity: 136.00 cm/s Rozell Searing Custovic Electronically signed by Clotilde Dieter Signature Date/Time: 12/27/2022/1:22:46 PM    Final    DG Chest 2 View Result Date: 12/26/2022 CLINICAL DATA:  Chest pain. EXAM: CHEST - 2 VIEW COMPARISON:  05/30/2022. FINDINGS: Low lung volume. There are diffuse increased interstitial markings when compared to the prior exam. Bilateral lung fields are otherwise clear. No acute consolidation or lung collapse. Bilateral costophrenic angles are clear. Stable cardio-mediastinal silhouette. No acute osseous abnormalities. The soft tissues are within normal limits. IMPRESSION: *In appropriate clinical setting, findings favor mild congestive heart failure/pulmonary edema. Electronically Signed   By: Jules Schick M.D.   On: 12/26/2022 18:28   MR CARDIAC MORPHOLOGY W WO CONTRAST Result Date: 12/25/2022 CLINICAL DATA:  Cardiomyopathy, hx of CAD/PCI. Eval for infiltrative process. EXAM: CARDIAC MRI TECHNIQUE: The patient was scanned on a 1.5 Tesla Siemens magnet. A dedicated cardiac coil was used. Functional imaging was done using Fiesta sequences. 2,3, and 4 chamber views were done to assess for RWMA's. Modified Simpson's rule using a short axis stack was used to calculate an ejection fraction on a dedicated work [employer]. The patient received 15 cc of Gadavist. After 10 minutes inversion recovery sequences were used to assess for  infiltration and scar tissue. Velocity flow mapping performed in the ascending aorta and main pulmonary artery. CONTRAST:  15 cc  of Gadavist FINDINGS: 1. Mildly dilated left ventricular size, mild LV thickness, moderately reduced LV systolic  function (LVEF = 37%). There is global hypokinesis. There is diffuse midwall late gadolinium enhancement in the left ventricular myocardium. LVEDV: 235 ml LVESV: 149 ml SV: 86 ml CO: 5L/min Myocardial mass: 207g LV native T1 value 1104 ms (normal <1000 ms) LV ECV value 62%.  Normal <30%. 2. Normal right ventricular size, thickness, moderately reduced systolic function (RVEF = 30%). There is global hypokinesis. 3.  Mildly dilated left atrial size. normal right atrial size. 4. Normal size of the aortic root, ascending aorta and pulmonary artery. 5. Mild mitral regurgitation, trivial pulmonary regurgitation. No significant valvular abnormalities. 6.  Normal pericardium.  trace pericardial effusion. IMPRESSION: 1. Mildly dilated LV, moderately reduced LV systolic function. LVEF 37%. 2.  Diffuse midwall LGE/scar 3. Elevated native T1 and ECV values suggesting infiltrative disease. 4.  Moderately reduced RV function. 5.  Trivial pericardial effusion 6. Findings suggest infiltrative cardiomyopathy. Recommend complete workup for amyloidosis Electronically Signed   By: Debbe Odea M.D.   On: 12/25/2022 17:50   MR CARDIAC VELOCITY FLOW MAP Result Date: 12/25/2022 CLINICAL DATA:  Cardiomyopathy, hx of CAD/PCI. Eval for infiltrative process. EXAM: CARDIAC MRI TECHNIQUE: The patient was scanned on a 1.5 Tesla Siemens magnet. A dedicated cardiac coil was used. Functional imaging was done using Fiesta sequences. 2,3, and 4 chamber views were done to assess for RWMA's. Modified Simpson's rule using a short axis stack was used to calculate an ejection fraction on a dedicated work [employer], [employer]. The patient received 15 cc of Gadavist. After 10 minutes inversion recovery  sequences were used to assess for infiltration and scar tissue. Velocity flow mapping performed in the ascending aorta and main pulmonary artery. CONTRAST:  15 cc  of Gadavist FINDINGS: 1. Mildly dilated left ventricular size, mild LV thickness, moderately reduced LV systolic function (LVEF = 37%). There is global hypokinesis. There is diffuse midwall late gadolinium enhancement in the left ventricular myocardium. LVEDV: 235 ml LVESV: 149 ml SV: 86 ml CO: 5L/min Myocardial mass: 207g LV native T1 value 1104 ms (normal <1000 ms) LV ECV value 62%.  Normal <30%. 2. Normal right ventricular size, thickness, moderately reduced systolic function (RVEF = 30%). There is global hypokinesis. 3.  Mildly dilated left atrial size. normal right atrial size. 4. Normal size of the aortic root, ascending aorta and pulmonary artery. 5. Mild mitral regurgitation, trivial pulmonary regurgitation. No significant valvular abnormalities. 6.  Normal pericardium.  trace pericardial effusion. IMPRESSION: 1. Mildly dilated LV, moderately reduced LV systolic function. LVEF 37%. 2.  Diffuse midwall LGE/scar 3. Elevated native T1 and ECV values suggesting infiltrative disease. 4.  Moderately reduced RV function. 5.  Trivial pericardial effusion 6. Findings suggest infiltrative cardiomyopathy. Recommend complete workup for amyloidosis Electronically Signed   By: Debbe Odea M.D.   On: 12/25/2022 17:50   MR CARDIAC VELOCITY FLOW MAP Result Date: 12/25/2022 CLINICAL DATA:  Cardiomyopathy, hx of CAD/PCI. Eval for infiltrative process. EXAM: CARDIAC MRI TECHNIQUE: The patient was scanned on a 1.5 Tesla Siemens magnet. A dedicated cardiac coil was used. Functional imaging was done using Fiesta sequences. 2,3, and 4 chamber views were done to assess for RWMA's. Modified Simpson's rule using a short axis stack was used to calculate an ejection fraction on a dedicated work [employer] officer, [employer]. The patient received 15 cc of Gadavist.  After 10 minutes inversion recovery sequences were used to assess for infiltration and scar tissue. Velocity flow mapping performed in the ascending aorta and main pulmonary artery. CONTRAST:  15  cc  of Gadavist FINDINGS: 1. Mildly dilated left ventricular size, mild LV thickness, moderately reduced LV systolic function (LVEF = 37%). There is global hypokinesis. There is diffuse midwall late gadolinium enhancement in the left ventricular myocardium. LVEDV: 235 ml LVESV: 149 ml SV: 86 ml CO: 5L/min Myocardial mass: 207g LV native T1 value 1104 ms (normal <1000 ms) LV ECV value 62%.  Normal <30%. 2. Normal right ventricular size, thickness, moderately reduced systolic function (RVEF = 30%). There is global hypokinesis. 3.  Mildly dilated left atrial size. normal right atrial size. 4. Normal size of the aortic root, ascending aorta and pulmonary artery. 5. Mild mitral regurgitation, trivial pulmonary regurgitation. No significant valvular abnormalities. 6.  Normal pericardium.  trace pericardial effusion. IMPRESSION: 1. Mildly dilated LV, moderately reduced LV systolic function. LVEF 37%. 2.  Diffuse midwall LGE/scar 3. Elevated native T1 and ECV values suggesting infiltrative disease. 4.  Moderately reduced RV function. 5.  Trivial pericardial effusion 6. Findings suggest infiltrative cardiomyopathy. Recommend complete workup for amyloidosis Electronically Signed   By: Debbe Odea M.D.   On: 12/25/2022 17:50    Labs:  CBC: Recent Labs    12/29/22 0418 12/30/22 0650 12/31/22 0252 01/02/23 1146  WBC 9.6 9.2 9.3 10.0  HGB 15.6 15.0 15.3 16.3  HCT 46.7 44.5 45.9 48.6  PLT 468* 441* 463* 451*    COAGS: Recent Labs    12/26/22 1612  INR 1.1    BMP: Recent Labs    12/29/22 0418 12/30/22 0650 12/31/22 0252 01/02/23 1146  NA 132* 134* 136 139  K 3.1* 3.5 4.5 3.7  CL 91* 90* 90* 91*  CO2 28 31 35* 41*  GLUCOSE 140* 143* 142* 194*  BUN 37* 36* 36* 27*  CALCIUM 8.2* 8.2* 8.2* 9.1   CREATININE 1.21 1.32* 1.23 1.06  GFRNONAA >60 >60 >60 >60    LIVER FUNCTION TESTS: Recent Labs    03/19/22 2322 04/14/22 1336 05/30/22 1723 01/02/23 1146  BILITOT 0.8 1.0 0.6 0.4  AST 22 24 16 31   ALT 21 21 16 25   ALKPHOS 80 99 54 157*  PROT 6.6 7.2 4.4* 6.6  ALBUMIN 3.3* 3.3* 2.1* 2.9*    TUMOR MARKERS: No results for input(s): "AFPTM", "CEA", "CA199", "CHROMGRNA" in the last 8760 hours.  Assessment and Plan:  CAMRAN TIETJEN is a 52 y.o. male with history of CHF, diabetes mellitus type 2, HLD, HTN, MI, sleep apnea, and obesity. Patient was sent to the ED 12/26/22 by his heart failure clinic with c/o fluid overload and CHF exacerbation. Cardiology was consulted, and cardiac MRI 12/4 was concerning for amyloidosis. Patient was referred to Dr. Johney Maine by cardiology for workup of possible amyloidosis.   Today, patient reports   Risks and benefits of bone marrow biopsy was discussed with the patient and/or patient's family including, but not limited to bleeding, infection, damage to adjacent structures or low yield requiring additional tests.  All of the questions were answered and there is agreement to proceed.  Consent signed and in chart.  Thank you for this interesting consult.  I greatly enjoyed meeting Jesse [name] and look forward to participating in their care.  A copy of this report was sent to the requesting provider on this date.  Electronically Signed: Rosalita Levan, PA 01/14/2023, 11:46 AM   I spent a total of {New INPT:304952001} {New Out-Pt:[id number]}  {Established Out-Pt:[id number]} in face to face in clinical consultation, greater than 50% of which was counseling/coordinating care for ***

## 2023-01-16 ENCOUNTER — Ambulatory Visit (HOSPITAL_COMMUNITY)
Admission: RE | Admit: 2023-01-16 | Discharge: 2023-01-16 | Disposition: A | Discharge status: Home / Self Care | Payer: 59 | Source: Ambulatory Visit | Attending: Hematology | Admitting: Hematology

## 2023-01-16 ENCOUNTER — Ambulatory Visit (HOSPITAL_COMMUNITY): Payer: 59

## 2023-01-17 LAB — UPEP/UIFE/LIGHT CHAINS/TP, 24-HR UR
% BETA, Urine: 9.3 %
ALPHA 1 URINE: 8 %
Albumin, U: 77.2 %
Alpha 2, Urine: 1.9 %
Free Kappa Lt Chains,Ur: 42.98 mg/L (ref 1.17–86.46)
Free Kappa/Lambda Ratio: 0.2 — ABNORMAL LOW (ref 1.83–14.26)
Free Lambda Lt Chains,Ur: 216.9 mg/L — ABNORMAL HIGH (ref 0.27–15.21)
GAMMA GLOBULIN URINE: 3.6 %
M-SPIKE %, Urine: 1.3 % — ABNORMAL HIGH
M-Spike, Mg/24 Hr: 146 mg/(24.h) — ABNORMAL HIGH
Total Protein, Urine-Ur/day: 11247 mg/(24.h) — ABNORMAL HIGH (ref 30–150)
Total Protein, Urine: 424.4 mg/dL
Total Volume: 2650

## 2023-01-19 NOTE — Progress Notes (Signed)
PCP: Darreld Mclean, MD (last seen few months ago) Primary Cardiologist: Dorothyann Peng, MD (last seen 11/24; returns 02/25)  CC: AL amyloid with cardiac involvement  HPI:  Jesse [name] is a 52 y/o male with a history of CAD (MI 2004/ 2007), DM2, hyperlipidemia, HTN, OSA, tobacco use and chronic heart failure. Reports CAD with NSTEMI in 2004 and 2007. Had inferior STEMI in 6/18. Cath with LAD 20% LCx 50%d. OM4 75% RCA 100%m  -> PCI with 2 overlapping DES stents  Cardiac history dates back to 2004. Prior inferior MI in 2004 treated with thrombolytics and MI in 2007 treated with stenting of the RCA. Normal EF at that time. He was apparently placed on full disability at the time of one of these MIs for unclear reasons.   Echo 09/27/08: EF 60-65% with mild LAE Echo 07/05/16: EF 50-55% Echo 08/15/20: EF 45-50%  Was in the ED 03/19/22 due to SOB and weight gain. D dimer elevated so CTA done but no PE or pneumonia. Was in the ED 04/14/22 due to a/c HF. Ultrasound negative for DVT. Diuresed with improvement of symptoms. Was in the ED 05/30/22 due to a tick bite on his back. Found to be hypokalemic/ hypocalcemic. Given IV potassium, calcium gluconate and small fluid bolus.    Interval hx:  During his last clinic visit, Jesse [name] was evaluated by Dr. Gala Romney who directly admitted him to [facility] for volume overload and concern for rapidly progressing AL amyloid.  During his admission he was started on high-dose Lasix infusion at 30 mg/h with metolazone.  Cardiac MRI was notable for ECV of 62% concerning for AL amyloid.  After aggressive diuresis for 5 days, patient was discharged home on torsemide 40 mg twice daily and Farxiga 10 mg daily.  He had a skeletal survey that demonstrated no evidence of disease and was also started on midodrine 5 mg 3 times daily for blood pressure support.  Since discharge from the hospital, he continues to feel poorly.  He is here today with his wife who reports that he has once again has  worsening lower extremity edema, fatigue and shortness of breath with moderate to excessive exertion.  According to him, since discharge from the hospital he has been fairly weak and inactive.  He is planning to undergo bone marrow biopsy for definitive diagnosis of AL amyloid.  SH:  Social History   Socioeconomic History   Marital status: Married    Spouse name: Not on file   Number of children: Not on file   Years of education: Not on file   Highest education level: Not on file  Occupational History   Not on file  Tobacco Use   Smoking status: Every Day    Current packs/day: 0.00    Average packs/day: 1 pack/day for 15.0 years (15.0 ttl pk-yrs)    Types: Cigars, Cigarettes    Start date: 2005    Last attempt to quit: 2020    Years since quitting: 4.9   Smokeless tobacco: Never  Vaping Use   Vaping status: Never Used  Substance and Sexual Activity   Alcohol use: No   Drug use: No    Comment: prior hx in his 20's of cocaine and marijuana   Sexual activity: Not on file  Other Topics Concern   Not on file  Social History Narrative   Not on file   Social Drivers of Health   Financial Resource Strain: Not on file  Food Insecurity: No Food Insecurity (12/27/2022)  Hunger Vital Sign    Worried About Running Out of Food in the Last Year: Never true    Ran Out of Food in the Last Year: Never true  Transportation Needs: No Transportation Needs (12/27/2022)   PRAPARE - Administrator, Civil Service (Medical): No    Lack of Transportation (Non-Medical): No  Physical Activity: Not on file  Stress: Not on file  Social Connections: Not on file  Intimate Partner Violence: Not At Risk (12/27/2022)   Humiliation, Afraid, Rape, and Kick questionnaire    Fear of Current or Ex-Partner: No    Emotionally Abused: No    Physically Abused: No    Sexually Abused: No    FH:  Family History  Adopted: Yes    Past Medical History:  Diagnosis Date   CHF (congestive heart  failure) (HCC)    Coronary artery disease    Inferior myocardial infarction in 2004.  He was treated with thrombolytics at [facility].  He then had an inferior MI in March 2007.  He received a Horizon study stent 3.5 x 20 mm in the distal RCA at [facility].  No other obstructive disease.   Diabetes mellitus    TYPE II   Erectile dysfunction    Hyperlipidemia    Hypertension    MI (myocardial infarction) (HCC) 1610,9604   Morbid obesity (HCC)    Sleep apnea     Current Outpatient Medications  Medication Sig Dispense Refill   aspirin EC 81 MG tablet Take 1 tablet (81 mg total) by mouth daily. Swallow whole. 30 tablet 2   clopidogrel (PLAVIX) 75 MG tablet Take 1 tablet (75 mg total) by mouth daily. 30 tablet 2   empagliflozin (JARDIANCE) 25 MG TABS tablet Take 1 tablet (25 mg total) by mouth daily. 30 tablet 2   metoprolol succinate (TOPROL-XL) 25 MG 24 hr tablet Take 0.5 tablets (12.5 mg total) by mouth daily. Take with or immediately following a meal. 45 tablet 2   midodrine (PROAMATINE) 5 MG tablet Take 1 tablet (5 mg total) by mouth 3 (three) times daily with meals. 90 tablet 0   nitroGLYCERIN (NITROSTAT) 0.4 MG SL tablet Place 1 tablet (0.4 mg total) under the tongue every 5 (five) minutes as needed for chest pain. 30 tablet 0   rosuvastatin (CRESTOR) 20 MG tablet Take 20 mg by mouth daily.     spironolactone (ALDACTONE) 25 MG tablet Take 0.5 tablets (12.5 mg total) by mouth daily.     traMADol (ULTRAM) 50 MG tablet Take 50 mg by mouth every 6 (six) hours as needed for pain.     potassium chloride SA (KLOR-CON M) 20 MEQ tablet Take 2 tablets (40 mEq total) by mouth daily. 60 tablet 3   torsemide (DEMADEX) 20 MG tablet INCREASE TORSEMIDE TO 80MG  IN THE MORNING AND 60MG  IN THE EVENING FOR 5-6 DAYS AFTER 5-6 DAYS PLEASE DECREASE  TORSEMIDE TO 60MG  IN THE MORNING AND 40MG  IN THE EVENING THEREAFTER 190 tablet 3   No current facility-administered medications for this visit.   Vitals:   01/09/23  1110  BP: 99/66  Pulse: 83  SpO2: 100%  Weight: 261 lb (118.4 kg)  Height: 5\' 11"  (1.803 m)   Wt Readings from Last 3 Encounters:  01/09/23 261 lb (118.4 kg)  01/02/23 253 lb 3.2 oz (114.9 kg)  12/31/22 257 lb 15 oz (117 kg)   Lab Results  Component Value Date   CREATININE 1.06 01/02/2023   CREATININE 1.23 12/31/2022  CREATININE 1.32 (H) 12/30/2022   PHYSICAL EXAM: Vitals:   01/09/23 1110  BP: 99/66  Pulse: 83  SpO2: 100%   GENERAL: Ill-appearing male, fatigued HEENT: Negative for arcus senilis or xanthelasma. There is no scleral icterus.  The mucous membranes are pink and moist.   NECK: Supple, No masses. Normal carotid upstrokes without bruits. No masses or thyromegaly.    CHEST: There are no chest wall deformities. There is no chest wall tenderness. Respirations are unlabored.  Lungs-mild crackles at bases CARDIAC:  JVP: Difficult to assess but appears elevated to at least 10 cm         Normal rate with regular rhythm. No murmurs, rubs or gallops.  Pulses are 2+ and symmetrical in upper and lower extremities.  2-3+ pitting edema.  ABDOMEN: Soft, non-tender, non-distended. There are no masses or hepatomegaly. There are normal bowel sounds.  EXTREMITIES: Warm and well perfused with no cyanosis, clubbing.  LYMPHATIC: No axillary or supraclavicular lymphadenopathy.  NEUROLOGIC: Patient is oriented x3 with no focal or lateralizing neurologic deficits.  PSYCH: Patients affect is appropriate, there is no evidence of anxiety or depression.  SKIN: Warm and dry; no lesions or wounds.    Cardiac Studies Stress test 07/2020: Abnormal myocardial perfusion scan. Mildly depressed left ventricular function mild inferior hypoejection fraction 45% there is evidence of what appears to be fixed defect in the inferior segment but no evidence of reversibility to segment mid represent scar versus artifact.   cMRI 12/25/22: 1. Mildly dilated LV, moderately reduced LV systolic function. LVEF  37%.  2.  Diffuse midwall LGE/scar 3. Elevated native T1 and ECV values suggesting infiltrative disease. 4. Moderately reduced RV function. 5.Trivial pericardial effusion 6. Findings suggest infiltrative cardiomyopathy. Recommend complete workup for amyloidosis    LHC 07/05/16: Mid RCA to Dist RCA lesion, 100 %stenosed. RPDA lesion, 90 %stenosed. Dist Cx lesion, 50 %stenosed. 4th Mrg lesion, 75 %stenosed. Mid LAD to Dist LAD lesion, 20 %stenosed. A STENT XIENCE ALPINE RX 3.0X23 drug eluting stent was successfully placed, and overlaps previously placed stent. A STENT XIENCE ALPINE RX [id number] stent was successfully placed. Dist RCA lesion, 100 %stenosed. Post intervention, there is a 0% residual stenosis. There is mild left ventricular systolic dysfunction. LV end diastolic pressure is normal. The left ventricular ejection fraction is 45-50% by visual estimate.  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 Cath with LAD 20% LCx 50%d. OM4 75% RCA 100%m  -> PCI with 2 overlapping DES stents - Echo 09/27/08: EF 60-65% with mild LAE - Echo 07/05/16: EF 50-55% - Echo 08/15/20 EF 45-50% - Echo 04/17/22 EF 35-40% moderate LVH RV low normal - Echo 12/27/22: LVEF 35%-40%, low normal RV function.  - cMRI 12/25/22: LVEF 37%. Diffuse midwall LGE/scar Elevated native T1 and ECV values suggesting infiltrative disease. ECV 62%Moderately reduced RV function. - Admitted in early 12/24 for several days due to volume overload; required high dose IV lasix gtt for adequate diuresis. Today he is once more volume  overloaded with 2-3+ pitting edema to the knees. - Increase torsemide to 80mg  in the morning, 60mg  at night for the  next 5 days. Will decrease to 60 in the AM and 40 in the PM afterwards. Instructed extensively on torsemide dosing based on daily weights, LE edema and dyspnea. Instructed to call us if edema does not resolve over the next several days.   2. Cardiac amyloid - CMR from 12/25/22 with LVEF of 37%, elevated ECV / T1 likely due to cardiac amyloid - Kappa/Lambda ratio of 0.13 & 0.12 on repeat assessments concerning for AL amyloid.  - Seen by Dr. Candise Che (heme/onc) on 01/02/23 with plan for PET/CT, CT bone marrow aspiration / biopsy.  - PET scan results from 01/13/23 pending  3: HTN- - Now on midodrine 5mg  TID - Repeat BMP/BNP today  4. OSA- - Wearing bipap  5. DM2 - A1c 7.3% in 3/24 (primary care) - jardiance 25mg  daily   6: CAD  - saw cardiology Juliann Pares) 11/24 - continue clopidogrel 75mg  daily - lipitor has been stopped and will start crestor 01/25 - LDL 11/19/22 was 172 - Cath 06/2016 with LAD 20% LCx 50%d. OM4 75% RCA 100%m  -> PCI with 2 overlapping DES stents - No reported chest pain  7. Tobacco use - Discussed importance of cessation from tobacco use - Reports that he has not smoked since discharge from hospital  8: Leg weakness- - sees vascular (Schnier) 12/24 - lipitor has been stopped and will start crestor 01/25  I spent 45 minutes caring for this patient today including face to face time, ordering and reviewing labs, reviewing records from Dr. Candise Che on 01/02/23, hospitalization at [facility] on 12/26/22, discussing prognosis of AL amyloid with cardiac involvement extensively, counseling on smoking cessation, seeing the patient, documenting in the record, and arranging follow ups.

## 2023-01-20 ENCOUNTER — Ambulatory Visit: Payer: 59 | Attending: Cardiology | Admitting: Cardiology

## 2023-01-20 ENCOUNTER — Encounter: Payer: Self-pay | Admitting: Cardiology

## 2023-01-20 VITALS — BP 107/81 | HR 88 | Wt 273.0 lb

## 2023-01-20 DIAGNOSIS — E8581 Light chain (AL) amyloidosis: Secondary | ICD-10-CM

## 2023-01-20 DIAGNOSIS — I959 Hypotension, unspecified: Secondary | ICD-10-CM

## 2023-01-20 DIAGNOSIS — I5023 Acute on chronic systolic (congestive) heart failure: Secondary | ICD-10-CM

## 2023-01-20 MED ORDER — METOLAZONE 2.5 MG PO TABS: ORAL_TABLET | ORAL | 0 refills | Status: AC

## 2023-01-20 NOTE — Progress Notes (Signed)
PCP: Darreld Mclean, MD (last seen few months ago) Primary Cardiologist: Dorothyann Peng, MD (last seen 11/24; returns 02/25)  CC: AL amyloid with cardiac involvement  HPI:  Jesse [name] is a 52 y/o male with a history of CAD (MI 2004/ 2007), DM2, hyperlipidemia, HTN, OSA, tobacco use and chronic heart failure. Reports CAD with NSTEMI in 2004 and 2007. Had inferior STEMI in 6/18. Cath with LAD 20% LCx 50%d. OM4 75% RCA 100%m  -> PCI with 2 overlapping DES stents  Cardiac history dates back to 2004. Prior inferior MI in 2004 treated with thrombolytics and MI in 2007 treated with stenting of the RCA. Normal EF at that time. He was apparently placed on full disability at the time of one of these MIs for unclear reasons.   Echo 09/27/08: EF 60-65% with mild LAE Echo 07/05/16: EF 50-55% Echo 08/15/20: EF 45-50%  Was in the ED 03/19/22 due to SOB and weight gain. D dimer elevated so CTA done but no PE or pneumonia. Was in the ED 04/14/22 due to a/c HF. Ultrasound negative for DVT. Diuresed with improvement of symptoms. Was in the ED 05/30/22 due to a tick bite on his back. Found to be hypokalemic/ hypocalcemic. Given IV potassium, calcium gluconate and small fluid bolus.    Interval hx:  During his last clinic appointment, Jesse. Rabon was severely volume overloaded with persistent fatigue and shortness of breath.  He had 3+ pitting edema to the thighs.  At that time we increased torsemide to 80 in the morning and 60 mg at night.  Although his weight has remained the same, his lower extremity edema has improved.  Despite this he continues to have severe abdominal protuberance and still remains short of breath.  In addition, he missed his repeat lab draws last week and did not go to his bone marrow biopsy due to anxiety.  SH:  Social History   Socioeconomic History   Marital status: Married    Spouse name: Not on file   Number of children: Not on file   Years of education: Not on file   Highest education  level: Not on file  Occupational History   Not on file  Tobacco Use   Smoking status: Every Day    Current packs/day: 0.00    Average packs/day: 1 pack/day for 15.0 years (15.0 ttl pk-yrs)    Types: Cigars, Cigarettes    Start date: 2005    Last attempt to quit: 2020    Years since quitting: 5.0   Smokeless tobacco: Never  Vaping Use   Vaping status: Never Used  Substance and Sexual Activity   Alcohol use: No   Drug use: No    Comment: prior hx in his 20's of cocaine and marijuana   Sexual activity: Not on file  Other Topics Concern   Not on file  Social History Narrative   Not on file   Social Drivers of Health   Financial Resource Strain: Not on file  Food Insecurity: No Food Insecurity (12/27/2022)   Hunger Vital Sign    Worried About Running Out of Food in the Last Year: Never true    Ran Out of Food in the Last Year: Never true  Transportation Needs: No Transportation Needs (12/27/2022)   PRAPARE - Administrator, Civil Service (Medical): No    Lack of Transportation (Non-Medical): No  Physical Activity: Not on file  Stress: Not on file  Social Connections: Not on file  Intimate Partner  Violence: Not At Risk (12/27/2022)   Humiliation, Afraid, Rape, and Kick questionnaire    Fear of Current or Ex-Partner: No    Emotionally Abused: No    Physically Abused: No    Sexually Abused: No    FH:  Family History  Adopted: Yes    Past Medical History:  Diagnosis Date   CHF (congestive heart failure) (HCC)    Coronary artery disease    Inferior myocardial infarction in 2004.  He was treated with thrombolytics at [facility].  He then had an inferior MI in March 2007.  He received a Horizon study stent 3.5 x 20 mm in the distal RCA at [facility].  No other obstructive disease.   Diabetes mellitus    TYPE II   Erectile dysfunction    Hyperlipidemia    Hypertension    MI (myocardial infarction) (HCC) [id number]   Morbid obesity (HCC)    Sleep apnea     Current  Outpatient Medications  Medication Sig Dispense Refill   aspirin EC 81 MG tablet Take 1 tablet (81 mg total) by mouth daily. Swallow whole. 30 tablet 2   clopidogrel (PLAVIX) 75 MG tablet Take 1 tablet (75 mg total) by mouth daily. 30 tablet 2   empagliflozin (JARDIANCE) 25 MG TABS tablet Take 1 tablet (25 mg total) by mouth daily. 30 tablet 2   ibuprofen (ADVIL) 800 MG tablet Take 800 mg by mouth every 6 (six) hours as needed.     metoprolol succinate (TOPROL-XL) 25 MG 24 hr tablet Take 0.5 tablets (12.5 mg total) by mouth daily. Take with or immediately following a meal. 45 tablet 2   midodrine (PROAMATINE) 5 MG tablet Take 1 tablet (5 mg total) by mouth 3 (three) times daily with meals. 90 tablet 0   nitroGLYCERIN (NITROSTAT) 0.4 MG SL tablet Place 1 tablet (0.4 mg total) under the tongue every 5 (five) minutes as needed for chest pain. 30 tablet 0   potassium chloride SA (KLOR-CON M) 20 MEQ tablet Take 2 tablets (40 mEq total) by mouth daily. 60 tablet 3   rosuvastatin (CRESTOR) 20 MG tablet Take 20 mg by mouth daily.     spironolactone (ALDACTONE) 25 MG tablet Take 0.5 tablets (12.5 mg total) by mouth daily.     torsemide (DEMADEX) 20 MG tablet INCREASE TORSEMIDE TO 80MG  IN THE MORNING AND 60MG  IN THE EVENING FOR 5-6 DAYS AFTER 5-6 DAYS PLEASE DECREASE  TORSEMIDE TO 60MG  IN THE MORNING AND 40MG  IN THE EVENING THEREAFTER 190 tablet 3   traMADol (ULTRAM) 50 MG tablet Take 50 mg by mouth every 6 (six) hours as needed for pain.     No current facility-administered medications for this visit.   Vitals:   01/20/23 1057  BP: 107/81  Pulse: 88  SpO2: 100%  Weight: 273 lb (123.8 kg)   Wt Readings from Last 3 Encounters:  01/20/23 273 lb (123.8 kg)  01/09/23 261 lb (118.4 kg)  01/02/23 253 lb 3.2 oz (114.9 kg)   Lab Results  Component Value Date   CREATININE 1.06 01/02/2023   CREATININE 1.23 12/31/2022   CREATININE 1.32 (H) 12/30/2022   PHYSICAL EXAM: Vitals:   01/20/23 1057  BP:  107/81  Pulse: 88  SpO2: 100%   GENERAL: Well nourished, well developed, and in no apparent distress at rest.  HEENT: There is no scleral icterus.  The mucous membranes are pink and moist.   CHEST: There are no chest wall deformities. There is no chest wall  tenderness. Respirations are unlabored.  Lungs-CTA bilaterally CARDIAC:  JVP: 12 cm          Normal rate with regular rhythm.  No murmur.  Pulses are 2+ and symmetrical in upper and lower extremities. +2-3 edema.  ABDOMEN: Soft, non-tender, non-distended. There are normal bowel sounds.  EXTREMITIES: Warm and well perfused.  NEUROLOGIC: Patient is oriented x3 with no obvious focal neurologic deficits.  PSYCH: Patients affect is appropriate SKIN: Warm and dry; no lesions or wounds.     Cardiac Studies Stress test 07/2020: Abnormal myocardial perfusion scan. Mildly depressed left ventricular function mild inferior hypoejection fraction 45% there is evidence of what appears to be fixed defect in the inferior segment but no evidence of reversibility to segment mid represent scar versus artifact.   cMRI 12/25/22: 1. Mildly dilated LV, moderately reduced LV systolic function. LVEF 37%.  2.  Diffuse midwall LGE/scar 3. Elevated native T1 and ECV values suggesting infiltrative disease. 4. Moderately reduced RV function. 5.Trivial pericardial effusion 6. Findings suggest infiltrative cardiomyopathy. Recommend complete workup for amyloidosis    LHC 07/05/16: Mid RCA to Dist RCA lesion, 100 %stenosed. RPDA lesion, 90 %stenosed. Dist Cx lesion, 50 %stenosed. 4th Mrg lesion, 75 %stenosed. Mid LAD to Dist LAD lesion, 20 %stenosed. A STENT XIENCE ALPINE RX 3.0X23 drug eluting stent was successfully placed, and overlaps previously placed stent. A STENT XIENCE ALPINE RX [id number] stent was successfully placed. Dist RCA lesion, 100 %stenosed. Post intervention, there is a 0% residual stenosis. There is mild left ventricular systolic  dysfunction. LV end diastolic pressure is normal. The left ventricular ejection fraction is 45-50% by visual estimate.  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 Cath with LAD 20% LCx 50%d. OM4 75% RCA 100%m  -> PCI with 2 overlapping DES stents - Echo 09/27/08: EF 60-65% with mild LAE - Echo 07/05/16: EF 50-55% - Echo 08/15/20 EF 45-50% - Echo 04/17/22 EF 35-40% moderate LVH RV low normal - Echo 12/27/22: LVEF 35%-40%, low normal RV function.  - cMRI 12/25/22: LVEF 37%. Diffuse midwall LGE/scar Elevated native T1 and ECV values suggesting infiltrative disease. ECV 62%Moderately reduced RV function. - Last week he was severely volume overloaded, we increased his torsemide to 80mg  in the morning and 60 at night. He has had some improvement in LE edema, however, remains volume overloaded. We will continue torsemide 80mg  x 7 days with metolazone 5mg  today and then 2.5 daily for the next 3 days. His weight also remains elevated at 273 lbs (baseline weight in the 250s).  - We had repeat labs scheduled last week, however, he missed the appointment.  Repeat BMP/BNP today. -Follow-up in 1 week.  2. Cardiac amyloid - CMR from 12/25/22 with LVEF of 37%, elevated ECV / T1 likely due to cardiac amyloid - Kappa/Lambda ratio of 0.13 & 0.12 on repeat assessments concerning for AL amyloid.  - Seen by Dr. Candise Che (heme/onc) on 01/02/23 with plan for PET/CT, CT bone marrow aspiration / biopsy.  - PET scan results from 01/13/23 pending - He was very anxious prior to his bone marrow biopsy and canceled. It is now  scheduled for the end of January 2025.   3: Hypotension - Now on midodrine 5mg  TID -Repeat labs today.  Lightheadedness has improved.  4. OSA- - Wearing bipap  5. DM2 - A1c 7.3% in 3/24 (primary care) - jardiance 25mg  daily   6: CAD  - saw cardiology Juliann Pares) 11/24 - continue clopidogrel 75mg  daily - lipitor has been stopped and will start crestor 01/25 - LDL 11/19/22 was 172 - Cath 06/2016 with LAD 20% LCx 50%d. OM4 75% RCA 100%m  -> PCI with 2 overlapping DES stents - No reported chest pain  7. Tobacco use - Discussed importance of cessation from tobacco use - Reports that he has not smoked since discharge from hospital  8: Leg weakness- - sees vascular (Schnier) 12/24 - lipitor has been stopped and will start crestor 01/25  I spent 35 minutes caring for this patient today including face to face time, ordering and reviewing labs, discussing importance of bone marrow bx, appropriate follow up, seeing the patient, documenting in the record, and arranging follow ups.

## 2023-01-20 NOTE — Patient Instructions (Signed)
Medication Changes:  INCREASE Torsemide to 80 mg TWICE daily for 7 days, and then resume normal dosage.  Take Metolazone 5 mg today, and then 2.5 mg once daily for the next 3 days after today.  INCREASE Potassium to 60 meq daily this week and then resume normal dosage.  Lab Work:  Labs done today, your results will be available in MyChart, we will contact you for abnormal readings.    Special Instructions // Education:  Do the following things EVERYDAY: Weigh yourself in the morning before breakfast. Write it down and keep it in a log. Take your medicines as prescribed Eat low salt foods--Limit salt (sodium) to 2000 mg per day.  Stay as active as you can everyday Limit all fluids for the day to less than 2 liters   Follow-Up in: 1 week with Dr. Shirlee Latch    If you have any questions or concerns before your next appointment please send [place] a message through [facility] or call our office at [phone] Monday-Friday 8 am-5 pm.   If you have an urgent need after hours on the weekend please call your Primary Cardiologist or the [facility] in [place] at [phone].   At the [facility], you and your health needs are our priority. We have a designated team specialized in the treatment of Heart Failure. This Care Team includes your primary Heart Failure Specialized Cardiologist (physician), Advanced Practice Providers (APPs- Physician Assistants and Nurse Practitioners), and Pharmacist who all work together to provide you with the care you need, when you need it.   You may see any of the following providers on your designated Care Team at your next follow up:  Dr. Arvilla Meres Dr. Marca Ancona Dr. Dorthula Nettles Dr. Theresia Bough Tonye Becket, NP Robbie Lis, [place] [address] [place], [place] Brynda Peon, NP [place], NP Clarisa Kindred, NP Enos Fling, PharmD

## 2023-01-22 DEATH — deceased

## 2023-01-23 LAB — BASIC METABOLIC PANEL
BUN/Creatinine Ratio: 24 — ABNORMAL HIGH (ref 9–20)
BUN: 26 mg/dL — ABNORMAL HIGH (ref 6–24)
CO2: 31 mmol/L — ABNORMAL HIGH (ref 20–29)
Calcium: 9.1 mg/dL (ref 8.7–10.2)
Chloride: 95 mmol/L — ABNORMAL LOW (ref 96–106)
Creatinine, Ser: 1.1 mg/dL (ref 0.76–1.27)
Glucose: 179 mg/dL — ABNORMAL HIGH (ref 70–99)
Potassium: 4.3 mmol/L (ref 3.5–5.2)
Sodium: 140 mmol/L (ref 134–144)
eGFR: 81 mL/min/{1.73_m2} (ref >59)

## 2023-01-23 LAB — BRAIN NATRIURETIC PEPTIDE: BNP: 757.5 pg/mL — ABNORMAL HIGH (ref 0.0–100.0)

## 2023-01-30 ENCOUNTER — Encounter: Payer: 59 | Admitting: Cardiology

## 2023-02-03 ENCOUNTER — Encounter: Payer: Self-pay | Admitting: Hematology

## 2023-02-06 ENCOUNTER — Ambulatory Visit (HOSPITAL_COMMUNITY): Payer: 59

## 2023-02-17 ENCOUNTER — Other Ambulatory Visit: Payer: 59

## 2023-02-17 ENCOUNTER — Ambulatory Visit: Payer: 59 | Admitting: Hematology

## 2023-03-11 ENCOUNTER — Telehealth: Payer: 59 | Admitting: Cardiology
